# Patient Record
Sex: Female | Born: 1970 | Race: Black or African American | Hispanic: No | Marital: Single | Smoking: Never smoker
Health system: Southern US, Community
[De-identification: ages and names within clinical notes are randomized; demographics above are authoritative.]

## PROBLEM LIST (undated history)

## (undated) DIAGNOSIS — D649 Anemia, unspecified: Secondary | ICD-10-CM

## (undated) DIAGNOSIS — C801 Malignant (primary) neoplasm, unspecified: Secondary | ICD-10-CM

## (undated) DIAGNOSIS — Z5189 Encounter for other specified aftercare: Secondary | ICD-10-CM

## (undated) DIAGNOSIS — D219 Benign neoplasm of connective and other soft tissue, unspecified: Secondary | ICD-10-CM

## (undated) HISTORY — DX: Anemia, unspecified: D64.9

## (undated) HISTORY — DX: Benign neoplasm of connective and other soft tissue, unspecified: D21.9

## (undated) HISTORY — DX: Encounter for other specified aftercare: Z51.89

---

## 2013-03-03 DIAGNOSIS — E0500 Thyrotoxicosis with diffuse goiter without thyrotoxic crisis or storm: Secondary | ICD-10-CM | POA: Insufficient documentation

## 2014-04-08 DIAGNOSIS — D259 Leiomyoma of uterus, unspecified: Secondary | ICD-10-CM | POA: Insufficient documentation

## 2017-08-02 ENCOUNTER — Inpatient Hospital Stay: Payer: BC Managed Care – PPO | Attending: Gynecology | Admitting: Gynecology

## 2017-08-02 ENCOUNTER — Encounter: Payer: Self-pay | Admitting: Gynecology

## 2017-08-02 VITALS — BP 138/82 | HR 130 | Temp 98.2°F | Resp 20 | Ht 64.0 in | Wt 117.9 lb

## 2017-08-02 DIAGNOSIS — R110 Nausea: Secondary | ICD-10-CM | POA: Diagnosis not present

## 2017-08-02 DIAGNOSIS — D500 Iron deficiency anemia secondary to blood loss (chronic): Secondary | ICD-10-CM | POA: Insufficient documentation

## 2017-08-02 DIAGNOSIS — N920 Excessive and frequent menstruation with regular cycle: Secondary | ICD-10-CM | POA: Insufficient documentation

## 2017-08-02 DIAGNOSIS — C55 Malignant neoplasm of uterus, part unspecified: Secondary | ICD-10-CM

## 2017-08-02 DIAGNOSIS — Z79899 Other long term (current) drug therapy: Secondary | ICD-10-CM | POA: Insufficient documentation

## 2017-08-02 DIAGNOSIS — R634 Abnormal weight loss: Secondary | ICD-10-CM | POA: Insufficient documentation

## 2017-08-02 DIAGNOSIS — C530 Malignant neoplasm of endocervix: Secondary | ICD-10-CM | POA: Insufficient documentation

## 2017-08-02 DIAGNOSIS — C787 Secondary malignant neoplasm of liver and intrahepatic bile duct: Secondary | ICD-10-CM | POA: Insufficient documentation

## 2017-08-02 MED ORDER — PROCHLORPERAZINE MALEATE 10 MG PO TABS: 10.0000 mg | ORAL_TABLET | Freq: Four times a day (QID) | ORAL | 0 refills | Status: AC | PRN

## 2017-08-02 NOTE — Progress Notes (Signed)
Consult Note: Gyn-Onc   Stephanie Fuentes 47 y.o. female  No chief complaint on file.   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  On physical exam the patient appears to have very advanced disease.  Plan: In order to get a better understanding of the extent of her disease I recommend the patient have a CT scan of the chest abdomen and pelvis.  The patient seems to be aware that she has advanced disease and is accepting of the fact that her prognosis might be poor.  The patient given a prescription for Compazine due to her nausea.  Her pain seems to be adequately controlled with Tylenol and ibuprofen.   HPI: 47 year old African-American female seen in consultation at the request of Dr. Nyoka Cowden regarding management of a newly diagnosed poorly differentiated carcinoma.  The patient relates that she has had a long-standing history of uterine fibroids and irregular bleeding.  However in recent months she is also developed right lower quadrant pain and heavier bleeding.  Over the last several years she is deferred medical care because of lack of insurance but now she does have insurance.  She saw Dr. Nyoka Cowden on July 22, 2017 where she was found to have pelvic mass and a friable mass of tissue falling off of what we presume is the cervix.  This tissue has been interpreted as a poorly differentiated high-grade malignant neoplasm.  Further workup with an ultrasound shows the uterus measures 17 x 4 x 8 0.0 x 16.4 cm with multiple large uterine fibroids.  The endometrium measured 8 mm although because of anatomic distortion this was poorly visualized.  The ovaries could not be visualized.  There is no free fluid.  Also noted that above the umbilicus over to large soft tissue masses which are beyond the extent of pelvic imaging and appeared to be of hepatic origin the largest measuring 10.4 cm.  Aside from bleeding and pain the patient notes that she is lost 10 pounds over  the last few months.  Patient's had 4 pregnancies.  Pregnancy in 2016 resulted in a intrauterine fetal demise delivered by cesarean section.  Review of Systems:10 point review of systems is negative except as noted in interval history.   Vitals: Blood pressure 138/82, pulse (!) 130, temperature 98.2 F (36.8 C), temperature source Oral, resp. rate 20, height 5\' 4"  (1.626 m), weight 117 lb 14.4 oz (53.5 kg), SpO2 100 %.  Physical Exam: General : The patient is a slender somewhat cachectic African-American female HEENT: normocephalic, extraoccular movements normal; neck is supple without thyromegally  Lynphnodes: Supraclavicular and inguinal nodes not enlarged  Abdomen: Slightly distended with multiple masses.  There seems to be an aggregate of masses arising in the pelvis and extending above the umbilicus.  There is a prominent mass in the right lower quadrant.  In addition on percussion her liver is enlarged especially what I believe is the left lobe. Pelvic:  EGBUS: Normal female  Vagina: Normal, no lesions, there are no lesions visualized although speculum is deviated by a firm pelvic mass and the cervix could not be fully visualized either. Urethra and Bladder: Normal, non-tender  Cervix: On palpation, the cervix seems to be deviated posteriorly. Uterus: Replaced by a large pelvic mass that that in aggregate extends above the umbilicus.  This is only slightly mobile and tender.  Deep in the pelvis the mass compresses the bladder and deviates the cervix posteriorly.   Rectal: normal sphincter tone, mass protrudes posteriorly toward  the rectum.  No blood  Lower extremities: No edema or varicosities. Normal range of motion      Allergies  Allergen Reactions  . Benadryl [Diphenhydramine] Other (See Comments)    Makes her jittery    Past Medical History:  Diagnosis Date  . Anemia   . Fibroids     Past Surgical History:  Procedure Laterality Date  . CESAREAN SECTION  2016    Reason: IUFD, Fibroids    No current outpatient medications on file.   No current facility-administered medications for this visit.     Social History   Socioeconomic History  . Marital status: Single    Spouse name: Not on file  . Number of children: Not on file  . Years of education: Not on file  . Highest education level: Not on file  Social Needs  . Financial resource strain: Not on file  . Food insecurity - worry: Not on file  . Food insecurity - inability: Not on file  . Transportation needs - medical: Not on file  . Transportation needs - non-medical: Not on file  Occupational History  . Not on file  Tobacco Use  . Smoking status: Never Smoker  . Smokeless tobacco: Never Used  Substance and Sexual Activity  . Alcohol use: No    Frequency: Never  . Drug use: Not on file  . Sexual activity: Not on file  Other Topics Concern  . Not on file  Social History Narrative  . Not on file    Family History  Problem Relation Age of Onset  . Kidney cancer Mother   . Diabetes Mother   . Colon cancer Sister       Marti Sleigh, MD 08/02/2017, 1:19 PM

## 2017-08-02 NOTE — Patient Instructions (Signed)
Plan on having a CT scan on Tuesday and meet with Dr. Alycia Rossetti in the office on Wednesday to discuss the results and the plan moving forward.  Please call for any questions or concerns to [phone].

## 2017-08-06 ENCOUNTER — Ambulatory Visit (HOSPITAL_COMMUNITY)
Admission: RE | Admit: 2017-08-06 | Discharge: 2017-08-06 | Disposition: A | Discharge status: Home / Self Care | Payer: BC Managed Care – PPO | Source: Ambulatory Visit | Attending: Gynecologic Oncology | Admitting: Gynecologic Oncology

## 2017-08-06 ENCOUNTER — Encounter (HOSPITAL_COMMUNITY): Payer: Self-pay

## 2017-08-06 DIAGNOSIS — C55 Malignant neoplasm of uterus, part unspecified: Secondary | ICD-10-CM | POA: Insufficient documentation

## 2017-08-06 DIAGNOSIS — D259 Leiomyoma of uterus, unspecified: Secondary | ICD-10-CM | POA: Diagnosis not present

## 2017-08-06 DIAGNOSIS — R160 Hepatomegaly, not elsewhere classified: Secondary | ICD-10-CM | POA: Diagnosis not present

## 2017-08-06 HISTORY — DX: Malignant (primary) neoplasm, unspecified: C80.1

## 2017-08-06 MED ORDER — IOPAMIDOL (ISOVUE-300) INJECTION 61%
INTRAVENOUS | Status: AC
  Filled 2017-08-06: qty 100

## 2017-08-06 MED ORDER — IOPAMIDOL (ISOVUE-300) INJECTION 61%
100.0000 mL | Freq: Once | INTRAVENOUS | Status: AC | PRN
  Administered 2017-08-06: 100 mL via INTRAVENOUS

## 2017-08-06 NOTE — Progress Notes (Signed)
Consult Note: Gyn-Onc   Stephanie Fuentes 47 y.o. female  Chief Complaint  Patient presents with  . 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  I met with the patient, her mother, her son, and her sister today.  We reviewed the CT scan in detail line by line.  I discussed with the patient that I would recommend a percutaneous liver biopsy to confirm pregnancy of metastatic disease and referral to medical oncology for adjuvant therapy.  I would most likely start with paclitaxel and carboplatin for 3 cycles and then repeat imaging depending on how she tolerates it.  I do think that she is at potential risk for tumor lysis syndrome based on the volume of disease.  She had a liver biopsy did not reveal metastatic disease I would most likely still start with a neoadjuvant approach and then consider surgery in interval fashion.  The patient seems amenable to this.  The patient's sister is being treated for stage IV colon cancer.  I asked about genetic testing and she has not completely undergone all genetic testing as her daughter did not want to know the results.  I did advocate that the patient consider this.  The patient's son was with her today.  He was clearly very upset by his mother's diagnosis.  He would like for her to abandon all ideas of chemotherapy and be treated in a holistic approach.  He states this is that his literature would suggest that cancer is related to poor habits and environmental exposures and that chemotherapy will not help and that chemotherapy and radiation are the same thing.  I tried to answer his questions and explained the differences between chemotherapy and radiation.  I explained to the patient and her family that I am not opposed to her considering holistic options but if they let us know what they are considering we can ensure that it does not interact with chemotherapy in a negative way.  The  patient told her son that she would be making her own decisions and he became quite upset.  We will in tandem schedule her for a VIR guided biopsy of the liver as well as an appointment with Dr. Alvy Bimler.  To the best of my ability I tried to answer all of their questions today.  I do believe that the patient's questions were answered.  She did bring FMLA paperwork with her today that we will complete.  Greater than 45 minutes face-to-face time was spent with the patient and her family.  All of the time was spent in discussion and consultation.  HPI: 47 year old seen in consultation at the request of Dr. Nyoka Cowden regarding management of a newly diagnosed poorly differentiated carcinoma.  Patient is a 47 year old gravida 4 para 3 that was seen by Dr. Fermin Schwab last week.  Relates that she has had a long-standing history of uterine fibroids and irregular bleeding, however secondary to lack of insurance she was not able to seek care.Marland Kitchen  However in recent months she is also developed right lower quadrant pain and heavier bleeding.  Over the last several years she is deferred medical care because of lack of insurance but now she does have insurance.  She saw Dr. Nyoka Cowden on July 22, 2017 where she was found to have pelvic mass and a friable mass of tissue falling off of what we presume is the cervix. This tissue has been interpreted as a poorly differentiated high-grade malignant neoplasm.  Further workup with an ultrasound shows the uterus measures 17 x 4 x 8 0.0 x 16.4 cm with multiple large uterine fibroids.  The endometrium measured 8 mm although because of anatomic distortion this was poorly visualized.  The ovaries could not be visualized.  There is no free fluid.  Also noted that above the umbilicus over to large soft tissue masses which are beyond the extent of pelvic imaging and appeared to be of hepatic origin the largest measuring 10.4 cm.  Aside from bleeding and pain the patient notes that she is  lost 10 pounds over the last few months.  Patient's had 4 pregnancies.  Pregnancy in 2016 resulted in a intrauterine fetal demise delivered by cesarean section.  She was seen by Dr. Fermin Schwab on 08/02/17. A CT scan was ordered and she is scheduled today in follow up. CT ABDOMEN AND PELVIS FINDINGS  Hepatobiliary: Multiple large liver masses are seen in both right and left lobes. Largest mass in left hepatic lobe measures 12.2 x 9.6 cm on image 78/2. Largest mass in right lobe measures 9.3 x 7.1 cm on image 60/2. Majority of these larger masses are very similar in appearance, with central areas of necrosis or scarring.  Stomach/Bowel: No evidence of obstruction, inflammatory process, or abnormal fluid collections.  Vascular/Lymphatic: Bilateral external iliac lymphadenopathy is seen, measuring 1.6 cm on the right (image 104/2), and 1.4 cm on the left (image 102/2). Mild lymphadenopathy in the right common iliac chain measures 1.2 cm on image 90/2. No abdominal aortic aneurysm.  Reproductive: Multiple uterine fibroids are seen, several of which show cystic degeneration and peripheral calcification. Largest in the left lower uterine segment measures 9.7 cm. A heterogeneously enhancing mass is seen area of the cervix and upper vagina which measures 6.6 x 5.1 cm on image 111/2. This is highly suspicious for cervical carcinoma.  Ovoid cystic lesions are seen in both adnexal regions, measuring 4.7 cm on left and 5.5 cm on the right. Although higher than simple fluid attenuation, these lesions show no evidence of enhancing septations or soft tissue nodularity.  Musculoskeletal: No suspicious bone lesions identified.  IMPRESSION: 6.6 cm uterine mass in area of cervix and upper vagina, highly suspicious for cervical carcinoma.  Mild bilateral iliac lymphadenopathy, consistent with metastatic disease. No abdominal lymphadenopathy identified.  Multiple large liver masses, with areas of central  necrosis or scarring, highly suspicious for liver metastases. Although unlikely, it is conceivable that some of these lesions could represent other etiology such as focal nodular hyperplasia or adenomas. Consider further evaluation with PET-CT, abdomen MRI without and with contrast, or percutaneous needle biopsy.  Multiple large uterine fibroids, many showing cystic degeneration.  Bilateral cystic adnexal lesions with probably benign features.  No evidence of thoracic metastatic disease.  Allergies  Allergen Reactions  . Benadryl [Diphenhydramine] Other (See Comments)    Makes her jittery    Past Medical History:  Diagnosis Date  . Anemia   . Fibroids   . uterine ca dx'd 07/30/17    Past Surgical History:  Procedure Laterality Date  . CESAREAN SECTION  2016   Reason: IUFD, Fibroids    Current Outpatient Medications  Medication Sig Dispense Refill  . acetaminophen (TYLENOL) 325 MG tablet Take 650 mg by mouth every 6 (six) hours as needed.    . prochlorperazine (COMPAZINE) 10 MG tablet Take 1 tablet (10 mg total) by mouth every 6 (six) hours as needed for nausea or vomiting. 30 tablet 0   No current facility-administered  medications for this visit.     Social History   Socioeconomic History  . Marital status: Single    Spouse name: Not on file  . Number of children: Not on file  . Years of education: Not on file  . Highest education level: Not on file  Social Needs  . Financial resource strain: Not on file  . Food insecurity - worry: Not on file  . Food insecurity - inability: Not on file  . Transportation needs - medical: Not on file  . Transportation needs - non-medical: Not on file  Occupational History  . Not on file  Tobacco Use  . Smoking status: Never Smoker  . Smokeless tobacco: Never Used  Substance and Sexual Activity  . Alcohol use: No    Frequency: Never  . Drug use: No  . Sexual activity: Not on file  Other Topics Concern  . Not on file  Social  History Narrative  . Not on file    Family History  Problem Relation Age of Onset  . Kidney cancer Mother   . Diabetes Mother   . Colon cancer Sister     Vitals: Blood pressure 133/71, pulse (!) 125, temperature 99.4 F (37.4 C), temperature source Oral, resp. rate 20, last menstrual period 07/18/2017, SpO2 100 %.  Physical Exam: General : The patient is a slender somewhat cachectic African-American female       Millette Halberstam A., MD 08/07/2017, 3:34 PM

## 2017-08-07 ENCOUNTER — Inpatient Hospital Stay (HOSPITAL_BASED_OUTPATIENT_CLINIC_OR_DEPARTMENT_OTHER): Payer: BC Managed Care – PPO | Admitting: Gynecologic Oncology

## 2017-08-07 ENCOUNTER — Encounter: Payer: Self-pay | Admitting: Gynecologic Oncology

## 2017-08-07 VITALS — BP 133/71 | HR 125 | Temp 99.4°F | Resp 20

## 2017-08-07 DIAGNOSIS — R634 Abnormal weight loss: Secondary | ICD-10-CM | POA: Diagnosis not present

## 2017-08-07 DIAGNOSIS — R160 Hepatomegaly, not elsewhere classified: Secondary | ICD-10-CM

## 2017-08-07 DIAGNOSIS — N920 Excessive and frequent menstruation with regular cycle: Secondary | ICD-10-CM | POA: Diagnosis not present

## 2017-08-07 DIAGNOSIS — C530 Malignant neoplasm of endocervix: Secondary | ICD-10-CM

## 2017-08-07 DIAGNOSIS — C787 Secondary malignant neoplasm of liver and intrahepatic bile duct: Secondary | ICD-10-CM

## 2017-08-07 DIAGNOSIS — C539 Malignant neoplasm of cervix uteri, unspecified: Secondary | ICD-10-CM | POA: Insufficient documentation

## 2017-08-07 DIAGNOSIS — Z79899 Other long term (current) drug therapy: Secondary | ICD-10-CM | POA: Diagnosis not present

## 2017-08-07 DIAGNOSIS — C541 Malignant neoplasm of endometrium: Secondary | ICD-10-CM

## 2017-08-07 DIAGNOSIS — C55 Malignant neoplasm of uterus, part unspecified: Secondary | ICD-10-CM

## 2017-08-07 DIAGNOSIS — D500 Iron deficiency anemia secondary to blood loss (chronic): Secondary | ICD-10-CM | POA: Diagnosis not present

## 2017-08-07 NOTE — Patient Instructions (Signed)
You will receive a phone call from the hospital with an appointment to proceed with the liver biopsy.  We will also arrange for you to meet with Dr. Heath Lark, Medical Oncologist to discuss chemotherapy.

## 2017-08-09 ENCOUNTER — Other Ambulatory Visit: Payer: Self-pay | Admitting: Hematology and Oncology

## 2017-08-09 ENCOUNTER — Encounter: Payer: Self-pay | Admitting: Hematology and Oncology

## 2017-08-09 ENCOUNTER — Inpatient Hospital Stay: Payer: BC Managed Care – PPO

## 2017-08-09 ENCOUNTER — Other Ambulatory Visit: Payer: Self-pay | Admitting: *Deleted

## 2017-08-09 ENCOUNTER — Inpatient Hospital Stay (HOSPITAL_BASED_OUTPATIENT_CLINIC_OR_DEPARTMENT_OTHER): Payer: BC Managed Care – PPO | Admitting: Hematology and Oncology

## 2017-08-09 VITALS — BP 100/62 | HR 100 | Temp 98.5°F | Resp 18 | Ht 64.0 in | Wt 116.9 lb

## 2017-08-09 DIAGNOSIS — C787 Secondary malignant neoplasm of liver and intrahepatic bile duct: Secondary | ICD-10-CM | POA: Insufficient documentation

## 2017-08-09 DIAGNOSIS — E0500 Thyrotoxicosis with diffuse goiter without thyrotoxic crisis or storm: Secondary | ICD-10-CM

## 2017-08-09 DIAGNOSIS — R634 Abnormal weight loss: Secondary | ICD-10-CM

## 2017-08-09 DIAGNOSIS — C541 Malignant neoplasm of endometrium: Secondary | ICD-10-CM | POA: Diagnosis not present

## 2017-08-09 DIAGNOSIS — D500 Iron deficiency anemia secondary to blood loss (chronic): Secondary | ICD-10-CM

## 2017-08-09 DIAGNOSIS — D649 Anemia, unspecified: Secondary | ICD-10-CM

## 2017-08-09 DIAGNOSIS — N920 Excessive and frequent menstruation with regular cycle: Secondary | ICD-10-CM

## 2017-08-09 DIAGNOSIS — C530 Malignant neoplasm of endocervix: Secondary | ICD-10-CM | POA: Diagnosis not present

## 2017-08-09 LAB — CBC WITH DIFFERENTIAL/PLATELET
BASOS ABS: 0 10*3/uL (ref 0.0–0.1)
Basophils Relative: 0 %
EOS PCT: 0 %
Eosinophils Absolute: 0 10*3/uL (ref 0.0–0.5)
HEMATOCRIT: 22.6 % — AB (ref 34.8–46.6)
Hemoglobin: 6.2 g/dL — CL (ref 11.6–15.9)
LYMPHS PCT: 10 %
Lymphs Abs: 1 10*3/uL (ref 0.9–3.3)
MCH: 18 pg — AB (ref 25.1–34.0)
MCHC: 27.4 g/dL — AB (ref 31.5–36.0)
MCV: 65.7 fL — AB (ref 79.5–101.0)
MONO ABS: 0.8 10*3/uL (ref 0.1–0.9)
MONOS PCT: 8 %
NEUTROS ABS: 8.2 10*3/uL — AB (ref 1.5–6.5)
Neutrophils Relative %: 82 %
Platelets: 383 10*3/uL (ref 145–400)
RBC: 3.44 MIL/uL — ABNORMAL LOW (ref 3.70–5.45)
RDW: 18.9 % — AB (ref 11.2–16.1)
WBC: 10.1 10*3/uL (ref 3.9–10.3)

## 2017-08-09 LAB — IRON AND TIBC
IRON: 12 ug/dL — AB (ref 41–142)
SATURATION RATIOS: 3 % — AB (ref 21–57)
TIBC: 356 ug/dL (ref 236–444)
UIBC: 344 ug/dL

## 2017-08-09 LAB — COMPREHENSIVE METABOLIC PANEL
ALT: 14 U/L (ref 0–55)
ANION GAP: 14 — AB (ref 3–11)
AST: 47 U/L — ABNORMAL HIGH (ref 5–34)
Albumin: 3.5 g/dL (ref 3.5–5.0)
Alkaline Phosphatase: 198 U/L — ABNORMAL HIGH (ref 40–150)
BILIRUBIN TOTAL: 0.7 mg/dL (ref 0.2–1.2)
BUN: 8 mg/dL (ref 7–26)
CHLORIDE: 99 mmol/L (ref 98–109)
CO2: 21 mmol/L — ABNORMAL LOW (ref 22–29)
Calcium: 8.8 mg/dL (ref 8.4–10.4)
Creatinine, Ser: 0.71 mg/dL (ref 0.60–1.10)
Glucose, Bld: 89 mg/dL (ref 70–140)
POTASSIUM: 4.2 mmol/L (ref 3.3–4.7)
Sodium: 134 mmol/L — ABNORMAL LOW (ref 136–145)
TOTAL PROTEIN: 7.3 g/dL (ref 6.4–8.3)

## 2017-08-09 LAB — FERRITIN: Ferritin: 76 ng/mL (ref 9–269)

## 2017-08-09 LAB — TSH: TSH: 0.088 u[IU]/mL — ABNORMAL LOW (ref 0.308–3.960)

## 2017-08-09 LAB — VITAMIN B12: Vitamin B-12: 451 pg/mL (ref 180–914)

## 2017-08-09 LAB — LACTATE DEHYDROGENASE: LDH: 448 U/L — AB (ref 125–245)

## 2017-08-09 LAB — SAMPLE TO BLOOD BANK

## 2017-08-09 LAB — URIC ACID: Uric Acid, Serum: 5.3 mg/dL (ref 2.6–7.4)

## 2017-08-09 LAB — PROTIME-INR
INR: 1.11
PROTHROMBIN TIME: 14.2 s (ref 11.4–15.2)

## 2017-08-09 LAB — PREPARE RBC (CROSSMATCH)

## 2017-08-09 LAB — T4, FREE: Free T4: 0.89 ng/dL (ref 0.61–1.12)

## 2017-08-09 LAB — SEDIMENTATION RATE: SED RATE: 40 mm/h — AB (ref 0–22)

## 2017-08-09 LAB — APTT: APTT: 28 s (ref 24–36)

## 2017-08-09 MED ORDER — ACETAMINOPHEN 325 MG PO TABS
650.0000 mg | ORAL_TABLET | Freq: Once | ORAL | Status: AC
  Administered 2017-08-09: 650 mg via ORAL

## 2017-08-09 MED ORDER — SODIUM CHLORIDE 0.9 % IV SOLN: 250.0000 mL | Freq: Once | INTRAVENOUS | Status: DC

## 2017-08-09 NOTE — Assessment & Plan Note (Addendum)
She has history of Graves' disease treated with radioactive iodine treatment x2 I will order thyroid function test

## 2017-08-09 NOTE — Progress Notes (Signed)
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 female is here because of recent diagnosis of cancer.  Multiple family members are present. She has strong family history of malignancy. The patient had a stillborn in 2016.  Soon after cesarean section, she has significant heavy menstruation to the point she needed blood transfusion.  According to the patient, her hemoglobin was down to 5.  She frequently have periods lasting 7 days, stop 3 days and then with further bleeding.  The patient has been placed on birth control pill but it did not stop her menstruation.  Due to lack of insurance, she was not able to get further evaluation until she started to palpate right upper quadrant mass.  The masses were not painful.  She started to have early satiety with 10 pound weight loss. She was recently found to have severe iron deficiency anemia and was placed on oral iron supplement but it caused severe constipation.  The patient complained of pico She eat a variety of diet. Currently, she is not working.  Her son, Harrell Gave who is 47 year old lives with her.  She has another older son named Legrand Como who is aware of her situation. I have reviewed her records extensively and summarized as follows:   Uterine cancer ([place])   07/22/2017 Initial Diagnosis    The patient was examined in the wellness center.  Upon vaginal exam, friable mass of tissue presumably from the cervix was interpreted as poorly differentiated high-grade malignant neoplasm.  Follow-up workup with ultrasound of the uterus revealed multiple large uterine fibroids.      07/22/2017 Pathology Results    Biopsy confirmed poorly differentiated high-grade malignant neoplasm.      08/06/2017 Imaging    6.6 cm uterine mass  in area of cervix and upper vagina, highly suspicious for cervical carcinoma.  Mild bilateral iliac lymphadenopathy, consistent with metastatic disease. No abdominal lymphadenopathy identified.  Multiple large liver masses, with areas of central necrosis or scarring, highly suspicious for liver metastases. Although unlikely, it is conceivable that some of these lesions could represent other etiology such as focal nodular hyperplasia or adenomas. Consider further evaluation with PET-CT, abdomen MRI without and with contrast, or percutaneous needle biopsy.  Multiple large uterine fibroids, many showing cystic degeneration.  Bilateral cystic adnexal lesions with probably benign features. Differential diagnosis includes ovarian cysts, endometriomas, and hydrosalpinges. These could also be further assessed on PET-CT or MRI.  No evidence of thoracic metastatic disease.       At present time, she denies chest pain, shortness of breath but complaining of profound fatigue.  She  has occasional dizziness  MEDICAL HISTORY:  Past Medical History:  Diagnosis Date  . Anemia   . Blood transfusion without reported diagnosis   . Fibroids   . uterine ca dx'd 07/30/17    SURGICAL HISTORY: Past Surgical History:  Procedure Laterality Date  . CESAREAN SECTION  2016   Reason: IUFD, Fibroids    SOCIAL HISTORY: Social History   Socioeconomic History  . Marital status: Single    Spouse name: Not on file  . Number of children: Not on file  . Years of education: Not on file  . Highest education level: Not on file  Social Needs  . Financial resource strain: Not on file  . Food insecurity -  worry: Not on file  . Food insecurity - inability: Not on file  . Transportation needs - medical: Not on file  . Transportation needs - non-medical: Not on file  Occupational History  . Not on file  Tobacco Use  . Smoking status: Never Smoker  . Smokeless tobacco: Never Used  Substance and Sexual  Activity  . Alcohol use: No    Frequency: Never  . Drug use: No  . Sexual activity: Not on file  Other Topics Concern  . Not on file  Social History Narrative  . Not on file    FAMILY HISTORY: Family History  Problem Relation Age of Onset  . Kidney cancer Mother 73       kidney ca  . Diabetes Mother   . Colon cancer Sister 48       colon ca    ALLERGIES:  is allergic to benadryl [diphenhydramine].  MEDICATIONS:  Current Outpatient Medications  Medication Sig Dispense Refill  . acetaminophen (TYLENOL) 325 MG tablet Take 650 mg by mouth every 6 (six) hours as needed.    . prochlorperazine (COMPAZINE) 10 MG tablet Take 1 tablet (10 mg total) by mouth every 6 (six) hours as needed for nausea or vomiting. 30 tablet 0   No current facility-administered medications for this visit.    Facility-Administered Medications Ordered in Other Visits  Medication Dose Route Frequency Provider Last Rate Last Dose  . 0.9 %  sodium chloride infusion  250 mL Intravenous Once Heath Lark, MD        REVIEW OF SYSTEMS:   Constitutional: Denies fevers, chills or abnormal night sweats Eyes: Denies blurriness of vision, double vision or watery eyes Ears, nose, mouth, throat, and face: Denies mucositis or sore throat Respiratory: Denies cough, dyspnea or wheezes Cardiovascular: Denies palpitation, chest discomfort or lower extremity swelling Gastrointestinal:  Denies nausea, heartburn or change in bowel habits Skin: Denies abnormal skin rashes Lymphatics: Denies new lymphadenopathy or easy bruising Neurological:Denies numbness, tingling or new weaknesses Behavioral/Psych: Mood is stable, no new changes  All other systems were reviewed with the patient and are negative.  PHYSICAL EXAMINATION: ECOG PERFORMANCE STATUS: 2 - Symptomatic, <50% confined to bed  Vitals:   08/09/17 1233  BP: 100/62  Pulse: 100  Resp: 18  Temp: 98.5 F (36.9 C)  SpO2: 100%   Filed Weights   08/09/17 1233   Weight: 116 lb 14.4 oz (53 kg)    GENERAL:alert, no distress and comfortable.  She looks thin and cachectic SKIN: skin color, texture, turgor are normal, no rashes or significant lesions EYES: normal, conjunctiva are pink and non-injected, sclera clear OROPHARYNX:no exudate, no erythema and lips, buccal mucosa, and tongue normal  NECK: supple, thyroid normal size, non-tender, without nodularity LYMPH:  no palpable lymphadenopathy in the cervical, axillary or inguinal LUNGS: clear to auscultation and percussion with normal breathing effort HEART: regular rate & rhythm and no murmurs and no lower extremity edema ABDOMEN:abdomen soft, non-tender and normal bowel sounds.  Palpable right upper quadrant mass Musculoskeletal:no cyanosis of digits and no clubbing  PSYCH: alert & oriented x 3 with fluent speech NEURO: no focal motor/sensory deficits  LABORATORY DATA:  I have reviewed the data as listed Lab Results  Component Value Date   WBC 10.1 08/09/2017   HGB 6.2 (LL) 08/09/2017   HCT 22.6 (L) 08/09/2017   MCV 65.7 (L) 08/09/2017   PLT 383 08/09/2017   Recent Labs    08/09/17 1320  NA 134*  K 4.2  CL 99  CO2 21*  GLUCOSE 89  BUN 8  CREATININE 0.71  CALCIUM 8.8  GFRNONAA >60  GFRAA >60  PROT 7.3  ALBUMIN 3.5  AST 47*  ALT 14  ALKPHOS 198*  BILITOT 0.7    RADIOGRAPHIC STUDIES: I have reviewed imaging study with the patient and family I have personally reviewed the radiological images as listed and agreed with the findings in the report. Ct Chest W Contrast  Result Date: 08/06/2017 CLINICAL DATA:  Newly diagnosed high-grade uterine carcinoma. Staging. EXAM: CT CHEST, ABDOMEN, AND PELVIS WITH CONTRAST TECHNIQUE: Multidetector CT imaging of the chest, abdomen and pelvis was performed following the standard protocol during bolus administration of intravenous contrast. CONTRAST:  171mL ISOVUE-300 IOPAMIDOL (ISOVUE-300) INJECTION 61% COMPARISON:  None. FINDINGS: CT CHEST  FINDINGS Cardiovascular: No acute findings. Mediastinum/Lymph Nodes: No masses or pathologically enlarged lymph nodes identified. Lungs/Pleura: No pulmonary infiltrate or mass identified. No effusion present. Musculoskeletal:  No suspicious bone lesions identified. CT ABDOMEN AND PELVIS FINDINGS Hepatobiliary: Multiple large liver masses are seen in both right and left lobes. Largest mass in left hepatic lobe measures 12.2 x 9.6 cm on image 78/2. Largest mass in right lobe measures 9.3 x 7.1 cm on image 60/2. Majority of these larger masses are very similar in appearance, with central areas of necrosis or scarring. Gallbladder is unremarkable. No evidence of biliary ductal dilatation. Pancreas:  No mass or inflammatory changes. Spleen:  Within normal limits in size and appearance. Adrenals/Urinary tract:  No masses or hydronephrosis. Stomach/Bowel: No evidence of obstruction, inflammatory process, or abnormal fluid collections. Vascular/Lymphatic: Bilateral external iliac lymphadenopathy is seen, measuring 1.6 cm on the right (image 104/2), and 1.4 cm on the left (image 102/2). Mild lymphadenopathy in the right common iliac chain measures 1.2 cm on image 90/2. No abdominal aortic aneurysm. Reproductive: Multiple uterine fibroids are seen, several of which show cystic degeneration and peripheral calcification. Largest in the left lower uterine segment measures 9.7 cm. A heterogeneously enhancing mass is seen area of the cervix and upper vagina which measures 6.6 x 5.1 cm on image 111/2. This is highly suspicious for cervical carcinoma. Ovoid cystic lesions are seen in both adnexal regions, measuring 4.7 cm on left and 5.5 cm on the right. Although higher than simple fluid attenuation, these lesions show no evidence of enhancing septations or soft tissue nodularity. Other:  None. Musculoskeletal:  No suspicious bone lesions identified. IMPRESSION: 6.6 cm uterine mass in area of cervix and upper vagina, highly  suspicious for cervical carcinoma. Mild bilateral iliac lymphadenopathy, consistent with metastatic disease. No abdominal lymphadenopathy identified. Multiple large liver masses, with areas of central necrosis or scarring, highly suspicious for liver metastases. Although unlikely, it is conceivable that some of these lesions could represent other etiology such as focal nodular hyperplasia or adenomas. Consider further evaluation with PET-CT, abdomen MRI without and with contrast, or percutaneous needle biopsy. Multiple large uterine fibroids, many showing cystic degeneration. Bilateral cystic adnexal lesions with probably benign features. Differential diagnosis includes ovarian cysts, endometriomas, and hydrosalpinges. These could also be further assessed on PET-CT or MRI. No evidence of thoracic metastatic disease. Electronically Signed   By: Earle Gell M.D.   On: 08/06/2017 10:30   Ct Abdomen Pelvis W Contrast  Result Date: 08/06/2017 CLINICAL DATA:  Newly diagnosed high-grade uterine carcinoma. Staging. EXAM: CT CHEST, ABDOMEN, AND PELVIS WITH CONTRAST TECHNIQUE: Multidetector CT imaging of the chest, abdomen and pelvis was performed following the standard protocol during bolus administration of intravenous  contrast. CONTRAST:  148mL ISOVUE-300 IOPAMIDOL (ISOVUE-300) INJECTION 61% COMPARISON:  None. FINDINGS: CT CHEST FINDINGS Cardiovascular: No acute findings. Mediastinum/Lymph Nodes: No masses or pathologically enlarged lymph nodes identified. Lungs/Pleura: No pulmonary infiltrate or mass identified. No effusion present. Musculoskeletal:  No suspicious bone lesions identified. CT ABDOMEN AND PELVIS FINDINGS Hepatobiliary: Multiple large liver masses are seen in both right and left lobes. Largest mass in left hepatic lobe measures 12.2 x 9.6 cm on image 78/2. Largest mass in right lobe measures 9.3 x 7.1 cm on image 60/2. Majority of these larger masses are very similar in appearance, with central areas of  necrosis or scarring. Gallbladder is unremarkable. No evidence of biliary ductal dilatation. Pancreas:  No mass or inflammatory changes. Spleen:  Within normal limits in size and appearance. Adrenals/Urinary tract:  No masses or hydronephrosis. Stomach/Bowel: No evidence of obstruction, inflammatory process, or abnormal fluid collections. Vascular/Lymphatic: Bilateral external iliac lymphadenopathy is seen, measuring 1.6 cm on the right (image 104/2), and 1.4 cm on the left (image 102/2). Mild lymphadenopathy in the right common iliac chain measures 1.2 cm on image 90/2. No abdominal aortic aneurysm. Reproductive: Multiple uterine fibroids are seen, several of which show cystic degeneration and peripheral calcification. Largest in the left lower uterine segment measures 9.7 cm. A heterogeneously enhancing mass is seen area of the cervix and upper vagina which measures 6.6 x 5.1 cm on image 111/2. This is highly suspicious for cervical carcinoma. Ovoid cystic lesions are seen in both adnexal regions, measuring 4.7 cm on left and 5.5 cm on the right. Although higher than simple fluid attenuation, these lesions show no evidence of enhancing septations or soft tissue nodularity. Other:  None. Musculoskeletal:  No suspicious bone lesions identified. IMPRESSION: 6.6 cm uterine mass in area of cervix and upper vagina, highly suspicious for cervical carcinoma. Mild bilateral iliac lymphadenopathy, consistent with metastatic disease. No abdominal lymphadenopathy identified. Multiple large liver masses, with areas of central necrosis or scarring, highly suspicious for liver metastases. Although unlikely, it is conceivable that some of these lesions could represent other etiology such as focal nodular hyperplasia or adenomas. Consider further evaluation with PET-CT, abdomen MRI without and with contrast, or percutaneous needle biopsy. Multiple large uterine fibroids, many showing cystic degeneration. Bilateral cystic adnexal  lesions with probably benign features. Differential diagnosis includes ovarian cysts, endometriomas, and hydrosalpinges. These could also be further assessed on PET-CT or MRI. No evidence of thoracic metastatic disease. Electronically Signed   By: Earle Gell M.D.   On: 08/06/2017 10:30    ASSESSMENT & PLAN:  .Uterine cancer ([place]) The most likely cause of her cancer is due to undiagnosed uterine cancer She has strong family history of colon cancer in the family, raising the possibility of familial inheritable disorder such as Lynch syndrome The abnormality seen in the liver is highly suspicious for metastatic disease to the liver Her agree with the assessment by GYN oncologist to perform liver biopsy If confirmed metastatic endometrial cancer, we would add additional molecular testing and referral to see genetics I have also spoken with interventional radiologist for port placement She will be started with chemotherapy within 2 weeks after test results are available My plan would be to give her 3 cycles of carboplatin and Taxol before repeat imaging study I will get her case presented at the next GYN oncology tumor board In the meantime, I recommend supportive care  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[facility]) She is not symptomatic and denies pain I will order liver function study  for further follow-up Recommend liver biopsy to confirm metastatic disease and to add additional molecular study  Iron deficiency anemia due to chronic blood loss She has severe iron deficiency anemia and symptomatic She could not tolerate oral iron supplement due to severe constipation.  She is symptomatic from anemia. We discussed some of the risks, benefits, and alternatives of blood transfusions. The patient is symptomatic from anemia and the hemoglobin level is critically low.  Some of the side-effects to be expected including risks of transfusion reactions, chills, infection, syndrome of volume overload and risk of  hospitalization from various reasons and the patient is willing to proceed and went ahead to sign consent today. I will proceed with 1  unit of blood transfusion I plan to order intravenous iron infusion when I see her back   Graves disease She has history of Graves' disease treated with radioactive iodine treatment x2 I will order thyroid function test  Weight loss She has profound weight loss I encouraged her to increase oral intake as tolerated  Orders Placed This Encounter  Procedures  . IR FLUORO GUIDE PORT INSERTION RIGHT    Standing Status:   Future    Standing Expiration Date:   10/08/2018    Order Specific Question:   Reason for Exam (SYMPTOM  OR DIAGNOSIS REQUIRED)    Answer:   need port for chemo    Order Specific Question:   Preferred Imaging Location?    Answer:   [facility]  . CBC with Differential/Platelet    Standing Status:   Future    Number of Occurrences:   1    Standing Expiration Date:   09/13/2018  . Comprehensive metabolic panel    Standing Status:   Future    Number of Occurrences:   1    Standing Expiration Date:   09/13/2018  . Sedimentation rate    Standing Status:   Future    Number of Occurrences:   1    Standing Expiration Date:   09/13/2018  . Vitamin B12    Standing Status:   Future    Number of Occurrences:   1    Standing Expiration Date:   09/13/2018  . Ferritin    Standing Status:   Future    Number of Occurrences:   1    Standing Expiration Date:   09/13/2018  . Iron and TIBC    Standing Status:   Future    Number of Occurrences:   1    Standing Expiration Date:   09/13/2018  . Lactate dehydrogenase    Standing Status:   Future    Number of Occurrences:   1    Standing Expiration Date:   09/13/2018  . Uric acid    Standing Status:   Future    Number of Occurrences:   1    Standing Expiration Date:   09/13/2018  . Hepatitis B core antibody, IgM    Standing Status:   Future    Number of Occurrences:   1    Standing  Expiration Date:   09/13/2018  . Hepatitis B surface antibody    Standing Status:   Future    Number of Occurrences:   1    Standing Expiration Date:   09/13/2018  . Hepatitis B surface antigen    Standing Status:   Future    Number of Occurrences:   1    Standing Expiration Date:   09/13/2018  . Hepatitis C antibody (  reflex if positive)    Standing Status:   Future    Number of Occurrences:   1    Standing Expiration Date:   09/13/2018  . TSH    Standing Status:   Future    Number of Occurrences:   1    Standing Expiration Date:   09/13/2018  . T4, free    Standing Status:   Future    Number of Occurrences:   1    Standing Expiration Date:   09/13/2018  . CA 125    Standing Status:   Future    Number of Occurrences:   1    Standing Expiration Date:   09/13/2018  . APTT    Standing Status:   Future    Number of Occurrences:   1    Standing Expiration Date:   09/13/2018  . Protime-INR    Standing Status:   Future    Number of Occurrences:   1    Standing Expiration Date:   09/13/2018  . Sample to Blood Bank    Standing Status:   Standing    Number of Occurrences:   33    Standing Expiration Date:   09/13/2018   All questions were answered. The patient knows to call the clinic with any problems, questions or concerns. I spent 60 minutes counseling the patient face to face. The total time spent in the appointment was 90 minutes and more than 50% was on counseling.     Heath Lark, MD 08/09/2017 7:46 PM

## 2017-08-09 NOTE — Progress Notes (Signed)
START ON PATHWAY REGIMEN - Uterine  Other Clinical Trial: carboplatin taxol  Patient Characteristics: High Grade Undifferentiated/Leiomyosarcoma, Medically Inoperable, First Line, Stage III, IV, Symptomatic AJCC N Category: N1 Patient Status: Medically Inoperable AJCC M Category: M1 AJCC 8 Stage Grouping: IVB AJCC T Category: T4 Line of therapy: First Line Would you be surprised if this patient died  in the next year<= I would be surprised if this patient died in the next year Asymptomatic or Symptomatic<= Symptomatic Intent of Therapy: Curative Intent, Not Discussed with Patient

## 2017-08-09 NOTE — Assessment & Plan Note (Addendum)
The most likely cause of her cancer is due to undiagnosed uterine cancer She has strong family history of colon cancer in the family, raising the possibility of familial inheritable disorder such as Lynch syndrome The abnormality seen in the liver is highly suspicious for metastatic disease to the liver Her agree with the assessment by GYN oncologist to perform liver biopsy If confirmed metastatic endometrial cancer, we would add additional molecular testing and referral to see genetics I have also spoken with interventional radiologist for port placement She will be started with chemotherapy within 2 weeks after test results are available My plan would be to give her 3 cycles of carboplatin and Taxol before repeat imaging study I will get her case presented at the next GYN oncology tumor board In the meantime, I recommend supportive care

## 2017-08-09 NOTE — Progress Notes (Signed)
DISCONTINUE ON PATHWAY REGIMEN - Uterine  Other Clinical Trial: carboplatin taxol  REASON: Other Reason PRIOR TREATMENT: Other Trial - carboplatin taxol TREATMENT RESPONSE: Unable to Evaluate  START OFF PATHWAY REGIMEN - Uterine   [id number]:Carboplatin + Paclitaxel (5/175) q21 Days:   A cycle is every 21 days:     Paclitaxel      Carboplatin   **Always confirm dose/schedule in your pharmacy ordering system**    Patient Characteristics: High Grade Undifferentiated/Leiomyosarcoma, Medically Inoperable, First Line, Stage III, IV, Symptomatic AJCC N Category: N1 Patient Status: Medically Inoperable AJCC M Category: M1 AJCC 8 Stage Grouping: IVB AJCC T Category: T4 Line of therapy: First Line Would you be surprised if this patient died  in the next year<= I would be surprised if this patient died in the next year Asymptomatic or Symptomatic<= Symptomatic Intent of Therapy: Non-Curative / Palliative Intent, Discussed with Patient

## 2017-08-09 NOTE — Assessment & Plan Note (Signed)
She has severe iron deficiency anemia and symptomatic She could not tolerate oral iron supplement due to severe constipation.  She is symptomatic from anemia. We discussed some of the risks, benefits, and alternatives of blood transfusions. The patient is symptomatic from anemia and the hemoglobin level is critically low.  Some of the side-effects to be expected including risks of transfusion reactions, chills, infection, syndrome of volume overload and risk of hospitalization from various reasons and the patient is willing to proceed and went ahead to sign consent today. I will proceed with 1  unit of blood transfusion I plan to order intravenous iron infusion when I see her back

## 2017-08-09 NOTE — Assessment & Plan Note (Signed)
She is not symptomatic and denies pain I will order liver function study for further follow-up Recommend liver biopsy to confirm metastatic disease and to add additional molecular study

## 2017-08-09 NOTE — Assessment & Plan Note (Signed)
She has profound weight loss I encouraged her to increase oral intake as tolerated

## 2017-08-09 NOTE — Patient Instructions (Signed)

## 2017-08-10 LAB — CA 125: CANCER ANTIGEN (CA) 125: 79.2 U/mL — AB (ref 0.0–38.1)

## 2017-08-10 LAB — HEPATITIS B CORE ANTIBODY, IGM: Hep B C IgM: NEGATIVE

## 2017-08-10 LAB — TYPE AND SCREEN
ABO/RH(D): O POS
Antibody Screen: NEGATIVE
Unit division: 0

## 2017-08-10 LAB — BPAM RBC
BLOOD PRODUCT EXPIRATION DATE: 201902162359
ISSUE DATE / TIME: 201901181621
UNIT TYPE AND RH: 5100

## 2017-08-10 LAB — HEPATITIS B SURFACE ANTIBODY,QUALITATIVE: Hep B S Ab: REACTIVE

## 2017-08-10 LAB — ABO/RH: ABO/RH(D): O POS

## 2017-08-10 LAB — HCV COMMENT:

## 2017-08-10 LAB — HEPATITIS C ANTIBODY (REFLEX): HCV Ab: <0.1 s/co ratio (ref 0.0–0.9)

## 2017-08-10 LAB — HEPATITIS B SURFACE ANTIGEN: Hepatitis B Surface Ag: NEGATIVE

## 2017-08-13 ENCOUNTER — Telehealth: Payer: Self-pay | Admitting: Hematology and Oncology

## 2017-08-13 ENCOUNTER — Other Ambulatory Visit: Payer: Self-pay | Admitting: Radiology

## 2017-08-13 NOTE — Telephone Encounter (Signed)
Left message for patient regarding upcoming January appointment updates.

## 2017-08-14 ENCOUNTER — Encounter (HOSPITAL_COMMUNITY): Payer: Self-pay

## 2017-08-14 ENCOUNTER — Encounter: Payer: Self-pay | Admitting: *Deleted

## 2017-08-14 ENCOUNTER — Ambulatory Visit (HOSPITAL_COMMUNITY)
Admission: RE | Admit: 2017-08-14 | Discharge: 2017-08-14 | Disposition: A | Discharge status: Home / Self Care | Payer: BC Managed Care – PPO | Source: Ambulatory Visit | Attending: Gynecologic Oncology | Admitting: Gynecologic Oncology

## 2017-08-14 DIAGNOSIS — Z9889 Other specified postprocedural states: Secondary | ICD-10-CM | POA: Insufficient documentation

## 2017-08-14 DIAGNOSIS — Z888 Allergy status to other drugs, medicaments and biological substances status: Secondary | ICD-10-CM | POA: Insufficient documentation

## 2017-08-14 DIAGNOSIS — Z8051 Family history of malignant neoplasm of kidney: Secondary | ICD-10-CM | POA: Diagnosis not present

## 2017-08-14 DIAGNOSIS — C55 Malignant neoplasm of uterus, part unspecified: Secondary | ICD-10-CM | POA: Diagnosis not present

## 2017-08-14 DIAGNOSIS — C787 Secondary malignant neoplasm of liver and intrahepatic bile duct: Secondary | ICD-10-CM | POA: Diagnosis not present

## 2017-08-14 DIAGNOSIS — Z800 Family history of malignant neoplasm of digestive organs: Secondary | ICD-10-CM | POA: Insufficient documentation

## 2017-08-14 DIAGNOSIS — R160 Hepatomegaly, not elsewhere classified: Secondary | ICD-10-CM | POA: Diagnosis present

## 2017-08-14 DIAGNOSIS — Z833 Family history of diabetes mellitus: Secondary | ICD-10-CM | POA: Diagnosis not present

## 2017-08-14 DIAGNOSIS — Z8542 Personal history of malignant neoplasm of other parts of uterus: Secondary | ICD-10-CM | POA: Insufficient documentation

## 2017-08-14 LAB — COMPREHENSIVE METABOLIC PANEL
ALBUMIN: 3.2 g/dL — AB (ref 3.5–5.0)
ALT: 19 U/L (ref 14–54)
AST: 70 U/L — AB (ref 15–41)
Alkaline Phosphatase: 179 U/L — ABNORMAL HIGH (ref 38–126)
Anion gap: 12 (ref 5–15)
BUN: 8 mg/dL (ref 6–20)
CHLORIDE: 99 mmol/L — AB (ref 101–111)
CO2: 22 mmol/L (ref 22–32)
Calcium: 8.4 mg/dL — ABNORMAL LOW (ref 8.9–10.3)
Creatinine, Ser: 0.54 mg/dL (ref 0.44–1.00)
GFR calc Af Amer: >60 mL/min (ref >60)
GFR calc non Af Amer: >60 mL/min (ref >60)
GLUCOSE: 85 mg/dL (ref 65–99)
POTASSIUM: 4.1 mmol/L (ref 3.5–5.1)
Sodium: 133 mmol/L — ABNORMAL LOW (ref 135–145)
Total Bilirubin: 0.8 mg/dL (ref 0.3–1.2)
Total Protein: 6.8 g/dL (ref 6.5–8.1)

## 2017-08-14 LAB — CBC WITH DIFFERENTIAL/PLATELET
BASOS ABS: 0 10*3/uL (ref 0.0–0.1)
Basophils Relative: 0 %
Eosinophils Absolute: 0 10*3/uL (ref 0.0–0.7)
Eosinophils Relative: 0 %
HCT: 24.9 % — ABNORMAL LOW (ref 36.0–46.0)
Hemoglobin: 7.3 g/dL — ABNORMAL LOW (ref 12.0–15.0)
LYMPHS ABS: 0.8 10*3/uL (ref 0.7–4.0)
Lymphocytes Relative: 9 %
MCH: 20.4 pg — ABNORMAL LOW (ref 26.0–34.0)
MCHC: 29.3 g/dL — ABNORMAL LOW (ref 30.0–36.0)
MCV: 69.6 fL — ABNORMAL LOW (ref 78.0–100.0)
MONO ABS: 0.9 10*3/uL (ref 0.1–1.0)
MONOS PCT: 10 %
NEUTROS PCT: 81 %
Neutro Abs: 7.2 10*3/uL (ref 1.7–7.7)
PLATELETS: 299 10*3/uL (ref 150–400)
RBC: 3.58 MIL/uL — AB (ref 3.87–5.11)
RDW: 22.8 % — AB (ref 11.5–15.5)
WBC: 8.9 10*3/uL (ref 4.0–10.5)

## 2017-08-14 MED ORDER — LIDOCAINE HCL (PF) 1 % IJ SOLN
INTRAMUSCULAR | Status: AC | PRN
  Administered 2017-08-14: 20 mL

## 2017-08-14 MED ORDER — GELATIN ABSORBABLE 12-7 MM EX MISC
CUTANEOUS | Status: AC
  Filled 2017-08-14: qty 1

## 2017-08-14 MED ORDER — MIDAZOLAM HCL 2 MG/2ML IJ SOLN
INTRAMUSCULAR | Status: AC
  Filled 2017-08-14: qty 2

## 2017-08-14 MED ORDER — SODIUM CHLORIDE 0.9 % IV SOLN
INTRAVENOUS | Status: DC
  Administered 2017-08-14: 11:00:00 via INTRAVENOUS

## 2017-08-14 MED ORDER — LIDOCAINE HCL 1 % IJ SOLN
INTRAMUSCULAR | Status: AC
  Filled 2017-08-14: qty 10

## 2017-08-14 MED ORDER — MIDAZOLAM HCL 2 MG/2ML IJ SOLN
INTRAMUSCULAR | Status: AC | PRN
  Administered 2017-08-14 (×2): 1 mg via INTRAVENOUS

## 2017-08-14 MED ORDER — OXYCODONE HCL 5 MG PO TABS
5.0000 mg | ORAL_TABLET | ORAL | Status: DC | PRN
  Filled 2017-08-14: qty 1

## 2017-08-14 MED ORDER — FENTANYL CITRATE (PF) 100 MCG/2ML IJ SOLN
INTRAMUSCULAR | Status: AC
  Filled 2017-08-14: qty 2

## 2017-08-14 MED ORDER — FENTANYL CITRATE (PF) 100 MCG/2ML IJ SOLN
INTRAMUSCULAR | Status: AC | PRN
  Administered 2017-08-14 (×2): 50 ug via INTRAVENOUS

## 2017-08-14 NOTE — Procedures (Signed)
US guided core biopsies of left hepatic lesion, 5 cores obtained.  Gelfoam placed in biopsy tract.  Minimal blood loss and no immediate complication.

## 2017-08-14 NOTE — Discharge Instructions (Signed)
You may take off dressing and bathe in 24 hours.   Moderate Conscious Sedation, Adult, Care After These instructions provide you with information about caring for yourself after your procedure. Your health care provider may also give you more specific instructions. Your treatment has been planned according to current medical practices, but problems sometimes occur. Call your health care provider if you have any problems or questions after your procedure. What can I expect after the procedure? After your procedure, it is common:  To feel sleepy for several hours.  To feel clumsy and have poor balance for several hours.  To have poor judgment for several hours.  To vomit if you eat too soon.  Follow these instructions at home: For at least 24 hours after the procedure:   Do not: ? Participate in activities where you could fall or become injured. ? Drive. ? Use heavy machinery. ? Drink alcohol. ? Take sleeping pills or medicines that cause drowsiness. ? Make important decisions or sign legal documents. ? Take care of children on your own.  Rest. Eating and drinking  Follow the diet recommended by your health care provider.  If you vomit: ? Drink water, juice, or soup when you can drink without vomiting. ? Make sure you have little or no nausea before eating solid foods. General instructions  Have a responsible adult stay with you until you are awake and alert.  Take over-the-counter and prescription medicines only as told by your health care provider.  If you smoke, do not smoke without supervision.  Keep all follow-up visits as told by your health care provider. This is important. Contact a health care provider if:  You keep feeling nauseous or you keep vomiting.  You feel light-headed.  You develop a rash.  You have a fever. Get help right away if:  You have trouble breathing. This information is not intended to replace advice given to you by your health care  provider. Make sure you discuss any questions you have with your health care provider. Document Released: 04/29/2013 Document Revised: 12/12/2015 Document Reviewed: 10/29/2015 Elsevier Interactive Patient Education  2018 [employer].    Liver Biopsy, Care After These instructions give you information on caring for yourself after your procedure. Your doctor may also give you more specific instructions. Call your doctor if you have any problems or questions after your procedure. Follow these instructions at home:  Rest at home for 1-2 days or as told by your doctor.  Have someone stay with you for at least 24 hours.  Do not do these things in the first 24 hours: ? Drive. ? Use machinery. ? Take care of other people. ? Sign legal documents. ? Take a bath or shower.  There are many different ways to close and cover a cut (incision). For example, a cut can be closed with stitches, skin glue, or adhesive strips. Follow your doctor's instructions on: ? Taking care of your cut. ? Changing and removing your bandage (dressing). ? Removing whatever was used to close your cut.  Do not drink alcohol in the first week.  Do not lift more than 5 pounds or play contact sports for the first 2 weeks.  Take medicines only as told by your doctor. For 1 week, do not take medicine that has aspirin in it or medicines like ibuprofen.  Get your test results. Contact a doctor if:  A cut bleeds and leaves more than just a small spot of blood.  A cut is red,  puffs up (swells), or hurts more than before.  Fluid or something else comes from a cut.  A cut smells bad.  You have a fever or chills. Get help right away if:  You have swelling, bloating, or pain in your belly (abdomen).  You get dizzy or faint.  You have a rash.  You feel sick to your stomach (nauseous) or throw up (vomit).  You have trouble breathing, feel short of breath, or feel faint.  Your chest hurts.  You have problems  talking or seeing.  You have trouble balancing or moving your arms or legs. This information is not intended to replace advice given to you by your health care provider. Make sure you discuss any questions you have with your health care provider. Document Released: 04/17/2008 Document Revised: 12/15/2015 Document Reviewed: 09/04/2013 Elsevier Interactive Patient Education  [employer].

## 2017-08-14 NOTE — Consult Note (Signed)
Chief Complaint: Patient was seen in consultation today for image guided liver lesion biopsy  Referring Physician(s): Cross,Melissa D/Gehrig,P  Supervising Physician: Markus Daft  Patient Status: [facility] - Out-pt  History of Present Illness: Stephanie Fuentes is a 47 y.o. female with history of recently diagnosed high-grade uterine cancer and latest imaging revealing mild bilateral iliac lymphadenopathy, multiple large liver lesions, multiple large uterine fibroids, and bilateral cystic adnexal lesions.  She presents today for image guided liver lesion biopsy for further evaluation.  Past Medical History:  Diagnosis Date  . Anemia   . Blood transfusion without reported diagnosis   . Fibroids   . uterine ca dx'd 07/30/17    Past Surgical History:  Procedure Laterality Date  . CESAREAN SECTION  2016   Reason: IUFD, Fibroids    Allergies: Benadryl [diphenhydramine]  Medications: Prior to Admission medications   Medication Sig Start Date End Date Taking? Authorizing Provider  acetaminophen (TYLENOL) 325 MG tablet Take 650 mg by mouth every 6 (six) hours as needed.   Yes [provider]  prochlorperazine (COMPAZINE) 10 MG tablet Take 1 tablet (10 mg total) by mouth every 6 (six) hours as needed for nausea or vomiting. 08/02/17  Yes Cross, Carollee Massed, NP     Family History  Problem Relation Age of Onset  . Kidney cancer Mother 22       kidney ca  . Diabetes Mother   . Colon cancer Sister 46       colon ca    Social History   Socioeconomic History  . Marital status: Single    Spouse name: None  . Number of children: None  . Years of education: None  . Highest education level: None  Social Needs  . Financial resource strain: None  . Food insecurity - worry: None  . Food insecurity - inability: None  . Transportation needs - medical: None  . Transportation needs - non-medical: None  Occupational History  . None  Tobacco Use  . Smoking status: Never Smoker  .  Smokeless tobacco: Never Used  Substance and Sexual Activity  . Alcohol use: No    Frequency: Never  . Drug use: No  . Sexual activity: None  Other Topics Concern  . None  Social History Narrative  . None      Review of Systems currently denies fever, headache, chest pain, dyspnea, cough, back pain, vomiting.  She does have abdominal bloating, intermittent nausea, weight loss and vaginal bleeding  Vital Signs: BP 100/64 (BP Location: Right Arm)   Pulse 97   Temp 98 F (36.7 C) (Oral)   Resp 16   LMP 07/18/2017 (Approximate)   SpO2 100%   Physical Exam awake, alert.  Chest with distant but clear breath sounds bilaterally.  Heart with regular rate and rhythm.  Abdomen with midline tenderness and a palpable mass,?hepatic origin, positive bowel sounds; no significant lower extremity edema  Imaging: Ct Chest W Contrast  Result Date: 08/06/2017 CLINICAL DATA:  Newly diagnosed high-grade uterine carcinoma. Staging. EXAM: CT CHEST, ABDOMEN, AND PELVIS WITH CONTRAST TECHNIQUE: Multidetector CT imaging of the chest, abdomen and pelvis was performed following the standard protocol during bolus administration of intravenous contrast. CONTRAST:  113mL ISOVUE-300 IOPAMIDOL (ISOVUE-300) INJECTION 61% COMPARISON:  None. FINDINGS: CT CHEST FINDINGS Cardiovascular: No acute findings. Mediastinum/Lymph Nodes: No masses or pathologically enlarged lymph nodes identified. Lungs/Pleura: No pulmonary infiltrate or mass identified. No effusion present. Musculoskeletal:  No suspicious bone lesions identified. CT ABDOMEN AND PELVIS FINDINGS Hepatobiliary:  Multiple large liver masses are seen in both right and left lobes. Largest mass in left hepatic lobe measures 12.2 x 9.6 cm on image 78/2. Largest mass in right lobe measures 9.3 x 7.1 cm on image 60/2. Majority of these larger masses are very similar in appearance, with central areas of necrosis or scarring. Gallbladder is unremarkable. No evidence of biliary  ductal dilatation. Pancreas:  No mass or inflammatory changes. Spleen:  Within normal limits in size and appearance. Adrenals/Urinary tract:  No masses or hydronephrosis. Stomach/Bowel: No evidence of obstruction, inflammatory process, or abnormal fluid collections. Vascular/Lymphatic: Bilateral external iliac lymphadenopathy is seen, measuring 1.6 cm on the right (image 104/2), and 1.4 cm on the left (image 102/2). Mild lymphadenopathy in the right common iliac chain measures 1.2 cm on image 90/2. No abdominal aortic aneurysm. Reproductive: Multiple uterine fibroids are seen, several of which show cystic degeneration and peripheral calcification. Largest in the left lower uterine segment measures 9.7 cm. A heterogeneously enhancing mass is seen area of the cervix and upper vagina which measures 6.6 x 5.1 cm on image 111/2. This is highly suspicious for cervical carcinoma. Ovoid cystic lesions are seen in both adnexal regions, measuring 4.7 cm on left and 5.5 cm on the right. Although higher than simple fluid attenuation, these lesions show no evidence of enhancing septations or soft tissue nodularity. Other:  None. Musculoskeletal:  No suspicious bone lesions identified. IMPRESSION: 6.6 cm uterine mass in area of cervix and upper vagina, highly suspicious for cervical carcinoma. Mild bilateral iliac lymphadenopathy, consistent with metastatic disease. No abdominal lymphadenopathy identified. Multiple large liver masses, with areas of central necrosis or scarring, highly suspicious for liver metastases. Although unlikely, it is conceivable that some of these lesions could represent other etiology such as focal nodular hyperplasia or adenomas. Consider further evaluation with PET-CT, abdomen MRI without and with contrast, or percutaneous needle biopsy. Multiple large uterine fibroids, many showing cystic degeneration. Bilateral cystic adnexal lesions with probably benign features. Differential diagnosis includes  ovarian cysts, endometriomas, and hydrosalpinges. These could also be further assessed on PET-CT or MRI. No evidence of thoracic metastatic disease. Electronically Signed   By: Earle Gell M.D.   On: 08/06/2017 10:30   Ct Abdomen Pelvis W Contrast  Result Date: 08/06/2017 CLINICAL DATA:  Newly diagnosed high-grade uterine carcinoma. Staging. EXAM: CT CHEST, ABDOMEN, AND PELVIS WITH CONTRAST TECHNIQUE: Multidetector CT imaging of the chest, abdomen and pelvis was performed following the standard protocol during bolus administration of intravenous contrast. CONTRAST:  142mL ISOVUE-300 IOPAMIDOL (ISOVUE-300) INJECTION 61% COMPARISON:  None. FINDINGS: CT CHEST FINDINGS Cardiovascular: No acute findings. Mediastinum/Lymph Nodes: No masses or pathologically enlarged lymph nodes identified. Lungs/Pleura: No pulmonary infiltrate or mass identified. No effusion present. Musculoskeletal:  No suspicious bone lesions identified. CT ABDOMEN AND PELVIS FINDINGS Hepatobiliary: Multiple large liver masses are seen in both right and left lobes. Largest mass in left hepatic lobe measures 12.2 x 9.6 cm on image 78/2. Largest mass in right lobe measures 9.3 x 7.1 cm on image 60/2. Majority of these larger masses are very similar in appearance, with central areas of necrosis or scarring. Gallbladder is unremarkable. No evidence of biliary ductal dilatation. Pancreas:  No mass or inflammatory changes. Spleen:  Within normal limits in size and appearance. Adrenals/Urinary tract:  No masses or hydronephrosis. Stomach/Bowel: No evidence of obstruction, inflammatory process, or abnormal fluid collections. Vascular/Lymphatic: Bilateral external iliac lymphadenopathy is seen, measuring 1.6 cm on the right (image 104/2), and 1.4 cm on the  left (image 102/2). Mild lymphadenopathy in the right common iliac chain measures 1.2 cm on image 90/2. No abdominal aortic aneurysm. Reproductive: Multiple uterine fibroids are seen, several of which show  cystic degeneration and peripheral calcification. Largest in the left lower uterine segment measures 9.7 cm. A heterogeneously enhancing mass is seen area of the cervix and upper vagina which measures 6.6 x 5.1 cm on image 111/2. This is highly suspicious for cervical carcinoma. Ovoid cystic lesions are seen in both adnexal regions, measuring 4.7 cm on left and 5.5 cm on the right. Although higher than simple fluid attenuation, these lesions show no evidence of enhancing septations or soft tissue nodularity. Other:  None. Musculoskeletal:  No suspicious bone lesions identified. IMPRESSION: 6.6 cm uterine mass in area of cervix and upper vagina, highly suspicious for cervical carcinoma. Mild bilateral iliac lymphadenopathy, consistent with metastatic disease. No abdominal lymphadenopathy identified. Multiple large liver masses, with areas of central necrosis or scarring, highly suspicious for liver metastases. Although unlikely, it is conceivable that some of these lesions could represent other etiology such as focal nodular hyperplasia or adenomas. Consider further evaluation with PET-CT, abdomen MRI without and with contrast, or percutaneous needle biopsy. Multiple large uterine fibroids, many showing cystic degeneration. Bilateral cystic adnexal lesions with probably benign features. Differential diagnosis includes ovarian cysts, endometriomas, and hydrosalpinges. These could also be further assessed on PET-CT or MRI. No evidence of thoracic metastatic disease. Electronically Signed   By: Earle Gell M.D.   On: 08/06/2017 10:30    Labs:  CBC: Recent Labs    08/09/17 1320  WBC 10.1  HGB 6.2*  HCT 22.6*  PLT 383    COAGS: Recent Labs    08/09/17 1320  INR 1.11  APTT 28    BMP: Recent Labs    08/09/17 1320  NA 134*  K 4.2  CL 99  CO2 21*  GLUCOSE 89  BUN 8  CALCIUM 8.8  CREATININE 0.71  GFRNONAA >60  GFRAA >60    LIVER FUNCTION TESTS: Recent Labs    08/09/17 1320  BILITOT  0.7  AST 47*  ALT 14  ALKPHOS 198*  PROT 7.3  ALBUMIN 3.5    TUMOR MARKERS: No results for input(s): AFPTM, CEA, CA199, CHROMGRNA in the last 8760 hours.  Assessment and Plan: 47 y.o. female with history of recently diagnosed high-grade uterine cancer and latest imaging revealing mild bilateral iliac lymphadenopathy, multiple large liver lesions, multiple large uterine fibroids, and bilateral cystic adnexal lesions.  She presents today for image guided liver lesion biopsy for further evaluation.Risks and benefits discussed with the patient including, but not limited to bleeding, infection, damage to adjacent structures or low yield requiring additional tests.All of the patient's questions were answered, patient is agreeable to proceed.Consent signed and in chart.Labs pend.      Thank you for this interesting consult.  I greatly enjoyed meeting Danniell Fuentes and look forward to participating in their care.  A copy of this report was sent to the requesting provider on this date.  Electronically Signed: D. Rowe Robert, PA-C 08/14/2017, 11:42 AM   I spent a total of  25 minutes   in face to face in clinical consultation, greater than 50% of which was counseling/coordinating care for image guided liver lesion biopsy

## 2017-08-16 ENCOUNTER — Other Ambulatory Visit: Payer: Self-pay | Admitting: *Deleted

## 2017-08-16 DIAGNOSIS — D500 Iron deficiency anemia secondary to blood loss (chronic): Secondary | ICD-10-CM

## 2017-08-16 DIAGNOSIS — E0500 Thyrotoxicosis with diffuse goiter without thyrotoxic crisis or storm: Secondary | ICD-10-CM

## 2017-08-16 DIAGNOSIS — C787 Secondary malignant neoplasm of liver and intrahepatic bile duct: Secondary | ICD-10-CM

## 2017-08-16 DIAGNOSIS — C548 Malignant neoplasm of overlapping sites of corpus uteri: Secondary | ICD-10-CM

## 2017-08-19 ENCOUNTER — Inpatient Hospital Stay: Payer: BC Managed Care – PPO

## 2017-08-19 ENCOUNTER — Other Ambulatory Visit: Payer: Self-pay | Admitting: Hematology and Oncology

## 2017-08-19 ENCOUNTER — Inpatient Hospital Stay (HOSPITAL_BASED_OUTPATIENT_CLINIC_OR_DEPARTMENT_OTHER): Payer: BC Managed Care – PPO | Admitting: Hematology and Oncology

## 2017-08-19 ENCOUNTER — Ambulatory Visit: Payer: BC Managed Care – PPO | Admitting: Hematology and Oncology

## 2017-08-19 ENCOUNTER — Encounter: Payer: Self-pay | Admitting: Gynecologic Oncology

## 2017-08-19 ENCOUNTER — Other Ambulatory Visit: Payer: BC Managed Care – PPO

## 2017-08-19 ENCOUNTER — Encounter: Payer: Self-pay | Admitting: Hematology and Oncology

## 2017-08-19 ENCOUNTER — Telehealth: Payer: Self-pay | Admitting: Hematology and Oncology

## 2017-08-19 ENCOUNTER — Other Ambulatory Visit: Payer: Self-pay | Admitting: Radiology

## 2017-08-19 VITALS — BP 119/64 | HR 110 | Temp 98.0°F | Resp 18 | Ht 64.0 in | Wt 119.5 lb

## 2017-08-19 DIAGNOSIS — R634 Abnormal weight loss: Secondary | ICD-10-CM

## 2017-08-19 DIAGNOSIS — E0500 Thyrotoxicosis with diffuse goiter without thyrotoxic crisis or storm: Secondary | ICD-10-CM

## 2017-08-19 DIAGNOSIS — C530 Malignant neoplasm of endocervix: Secondary | ICD-10-CM

## 2017-08-19 DIAGNOSIS — N920 Excessive and frequent menstruation with regular cycle: Secondary | ICD-10-CM | POA: Diagnosis not present

## 2017-08-19 DIAGNOSIS — D500 Iron deficiency anemia secondary to blood loss (chronic): Secondary | ICD-10-CM

## 2017-08-19 DIAGNOSIS — Z79899 Other long term (current) drug therapy: Secondary | ICD-10-CM | POA: Diagnosis not present

## 2017-08-19 DIAGNOSIS — C541 Malignant neoplasm of endometrium: Secondary | ICD-10-CM

## 2017-08-19 DIAGNOSIS — C787 Secondary malignant neoplasm of liver and intrahepatic bile duct: Secondary | ICD-10-CM

## 2017-08-19 DIAGNOSIS — Z7189 Other specified counseling: Secondary | ICD-10-CM

## 2017-08-19 DIAGNOSIS — C548 Malignant neoplasm of overlapping sites of corpus uteri: Secondary | ICD-10-CM

## 2017-08-19 LAB — CBC WITH DIFFERENTIAL (CANCER CENTER ONLY)
BASOS PCT: 1 %
Basophils Absolute: 0.1 10*3/uL (ref 0.0–0.1)
EOS ABS: 0 10*3/uL (ref 0.0–0.5)
EOS PCT: 0 %
HCT: 22.4 % — ABNORMAL LOW (ref 34.8–46.6)
Hemoglobin: 6.8 g/dL — CL (ref 11.6–15.9)
LYMPHS ABS: 0.7 10*3/uL — AB (ref 0.9–3.3)
Lymphocytes Relative: 7 %
MCH: 20.5 pg — AB (ref 25.1–34.0)
MCHC: 30.3 g/dL — ABNORMAL LOW (ref 31.5–36.0)
MCV: 67.6 fL — ABNORMAL LOW (ref 79.5–101.0)
MONOS PCT: 9 %
Monocytes Absolute: 0.9 10*3/uL (ref 0.1–0.9)
Neutro Abs: 7.8 10*3/uL — ABNORMAL HIGH (ref 1.5–6.5)
Neutrophils Relative %: 83 %
PLATELETS: 285 10*3/uL (ref 145–400)
RBC: 3.31 MIL/uL — ABNORMAL LOW (ref 3.70–5.45)
RDW: 24.7 % — ABNORMAL HIGH (ref 11.2–16.1)
WBC: 9.4 10*3/uL (ref 3.9–10.3)

## 2017-08-19 LAB — CMP (CANCER CENTER ONLY)
ALBUMIN: 3.1 g/dL — AB (ref 3.5–5.0)
ALT: 15 U/L (ref 0–55)
AST: 60 U/L — ABNORMAL HIGH (ref 5–34)
Alkaline Phosphatase: 222 U/L — ABNORMAL HIGH (ref 40–150)
Anion gap: 12 — ABNORMAL HIGH (ref 3–11)
BUN: 5 mg/dL — ABNORMAL LOW (ref 7–26)
CHLORIDE: 98 mmol/L (ref 98–109)
CO2: 23 mmol/L (ref 22–29)
Calcium: 8.7 mg/dL (ref 8.4–10.4)
Creatinine: 0.6 mg/dL (ref 0.60–1.10)
GFR, Estimated: >60 mL/min (ref >60)
Glucose, Bld: 102 mg/dL (ref 70–140)
POTASSIUM: 3.2 mmol/L — AB (ref 3.3–4.7)
SODIUM: 133 mmol/L — AB (ref 136–145)
Total Bilirubin: 0.6 mg/dL (ref 0.2–1.2)
Total Protein: 6.6 g/dL (ref 6.4–8.3)

## 2017-08-19 LAB — URIC ACID: Uric Acid, Serum: 3.9 mg/dL (ref 2.6–7.4)

## 2017-08-19 LAB — SAMPLE TO BLOOD BANK

## 2017-08-19 LAB — LACTATE DEHYDROGENASE: LDH: 578 U/L — AB (ref 125–245)

## 2017-08-19 LAB — PREPARE RBC (CROSSMATCH)

## 2017-08-19 MED ORDER — ONDANSETRON HCL 8 MG PO TABS: 8.0000 mg | ORAL_TABLET | Freq: Two times a day (BID) | ORAL | 1 refills | Status: DC | PRN

## 2017-08-19 MED ORDER — DIPHENHYDRAMINE HCL 25 MG PO CAPS: 25.0000 mg | ORAL_CAPSULE | Freq: Once | ORAL | Status: DC

## 2017-08-19 MED ORDER — HEPARIN SOD (PORK) LOCK FLUSH 100 UNIT/ML IV SOLN
250.0000 [IU] | INTRAVENOUS | Status: DC | PRN
  Filled 2017-08-19: qty 5

## 2017-08-19 MED ORDER — LIDOCAINE-PRILOCAINE 2.5-2.5 % EX CREA: TOPICAL_CREAM | CUTANEOUS | 3 refills | Status: DC

## 2017-08-19 MED ORDER — PROCHLORPERAZINE MALEATE 10 MG PO TABS: 10.0000 mg | ORAL_TABLET | Freq: Four times a day (QID) | ORAL | 1 refills | Status: DC | PRN

## 2017-08-19 MED ORDER — SODIUM CHLORIDE 0.9 % IV SOLN: 250.0000 mL | Freq: Once | INTRAVENOUS | Status: DC

## 2017-08-19 MED ORDER — ACETAMINOPHEN 325 MG PO TABS
650.0000 mg | ORAL_TABLET | Freq: Once | ORAL | Status: AC
  Administered 2017-08-19: 650 mg via ORAL

## 2017-08-19 NOTE — Telephone Encounter (Signed)
Gave patient avs and calendar with appts per 1/28 los.  °

## 2017-08-19 NOTE — Progress Notes (Signed)
[facility] OFF[facility] PROGRESS NOTE  Patient Care Team: Avel Sensor, MD as PCP - General (Obstetrics and Gynecology)  SUMMARY OF ONCOLOGIC HISTORY:   Cervical cancer [facility])   07/22/2017 Initial Diagnosis    The patient was examined in the wellness center.  Upon vaginal exam, friable mass of tissue presumably from the cervix was interpreted as poorly differentiated high-grade malignant neoplasm.  Follow-up workup with ultrasound of the uterus revealed multiple large uterine fibroids.      07/22/2017 Pathology Results    Biopsy confirmed poorly differentiated high-grade malignant neoplasm.      08/06/2017 Imaging    6.6 cm uterine mass in area of cervix and upper vagina, highly suspicious for cervical carcinoma.  Mild bilateral iliac lymphadenopathy, consistent with metastatic disease. No abdominal lymphadenopathy identified.  Multiple large liver masses, with areas of central necrosis or scarring, highly suspicious for liver metastases. Although unlikely, it is conceivable that some of these lesions could represent other etiology such as focal nodular hyperplasia or adenomas. Consider further evaluation with PET-CT, abdomen MRI without and with contrast, or percutaneous needle biopsy.  Multiple large uterine fibroids, many showing cystic degeneration.  Bilateral cystic adnexal lesions with probably benign features. Differential diagnosis includes ovarian cysts, endometriomas, and hydrosalpinges. These could also be further assessed on PET-CT or MRI.  No evidence of thoracic metastatic disease.       08/09/2017 Tumor Marker    Patient's tumor was tested for the following markers: CA-125 Results of the tumor marker test revealed 79.2      08/14/2017 Pathology Results    Liver, needle/core biopsy, left hepatic lobe - METASTATIC POORLY DIFFERENTIATED CARCINOMA, SEE COMMENT. Microscopic Comment The tumor is poorly differentiated with abundant necrosis.  Pancytokeratin, PAX-8, cytokeratin 7, and p16 are positive. There is weak non-specific staining with TTF-1, synaptophysin (very focal), p63 (very focal). Chromogranin, CD56, S100, desmin, SMA, ER, CD10, cytokeratin 10, cytokeratin 5/6, and cytokeratin 20 are negative. Thus, the immunoprofile is suggest of a gynecologic primary. Dr. Lyndon Code has reviewed the case.        INTERVAL HISTORY: Please see below for problem oriented charting. She returns for chemotherapy consent She received further blood transfusion due to persistent anemia and she denies pain Appetite is stable No dizziness or fainting episodes She has ongoing vaginal bleeding  REVIEW OF SYSTEMS:   Constitutional: Denies fevers, chills or abnormal weight loss Eyes: Denies blurriness of vision Ears, nose, mouth, throat, and face: Denies mucositis or sore throat Respiratory: Denies cough, dyspnea or wheezes Cardiovascular: Denies palpitation, chest discomfort or lower extremity swelling Gastrointestinal:  Denies nausea, heartburn or change in bowel habits Skin: Denies abnormal skin rashes Lymphatics: Denies new lymphadenopathy or easy bruising Neurological:Denies numbness, tingling or new weaknesses Behavioral/Psych: Mood is stable, no new changes  All other systems were reviewed with the patient and are negative.  I have reviewed the past medical history, past surgical history, social history and family history with the patient and they are unchanged from previous note.  ALLERGIES:  is allergic to benadryl [diphenhydramine].  MEDICATIONS:  Current Outpatient Medications  Medication Sig Dispense Refill  . acetaminophen (TYLENOL) 325 MG tablet Take 650 mg by mouth every 6 (six) hours as needed.    . lidocaine-prilocaine (EMLA) cream Apply to affected area once 30 g 3  . ondansetron (ZOFRAN) 8 MG tablet Take 1 tablet (8 mg total) by mouth 2 (two) times daily as needed. Start on the third day after chemotherapy. 30 tablet 1  .  prochlorperazine (COMPAZINE) 10 MG tablet Take 1 tablet (10 mg total) by mouth every 6 (six) hours as needed for nausea or vomiting. 30 tablet 0  . prochlorperazine (COMPAZINE) 10 MG tablet Take 1 tablet (10 mg total) by mouth every 6 (six) hours as needed (Nausea or vomiting). 30 tablet 1   No current facility-administered medications for this visit.     PHYSICAL EXAMINATION: ECOG PERFORMANCE STATUS: 1 - Symptomatic but completely ambulatory  Vitals:   08/19/17 1212  BP: 119/64  Pulse: (!) 110  Resp: 18  Temp: 98 F (36.7 C)  SpO2: 100%   Filed Weights   08/19/17 1212  Weight: 119 lb 8 oz (54.2 kg)    GENERAL:alert, no distress and comfortable NEURO: alert & oriented x 3 with fluent speech, no focal motor/sensory deficits  LABORATORY DATA:  I have reviewed the data as listed    Component Value Date/Time   NA 133 (L) 08/19/2017 0728   K 3.2 (L) 08/19/2017 0728   CL 98 08/19/2017 0728   CO2 23 08/19/2017 0728   GLUCOSE 102 08/19/2017 0728   BUN 5 (L) 08/19/2017 0728   CREATININE 0.54 08/14/2017 1116   CALCIUM 8.7 08/19/2017 0728   PROT 6.6 08/19/2017 0728   ALBUMIN 3.1 (L) 08/19/2017 0728   AST 60 (H) 08/19/2017 0728   ALT 15 08/19/2017 0728   ALKPHOS 222 (H) 08/19/2017 0728   BILITOT 0.6 08/19/2017 0728   GFRNONAA >60 08/19/2017 0728   GFRAA >60 08/19/2017 0728    No results found for: SPEP, UPEP  Lab Results  Component Value Date   WBC 9.4 08/19/2017   NEUTROABS 7.8 (H) 08/19/2017   HGB 7.3 (L) 08/14/2017   HCT 22.4 (L) 08/19/2017   MCV 67.6 (L) 08/19/2017   PLT 285 08/19/2017      Chemistry      Component Value Date/Time   NA 133 (L) 08/19/2017 0728   K 3.2 (L) 08/19/2017 0728   CL 98 08/19/2017 0728   CO2 23 08/19/2017 0728   BUN 5 (L) 08/19/2017 0728   CREATININE 0.54 08/14/2017 1116      Component Value Date/Time   CALCIUM 8.7 08/19/2017 0728   ALKPHOS 222 (H) 08/19/2017 0728   AST 60 (H) 08/19/2017 0728   ALT 15 08/19/2017 0728    BILITOT 0.6 08/19/2017 0728       RADIOGRAPHIC STUDIES: Case is reviewed at the tumor board today I have personally reviewed the radiological images as listed and agreed with the findings in the report. Ct Chest W Contrast  Result Date: 08/06/2017 CLINICAL DATA:  Newly diagnosed high-grade uterine carcinoma. Staging. EXAM: CT CHEST, ABDOMEN, AND PELVIS WITH CONTRAST TECHNIQUE: Multidetector CT imaging of the chest, abdomen and pelvis was performed following the standard protocol during bolus administration of intravenous contrast. CONTRAST:  114mL ISOVUE-300 IOPAMIDOL (ISOVUE-300) INJECTION 61% COMPARISON:  None. FINDINGS: CT CHEST FINDINGS Cardiovascular: No acute findings. Mediastinum/Lymph Nodes: No masses or pathologically enlarged lymph nodes identified. Lungs/Pleura: No pulmonary infiltrate or mass identified. No effusion present. Musculoskeletal:  No suspicious bone lesions identified. CT ABDOMEN AND PELVIS FINDINGS Hepatobiliary: Multiple large liver masses are seen in both right and left lobes. Largest mass in left hepatic lobe measures 12.2 x 9.6 cm on image 78/2. Largest mass in right lobe measures 9.3 x 7.1 cm on image 60/2. Majority of these larger masses are very similar in appearance, with central areas of necrosis or scarring. Gallbladder is unremarkable. No evidence of biliary ductal dilatation. Pancreas:  No mass or inflammatory changes. Spleen:  Within normal limits in size and appearance. Adrenals/Urinary tract:  No masses or hydronephrosis. Stomach/Bowel: No evidence of obstruction, inflammatory process, or abnormal fluid collections. Vascular/Lymphatic: Bilateral external iliac lymphadenopathy is seen, measuring 1.6 cm on the right (image 104/2), and 1.4 cm on the left (image 102/2). Mild lymphadenopathy in the right common iliac chain measures 1.2 cm on image 90/2. No abdominal aortic aneurysm. Reproductive: Multiple uterine fibroids are seen, several of which show cystic degeneration  and peripheral calcification. Largest in the left lower uterine segment measures 9.7 cm. A heterogeneously enhancing mass is seen area of the cervix and upper vagina which measures 6.6 x 5.1 cm on image 111/2. This is highly suspicious for cervical carcinoma. Ovoid cystic lesions are seen in both adnexal regions, measuring 4.7 cm on left and 5.5 cm on the right. Although higher than simple fluid attenuation, these lesions show no evidence of enhancing septations or soft tissue nodularity. Other:  None. Musculoskeletal:  No suspicious bone lesions identified. IMPRESSION: 6.6 cm uterine mass in area of cervix and upper vagina, highly suspicious for cervical carcinoma. Mild bilateral iliac lymphadenopathy, consistent with metastatic disease. No abdominal lymphadenopathy identified. Multiple large liver masses, with areas of central necrosis or scarring, highly suspicious for liver metastases. Although unlikely, it is conceivable that some of these lesions could represent other etiology such as focal nodular hyperplasia or adenomas. Consider further evaluation with PET-CT, abdomen MRI without and with contrast, or percutaneous needle biopsy. Multiple large uterine fibroids, many showing cystic degeneration. Bilateral cystic adnexal lesions with probably benign features. Differential diagnosis includes ovarian cysts, endometriomas, and hydrosalpinges. These could also be further assessed on PET-CT or MRI. No evidence of thoracic metastatic disease. Electronically Signed   By: Earle Gell M.D.   On: 08/06/2017 10:30   Ct Abdomen Pelvis W Contrast  Result Date: 08/06/2017 CLINICAL DATA:  Newly diagnosed high-grade uterine carcinoma. Staging. EXAM: CT CHEST, ABDOMEN, AND PELVIS WITH CONTRAST TECHNIQUE: Multidetector CT imaging of the chest, abdomen and pelvis was performed following the standard protocol during bolus administration of intravenous contrast. CONTRAST:  156mL ISOVUE-300 IOPAMIDOL (ISOVUE-300) INJECTION 61%  COMPARISON:  None. FINDINGS: CT CHEST FINDINGS Cardiovascular: No acute findings. Mediastinum/Lymph Nodes: No masses or pathologically enlarged lymph nodes identified. Lungs/Pleura: No pulmonary infiltrate or mass identified. No effusion present. Musculoskeletal:  No suspicious bone lesions identified. CT ABDOMEN AND PELVIS FINDINGS Hepatobiliary: Multiple large liver masses are seen in both right and left lobes. Largest mass in left hepatic lobe measures 12.2 x 9.6 cm on image 78/2. Largest mass in right lobe measures 9.3 x 7.1 cm on image 60/2. Majority of these larger masses are very similar in appearance, with central areas of necrosis or scarring. Gallbladder is unremarkable. No evidence of biliary ductal dilatation. Pancreas:  No mass or inflammatory changes. Spleen:  Within normal limits in size and appearance. Adrenals/Urinary tract:  No masses or hydronephrosis. Stomach/Bowel: No evidence of obstruction, inflammatory process, or abnormal fluid collections. Vascular/Lymphatic: Bilateral external iliac lymphadenopathy is seen, measuring 1.6 cm on the right (image 104/2), and 1.4 cm on the left (image 102/2). Mild lymphadenopathy in the right common iliac chain measures 1.2 cm on image 90/2. No abdominal aortic aneurysm. Reproductive: Multiple uterine fibroids are seen, several of which show cystic degeneration and peripheral calcification. Largest in the left lower uterine segment measures 9.7 cm. A heterogeneously enhancing mass is seen area of the cervix and upper vagina which measures 6.6 x 5.1 cm on image  111/2. This is highly suspicious for cervical carcinoma. Ovoid cystic lesions are seen in both adnexal regions, measuring 4.7 cm on left and 5.5 cm on the right. Although higher than simple fluid attenuation, these lesions show no evidence of enhancing septations or soft tissue nodularity. Other:  None. Musculoskeletal:  No suspicious bone lesions identified. IMPRESSION: 6.6 cm uterine mass in area of  cervix and upper vagina, highly suspicious for cervical carcinoma. Mild bilateral iliac lymphadenopathy, consistent with metastatic disease. No abdominal lymphadenopathy identified. Multiple large liver masses, with areas of central necrosis or scarring, highly suspicious for liver metastases. Although unlikely, it is conceivable that some of these lesions could represent other etiology such as focal nodular hyperplasia or adenomas. Consider further evaluation with PET-CT, abdomen MRI without and with contrast, or percutaneous needle biopsy. Multiple large uterine fibroids, many showing cystic degeneration. Bilateral cystic adnexal lesions with probably benign features. Differential diagnosis includes ovarian cysts, endometriomas, and hydrosalpinges. These could also be further assessed on PET-CT or MRI. No evidence of thoracic metastatic disease. Electronically Signed   By: Earle Gell M.D.   On: 08/06/2017 10:30   US Biopsy (liver)  Result Date: 08/14/2017 INDICATION: 47 year old with uterine cancer and large liver masses. EXAM: ULTRASOUND-GUIDED LIVER LESION BIOPSY MEDICATIONS: None. ANESTHESIA/SEDATION: Moderate (conscious) sedation was employed during this procedure. A total of Versed 2.0 mg and Fentanyl 100 mcg was administered intravenously. Moderate Sedation Time: 20 minutes. The patient's level of consciousness and vital signs were monitored continuously by radiology nursing throughout the procedure under my direct supervision. FLUOROSCOPY TIME:  None COMPLICATIONS: None immediate. PROCEDURE: Informed written consent was obtained from the patient after a thorough discussion of the procedural risks, benefits and alternatives. All questions were addressed. A timeout was performed prior to the initiation of the procedure. Liver was evaluated with ultrasound. Two large lesions were identified in left hepatic lobe. The more cephalad lesion was targeted for biopsy. Left side of the abdomen was prepped with  chlorhexidine and a sterile field was created. Skin and soft tissues were anesthetized with 1% lidocaine. 78 gauge coaxial needle was directed into the lesion with ultrasound guidance while traversing normal liver tissue. A total of 5 core biopsies were obtained with an 18 gauge device. Specimens placed in formalin. Gel-Foam slurry was injected through the 17 gauge needle as it was removed. Bandage placed over the puncture site. FINDINGS: Numerous large hepatic lesions. Core biopsies obtained from the more cephalad left hepatic lesion. No evidence for bleeding or hematoma formation. IMPRESSION: Ultrasound-guided core biopsies of a left hepatic lesion. Electronically Signed   By: Markus Daft M.D.   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 She understood this is a stage IV disease and it is not curable She is scheduled for port placement Unfortunately, due to the long duration of treatment, she will not start chemotherapy this week She will continue to come back this Friday for chemo education class and blood transfusion I will start her chemo next week I will bring her back the following week for supportive care I will not give her Avastin for cycle 1 due to ongoing bleeding We can consolidate her future treatment with radiation therapy if needed  The treatment recommendation is based on NCCN guidelines She understood that the  intent of treatment is strictly palliative in nature  Improved Survival with Bevacizumab in Advanced Cervical Cancer Krishnansu S. Jenna Luo, M.D., Hyacinth Meeker, Ph.D., Sheppard Coil, M.D., Chanda Busing. Reed Breech, M.D., Glenice Laine, M.S., Vivianne Spence. Rickard Patience, M.D., Vilinda Blanks. Carmin Muskrat, M.D., Nelida Gores, M.D., Costella Hatcher, M.D., Cleone Slim. Toney Reil, M.D.,  Alberteen Sam. Legrand Como, M.D., and Theodoro Clock, M.D  Alison Stalling J Med [phone]. DOI: [id number]  Background Vascular endothelial growth factor (VEGF) promotes angiogenesis, a mediator of disease progression in cervical cancer. Bevacizumab, a humanized anti-VEGF monoclonal antibody, has single-agent activity in previously treated, recurrent disease. Most patients in whom recurrent cervical cancer develops have previously received cisplatin with radiation therapy, which reduces the effectiveness of cisplatin at the time of recurrence. We evaluated the effectiveness of bevacizumab and nonplatinum combination chemotherapy in patients with recurrent, persistent, or metastatic cervical cancer. Methods Using a 2-by-2 factorial design, we randomly assigned 452 patients to chemotherapy with or without bevacizumab at a dose of 15 mg per kilogram of body weight. Chemotherapy consisted of cisplatin at a dose of 50 mg per square meter of body-surface area, plus paclitaxel at a dose of 135 or 175 mg per square meter or topotecan at a dose of 0.75 mg per square meter on days 1 to 3, plus paclitaxel at a dose of 175 mg per square meter on day 1. Cycles were repeated every 21 days until disease progression, the development of unacceptable toxic effects, or a complete response was documented. The primary end point was overall survival; a reduction of 30% in the hazard ratio for death was considered clinically important. Results Groups were well balanced with respect to age, histologic findings, performance status, previous use or nonuse of a radiosensitizing platinum agent, and disease status. Topotecan?paclitaxel was not superior to cisplatin?paclitaxel (hazard ratio for death, 1.20). With the data for the two chemotherapy regimens combined, the addition of bevacizumab to chemotherapy was associated with increased overall survival (17.0 months vs. 13.3 months; hazard ratio for death, 0.71; 98%  confidence interval, 0.54 to 0.95; P = 0.004 in a one-sided test) and higher response rates (48% vs. 36%, P = 0.008). Bevacizumab, as compared with chemotherapy alone, was associated with an increased incidence of hypertension of grade 2 or higher (25% vs. 2%), thromboembolic events of grade 3 or higher (8% vs. 1%), and gastrointestinal fistulas of grade 3 or higher (3% vs. 0%). Conclusions The addition of bevacizumab to combination chemotherapy in patients with recurrent, persistent, or metastatic cervical cancer was associated with an improvement of 3.7 months in median overall survival. (Funded by the [employer]; [employer].gov number, [id number].)  We discussed some of the risks, benefits, side-effects of cisplatin, Taxol & Avastin. Treatment is intravenous, every 3 weeks x 6 cycles  Some of the short term side-effects included, though not limited to, including weight loss, life threatening infections, bleeding, blood clots, risk of allergic reactions, need for transfusions of blood products, nausea, vomiting, change in bowel habits, loss of hair, admission to hospital for various reasons, and risks of death.   Long term side-effects are also discussed including risks of infertility, permanent damage to nerve function, hearing loss, chronic fatigue, kidney damage with possibility needing hemodialysis, and rare secondary malignancy including bone marrow disorders.  The patient is aware that the response rates discussed earlier is not guaranteed.  After a long discussion, patient made an informed decision to proceed with the prescribed plan of care.   Patient education material was dispensed.  Iron deficiency anemia due to chronic blood loss She has severe bleeding She will received 1 unit of blood transfusion today She would receive further blood transfusion and of the week Once her blood count is stable, we will give her intravenous iron infusion We  discussed some of the risks, benefits, and alternatives of blood transfusions. The patient is symptomatic from anemia and the hemoglobin level is critically low.  Some of the side-effects to be expected including risks of transfusion reactions, chills, infection, syndrome of volume overload and risk of hospitalization from various reasons and the patient is willing to proceed and went ahead to sign consent today.   Goals of care, counseling/discussion She understood goals of care is strictly palliative in nature.   Orders Placed This Encounter  Procedures  . CBC with Differential (Cancer Center Only)    Standing Status:   Standing    Number of Occurrences:   20    Standing Expiration Date:   08/19/2018  . CMP ([place] only)    Standing Status:   Standing    Number of Occurrences:   20    Standing Expiration Date:   08/19/2018   All questions were answered. The patient knows to call the clinic with any problems, questions or concerns. No barriers to learning was detected. I spent 40 minutes counseling the patient face to face. The total time spent in the appointment was 55 minutes and more than 50% was on counseling and review of test results     Heath Lark, MD 08/19/2017 5:31 PM

## 2017-08-19 NOTE — Assessment & Plan Note (Signed)
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 She understood this is a stage IV disease and it is not curable She is scheduled for port placement Unfortunately, due to the long duration of treatment, she will not start chemotherapy this week She will continue to come back this Friday for chemo education class and blood transfusion I will start her chemo next week I will bring her back the following week for supportive care I will not give her Avastin for cycle 1 due to ongoing bleeding We can consolidate her future treatment with radiation therapy if needed  The treatment recommendation is based on NCCN guidelines She understood that the intent of treatment is strictly palliative in nature  Improved Survival with Bevacizumab in Advanced Cervical Cancer Krishnansu S. Jenna Luo, M.D., Hyacinth Meeker, Ph.D., Sheppard Coil, M.D., Chanda Busing. Reed Breech, M.D., Glenice Laine, M.S., Vivianne Spence. Rickard Patience, M.D., Vilinda Blanks. Carmin Muskrat, M.D., Nelida Gores, M.D., Costella Hatcher, M.D., Cleone Slim. Toney Reil, M.D., Alberteen Sam. Legrand Como, M.D., and Theodoro Clock, M.D  Alison Stalling J Med [phone]. DOI: [id number]  Background Vascular endothelial growth factor (VEGF) promotes angiogenesis, a mediator of disease progression in cervical cancer. Bevacizumab, a humanized anti-VEGF monoclonal antibody, has single-agent activity in previously treated, recurrent disease. Most patients in whom recurrent cervical cancer develops have previously received cisplatin with radiation therapy, which reduces the effectiveness of cisplatin at the time of recurrence. We evaluated the effectiveness of bevacizumab and nonplatinum combination chemotherapy in patients with recurrent, persistent, or metastatic cervical cancer. Methods Using a 2-by-2 factorial design, we randomly  assigned 452 patients to chemotherapy with or without bevacizumab at a dose of 15 mg per kilogram of body weight. Chemotherapy consisted of cisplatin at a dose of 50 mg per square meter of body-surface area, plus paclitaxel at a dose of 135 or 175 mg per square meter or topotecan at a dose of 0.75 mg per square meter on days 1 to 3, plus paclitaxel at a dose of 175 mg per square meter on day 1. Cycles were repeated every 21 days until disease progression, the development of unacceptable toxic effects, or a complete response was documented. The primary end point was overall survival; a reduction of 30% in the hazard ratio for death was considered clinically important. Results Groups were well balanced with respect to age, histologic findings, performance status, previous use or nonuse of a radiosensitizing platinum agent, and disease status. Topotecan?paclitaxel was not superior to cisplatin?paclitaxel (hazard ratio for death, 1.20). With the data for the two chemotherapy regimens combined, the addition of bevacizumab to chemotherapy was associated with increased overall survival (17.0 months vs. 13.3 months; hazard ratio for death, 0.71; 98% confidence interval, 0.54 to 0.95; P = 0.004 in a one-sided test) and higher response rates (48% vs. 36%, P = 0.008). Bevacizumab, as compared with chemotherapy alone, was associated with an increased incidence of hypertension of grade 2 or higher (25% vs. 2%), thromboembolic events of grade 3 or higher (8% vs. 1%), and gastrointestinal fistulas of grade 3 or higher (3% vs. 0%). Conclusions The addition of bevacizumab to combination chemotherapy in patients with recurrent, persistent, or metastatic cervical cancer was associated with an improvement of 3.7 months in median overall survival. (Funded by the [employer]; [place].gov number, WLN98921194.)  We discussed some of the risks, benefits, side-effects of  cisplatin, Taxol &  Avastin. Treatment is intravenous, every 3 weeks x 6 cycles  Some of the short term side-effects included, though not limited to, including weight loss, life threatening infections, bleeding, blood clots, risk of allergic reactions, need for transfusions of blood products, nausea, vomiting, change in bowel habits, loss of hair, admission to hospital for various reasons, and risks of death.   Long term side-effects are also discussed including risks of infertility, permanent damage to nerve function, hearing loss, chronic fatigue, kidney damage with possibility needing hemodialysis, and rare secondary malignancy including bone marrow disorders.  The patient is aware that the response rates discussed earlier is not guaranteed.  After a long discussion, patient made an informed decision to proceed with the prescribed plan of care.   Patient education material was dispensed.

## 2017-08-19 NOTE — Progress Notes (Signed)
Gynecologic Oncology Multi-Disciplinary Disposition Conference Note  Date of the Conference: August 19, 2017  Patient Name: Stephanie Fuentes  Referring Provider: Dr. Nyoka Cowden Primary GYN Oncologist: Dr. Fermin Schwab  Stage/Disposition:  Poorly differentiated carcinoma of GYN origin.  Disposition is to chemotherapy with cisplatin, taxol, and bevacizumab (beginning with second cycle).   This Multidisciplinary conference took place involving physicians from [place], [place], Radiation Oncology, Pathology, Radiology along with the Gynecologic Oncology Nurse Practitioner and RN.  Comprehensive assessment of the patient's malignancy, staging, need for surgery, chemotherapy, radiation therapy, and need for further testing were reviewed. Supportive measures, both inpatient and following discharge were also discussed. The recommended plan of care is documented. Greater than 35 minutes were spent correlating and coordinating this patient's care.

## 2017-08-19 NOTE — Progress Notes (Signed)
ALERT: A disease instance has been permanently removed from this patient's pathway record and replaced with a new disease instance. Information on the new disease instance will be transmitted in a separate message.  Disease Being Removed: Uterine  Reason for Removal: Previous diagnosis has morphed into a different diagnosis

## 2017-08-19 NOTE — Assessment & Plan Note (Signed)
She has severe bleeding She will received 1 unit of blood transfusion today She would receive further blood transfusion and of the week Once her blood count is stable, we will give her intravenous iron infusion We discussed some of the risks, benefits, and alternatives of blood transfusions. The patient is symptomatic from anemia and the hemoglobin level is critically low.  Some of the side-effects to be expected including risks of transfusion reactions, chills, infection, syndrome of volume overload and risk of hospitalization from various reasons and the patient is willing to proceed and went ahead to sign consent today.

## 2017-08-19 NOTE — Assessment & Plan Note (Signed)
She understood goals of care is strictly palliative in nature.

## 2017-08-19 NOTE — Progress Notes (Signed)
START OFF PATHWAY REGIMEN Stephanie Fuentes Dx]   [id number]:Bevacizumab 15 mg/kg + Cisplatin 50 mg/m2 + Paclitaxel 175 mg/m2 q21 Days:   A cycle is every 21 days:     Paclitaxel      Cisplatin      Bevacizumab   **Always confirm dose/schedule in your pharmacy ordering system**    Patient Characteristics: Intent of Therapy: Non-Curative / Palliative Intent, Discussed with Patient

## 2017-08-19 NOTE — Patient Instructions (Addendum)
Blood Transfusion, Care After This sheet gives you information about how to care for yourself after your procedure. Your doctor may also give you more specific instructions. If you have problems or questions, contact your doctor. Follow these instructions at home:  Take over-the-counter and prescription medicines only as told by your doctor.  Go back to your normal activities as told by your doctor.  Follow instructions from your doctor about how to take care of the area where an IV tube was put into your vein (insertion site). Make sure you: ? Wash your hands with soap and water before you change your bandage (dressing). If there is no soap and water, use hand sanitizer. ? Change your bandage as told by your doctor.  Check your IV insertion site every day for signs of infection. Check for: ? More redness, swelling, or pain. ? More fluid or blood. ? Warmth. ? Pus or a bad smell. Contact a doctor if:  You have more redness, swelling, or pain around the IV insertion site..  You have more fluid or blood coming from the IV insertion site.  Your IV insertion site feels warm to the touch.  You have pus or a bad smell coming from the IV insertion site.  Your pee (urine) turns pink, red, or brown.  You feel weak after doing your normal activities. Get help right away if:  You have signs of a serious allergic or body defense (immune) system reaction, including: ? Itchiness. ? Hives. ? Trouble breathing. ? Anxiety. ? Pain in your chest or lower back. ? Fever, flushing, and chills. ? Fast pulse. ? Rash. ? Watery poop (diarrhea). ? Throwing up (vomiting). ? Dark pee. ? Serious headache. ? Dizziness. ? Stiff neck. ? Yellow color in your face or the white parts of your eyes (jaundice). Summary  After a blood transfusion, return to your normal activities as told by your doctor.  Every day, check for signs of infection where the IV tube was put into your vein.  Some signs of  infection are warm skin, more redness and pain, more fluid or blood, and pus or a bad smell where the needle went in.  Contact your doctor if you feel weak or have any unusual symptoms. This information is not intended to replace advice given to you by your health care provider. Make sure you discuss any questions you have with your health care provider. Document Released: 07/30/2014 Document Revised: 03/02/2016 Document Reviewed: 03/02/2016 Elsevier Interactive Patient Education  2017 Elsevier Inc.   Implanted Port Home Guide An implanted port is a type of central line that is placed under the skin. Central lines are used to provide IV access when treatment or nutrition needs to be given through a person's veins. Implanted ports are used for long-term IV access. An implanted port may be placed because:  You need IV medicine that would be irritating to the small veins in your hands or arms.  You need long-term IV medicines, such as antibiotics.  You need IV nutrition for a long period.  You need frequent blood draws for lab tests.  You need dialysis.  Implanted ports are usually placed in the chest area, but they can also be placed in the upper arm, the abdomen, or the leg. An implanted port has two main parts:  Reservoir. The reservoir is round and will appear as a small, raised area under your skin. The reservoir is the part where a needle is inserted to give medicines or draw   blood.  Catheter. The catheter is a thin, flexible tube that extends from the reservoir. The catheter is placed into a large vein. Medicine that is inserted into the reservoir goes into the catheter and then into the vein.  How will I care for my incision site? Do not get the incision site wet. Bathe or shower as directed by your health care provider. How is my port accessed? Special steps must be taken to access the port:  Before the port is accessed, a numbing cream can be placed on the skin. This helps  numb the skin over the port site.  Your health care provider uses a sterile technique to access the port. ? Your health care provider must put on a mask and sterile gloves. ? The skin over your port is cleaned carefully with an antiseptic and allowed to dry. ? The port is gently pinched between sterile gloves, and a needle is inserted into the port.  Only "non-coring" port needles should be used to access the port. Once the port is accessed, a blood return should be checked. This helps ensure that the port is in the vein and is not clogged.  If your port needs to remain accessed for a constant infusion, a clear (transparent) bandage will be placed over the needle site. The bandage and needle will need to be changed every week, or as directed by your health care provider.  Keep the bandage covering the needle clean and dry. Do not get it wet. Follow your health care provider's instructions on how to take a shower or bath while the port is accessed.  If your port does not need to stay accessed, no bandage is needed over the port.  What is flushing? Flushing helps keep the port from getting clogged. Follow your health care provider's instructions on how and when to flush the port. Ports are usually flushed with saline solution or a medicine called heparin. The need for flushing will depend on how the port is used.  If the port is used for intermittent medicines or blood draws, the port will need to be flushed: ? After medicines have been given. ? After blood has been drawn. ? As part of routine maintenance.  If a constant infusion is running, the port may not need to be flushed.  How long will my port stay implanted? The port can stay in for as long as your health care provider thinks it is needed. When it is time for the port to come out, surgery will be done to remove it. The procedure is similar to the one performed when the port was put in. When should I seek immediate medical care? When  you have an implanted port, you should seek immediate medical care if:  You notice a bad smell coming from the incision site.  You have swelling, redness, or drainage at the incision site.  You have more swelling or pain at the port site or the surrounding area.  You have a fever that is not controlled with medicine.  This information is not intended to replace advice given to you by your health care provider. Make sure you discuss any questions you have with your health care provider. Document Released: 07/09/2005 Document Revised: 12/15/2015 Document Reviewed: 03/16/2013 Elsevier Interactive Patient Education  2017 Elsevier Inc.  

## 2017-08-20 ENCOUNTER — Other Ambulatory Visit: Payer: Self-pay | Admitting: General Surgery

## 2017-08-20 ENCOUNTER — Telehealth: Payer: Self-pay | Admitting: Hematology and Oncology

## 2017-08-20 LAB — TYPE AND SCREEN
ABO/RH(D): O POS
ANTIBODY SCREEN: NEGATIVE
Unit division: 0

## 2017-08-20 LAB — BPAM RBC
BLOOD PRODUCT EXPIRATION DATE: 201902262359
ISSUE DATE / TIME: 201901280908
Unit Type and Rh: 5100

## 2017-08-20 NOTE — Telephone Encounter (Signed)
08/20/17 Called @ 9:53 am and left vm with patient confirming FMLA paperwork ready for pick-up.  Patient aware is at front desk reception area.

## 2017-08-21 ENCOUNTER — Other Ambulatory Visit: Payer: Self-pay | Admitting: Hematology and Oncology

## 2017-08-21 ENCOUNTER — Encounter (HOSPITAL_COMMUNITY): Payer: Self-pay | Admitting: *Deleted

## 2017-08-21 ENCOUNTER — Ambulatory Visit (HOSPITAL_COMMUNITY)
Admission: RE | Admit: 2017-08-21 | Discharge: 2017-08-21 | Disposition: A | Discharge status: Home / Self Care | Payer: BC Managed Care – PPO | Source: Ambulatory Visit | Attending: Hematology and Oncology | Admitting: Hematology and Oncology

## 2017-08-21 DIAGNOSIS — C787 Secondary malignant neoplasm of liver and intrahepatic bile duct: Secondary | ICD-10-CM

## 2017-08-21 DIAGNOSIS — C541 Malignant neoplasm of endometrium: Secondary | ICD-10-CM | POA: Insufficient documentation

## 2017-08-21 DIAGNOSIS — Z800 Family history of malignant neoplasm of digestive organs: Secondary | ICD-10-CM | POA: Diagnosis not present

## 2017-08-21 DIAGNOSIS — Z8051 Family history of malignant neoplasm of kidney: Secondary | ICD-10-CM | POA: Diagnosis not present

## 2017-08-21 HISTORY — PX: IR US GUIDE VASC ACCESS RIGHT: IMG2390

## 2017-08-21 HISTORY — PX: IR FLUORO GUIDE PORT INSERTION RIGHT: IMG5741

## 2017-08-21 LAB — CBC WITH DIFFERENTIAL/PLATELET
BASOS ABS: 0 10*3/uL (ref 0.0–0.1)
Basophils Relative: 0 %
EOS ABS: 0 10*3/uL (ref 0.0–0.7)
Eosinophils Relative: 0 %
HCT: 27.8 % — ABNORMAL LOW (ref 36.0–46.0)
HEMOGLOBIN: 8.1 g/dL — AB (ref 12.0–15.0)
LYMPHS ABS: 0.7 10*3/uL (ref 0.7–4.0)
Lymphocytes Relative: 6 %
MCH: 21.2 pg — AB (ref 26.0–34.0)
MCHC: 29.1 g/dL — AB (ref 30.0–36.0)
MCV: 72.8 fL — ABNORMAL LOW (ref 78.0–100.0)
Monocytes Absolute: 1 10*3/uL (ref 0.1–1.0)
Monocytes Relative: 9 %
NEUTROS ABS: 9.4 10*3/uL — AB (ref 1.7–7.7)
Neutrophils Relative %: 85 %
Platelets: 267 10*3/uL (ref 150–400)
RBC: 3.82 MIL/uL — ABNORMAL LOW (ref 3.87–5.11)
RDW: 24.6 % — ABNORMAL HIGH (ref 11.5–15.5)
WBC: 11.1 10*3/uL — ABNORMAL HIGH (ref 4.0–10.5)

## 2017-08-21 LAB — BASIC METABOLIC PANEL
Anion gap: 12 (ref 5–15)
BUN: 8 mg/dL (ref 6–20)
CALCIUM: 8.5 mg/dL — AB (ref 8.9–10.3)
CO2: 24 mmol/L (ref 22–32)
CREATININE: 0.48 mg/dL (ref 0.44–1.00)
Chloride: 99 mmol/L — ABNORMAL LOW (ref 101–111)
GFR calc Af Amer: >60 mL/min (ref >60)
GLUCOSE: 88 mg/dL (ref 65–99)
Potassium: 4 mmol/L (ref 3.5–5.1)
Sodium: 135 mmol/L (ref 135–145)

## 2017-08-21 MED ORDER — CEFAZOLIN SODIUM-DEXTROSE 2-4 GM/100ML-% IV SOLN
2.0000 g | INTRAVENOUS | Status: AC
  Administered 2017-08-21: 2 g via INTRAVENOUS

## 2017-08-21 MED ORDER — LIDOCAINE HCL 1 % IJ SOLN
INTRAMUSCULAR | Status: AC
  Filled 2017-08-21: qty 20

## 2017-08-21 MED ORDER — LIDOCAINE-EPINEPHRINE (PF) 1 %-1:200000 IJ SOLN
INTRAMUSCULAR | Status: AC | PRN
  Administered 2017-08-21: 20 mL

## 2017-08-21 MED ORDER — HEPARIN SOD (PORK) LOCK FLUSH 100 UNIT/ML IV SOLN
INTRAVENOUS | Status: AC | PRN
  Administered 2017-08-21: 500 [IU] via INTRAVENOUS

## 2017-08-21 MED ORDER — MIDAZOLAM HCL 2 MG/2ML IJ SOLN
INTRAMUSCULAR | Status: AC
  Filled 2017-08-21: qty 2

## 2017-08-21 MED ORDER — FENTANYL CITRATE (PF) 100 MCG/2ML IJ SOLN
INTRAMUSCULAR | Status: AC
  Filled 2017-08-21: qty 2

## 2017-08-21 MED ORDER — SODIUM CHLORIDE 0.9 % IV SOLN
INTRAVENOUS | Status: DC
  Administered 2017-08-21: 10:00:00 via INTRAVENOUS

## 2017-08-21 MED ORDER — FENTANYL CITRATE (PF) 100 MCG/2ML IJ SOLN
INTRAMUSCULAR | Status: AC | PRN
  Administered 2017-08-21 (×2): 50 ug via INTRAVENOUS

## 2017-08-21 MED ORDER — LIDOCAINE HCL 1 % IJ SOLN
INTRAMUSCULAR | Status: AC | PRN
  Administered 2017-08-21: 10 mL

## 2017-08-21 MED ORDER — CEFAZOLIN SODIUM-DEXTROSE 2-4 GM/100ML-% IV SOLN
INTRAVENOUS | Status: AC
  Administered 2017-08-21: 2 g via INTRAVENOUS
  Filled 2017-08-21: qty 100

## 2017-08-21 MED ORDER — HEPARIN SOD (PORK) LOCK FLUSH 100 UNIT/ML IV SOLN
INTRAVENOUS | Status: AC
  Filled 2017-08-21: qty 5

## 2017-08-21 MED ORDER — LIDOCAINE-EPINEPHRINE (PF) 2 %-1:200000 IJ SOLN
INTRAMUSCULAR | Status: AC
  Filled 2017-08-21: qty 20

## 2017-08-21 MED ORDER — MIDAZOLAM HCL 2 MG/2ML IJ SOLN
INTRAMUSCULAR | Status: AC | PRN
  Administered 2017-08-21 (×2): 1 mg via INTRAVENOUS

## 2017-08-21 NOTE — Procedures (Signed)
Gyn carcinoma  S/p RT IJ POWER PORT  TIP SVCRA NO COMP STABLE EBL 0 READY FOR USE

## 2017-08-21 NOTE — Discharge Instructions (Signed)
Moderate Conscious Sedation, Adult, Care After °These instructions provide you with information about caring for yourself after your procedure. Your health care provider may also give you more specific instructions. Your treatment has been planned according to current medical practices, but problems sometimes occur. Call your health care provider if you have any problems or questions after your procedure. °What can I expect after the procedure? °After your procedure, it is common: °· To feel sleepy for several hours. °· To feel clumsy and have poor balance for several hours. °· To have poor judgment for several hours. °· To vomit if you eat too soon. ° °Follow these instructions at home: °For at least 24 hours after the procedure: ° °· Do not: °? Participate in activities where you could fall or become injured. °? Drive. °? Use heavy machinery. °? Drink alcohol. °? Take sleeping pills or medicines that cause drowsiness. °? Make important decisions or sign legal documents. °? Take care of children on your own. °· Rest. °Eating and drinking °· Follow the diet recommended by your health care provider. °· If you vomit: °? Drink water, juice, or soup when you can drink without vomiting. °? Make sure you have little or no nausea before eating solid foods. °General instructions °· Have a responsible adult stay with you until you are awake and alert. °· Take over-the-counter and prescription medicines only as told by your health care provider. °· If you smoke, do not smoke without supervision. °· Keep all follow-up visits as told by your health care provider. This is important. °Contact a health care provider if: °· You keep feeling nauseous or you keep vomiting. °· You feel light-headed. °· You develop a rash. °· You have a fever. °Get help right away if: °· You have trouble breathing. °This information is not intended to replace advice given to you by your health care provider. Make sure you discuss any questions you have  with your health care provider. °Document Released: 04/29/2013 Document Revised: 12/12/2015 Document Reviewed: 10/29/2015 °Elsevier Interactive Patient Education © 2018 Elsevier Inc. ° ° °Implanted Port Insertion, Care After °This sheet gives you information about how to care for yourself after your procedure. Your health care provider may also give you more specific instructions. If you have problems or questions, contact your health care provider. °What can I expect after the procedure? °After your procedure, it is common to have: °· Discomfort at the port insertion site. °· Bruising on the skin over the port. This should improve over 3-4 days. ° °Follow these instructions at home: °Port care °· After your port is placed, you will get a manufacturer's information card. The card has information about your port. Keep this card with you at all times. °· Take care of the port as told by your health care provider. Ask your health care provider if you or a family member can get training for taking care of the port at home. A home health care nurse may also take care of the port. °· Make sure to remember what type of port you have. °Incision care °· Follow instructions from your health care provider about how to take care of your port insertion site. Make sure you: °? Wash your hands with soap and water before you change your bandage (dressing). If soap and water are not available, use hand sanitizer. °? Change your dressing as told by your health care provider.  You may remove your dressing tomorrow. °? Leave skin glue in place. These skin closures   may need to stay in place for 2 weeks or longer. If adhesive strip edges start to loosen and curl up, you may trim the loose edges. Do not remove adhesive strips completely unless your health care provider tells you to do that.  DO NOT use EMLA cream for 2 weeks after port placement as this cream will remove surgical glue on your incision. °· Check your port insertion site  every day for signs of infection. Check for: °? More redness, swelling, or pain. °? More fluid or blood. °? Warmth. °? Pus or a bad smell. °General instructions °· Do not take baths, swim, or use a hot tub until your health care provider approves.  You may shower tomorrow. °· Do not lift anything that is heavier than 10 lb (4.5 kg) for a week, or as told by your health care provider. °· Ask your health care provider when it is okay to: °? Return to work or school. °? Resume usual physical activities or sports. °· Do not drive for 24 hours if you were given a medicine to help you relax (sedative). °· Take over-the-counter and prescription medicines only as told by your health care provider. °· Wear a medical alert bracelet in case of an emergency. This will tell any health care providers that you have a port. °· Keep all follow-up visits as told by your health care provider. This is important. °Contact a health care provider if: °· You have a fever or chills. °· You have more redness, swelling, or pain around your port insertion site. °· You have more fluid or blood coming from your port insertion site. °· Your port insertion site feels warm to the touch. °· You have pus or a bad smell coming from the port insertion site. °Get help right away if: °· You have chest pain or shortness of breath. °· You have bleeding from your port that you cannot control. °Summary °· Take care of the port as told by your health care provider. °· Change your dressing as told by your health care provider. °· Keep all follow-up visits as told by your health care provider. °This information is not intended to replace advice given to you by your health care provider. Make sure you discuss any questions you have with your health care provider. °Document Released: 04/29/2013 Document Revised: 05/30/2016 Document Reviewed: 05/30/2016 °Elsevier Interactive Patient Education © 2017 [employer] Inc. ° °

## 2017-08-21 NOTE — H&P (Signed)
Chief Complaint: Patient was seen in consultation today for port placement at the request of [place]  Referring Physician(s): Heath Lark  Supervising Physician: Daryll Brod  Patient Status: [facility] - Out-pt  History of Present Illness: Stephanie Fuentes is a 47 y.o. 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 She is referred for port placement. PMHx, meds, labs, allergies reviewed. Has been NPO this am. Feels well, no fevers, chills.  Past Medical History:  Diagnosis Date  . Anemia   . Blood transfusion without reported diagnosis   . Fibroids   . uterine ca dx'd 07/30/17    Past Surgical History:  Procedure Laterality Date  . CESAREAN SECTION  2016   Reason: IUFD, Fibroids    Allergies: Benadryl [diphenhydramine]  Medications: Prior to Admission medications   Medication Sig Start Date End Date Taking? Authorizing Provider  acetaminophen (TYLENOL) 325 MG tablet Take 650 mg by mouth every 6 (six) hours as needed.   Yes [provider]  prochlorperazine (COMPAZINE) 10 MG tablet Take 1 tablet (10 mg total) by mouth every 6 (six) hours as needed for nausea or vomiting. 08/02/17  Yes Cross, Melissa D, NP  lidocaine-prilocaine (EMLA) cream Apply to affected area once 08/19/17   Heath Lark, MD  ondansetron (ZOFRAN) 8 MG tablet Take 1 tablet (8 mg total) by mouth 2 (two) times daily as needed. Start on the third day after chemotherapy. 08/19/17   Heath Lark, MD     Family History  Problem Relation Age of Onset  . Kidney cancer Mother 35       kidney ca  . Diabetes Mother   . Colon cancer Sister 65       colon ca    Social History   Socioeconomic History  . Marital status: Single    Spouse name: None  . Number of children: None  . Years of education: None  . Highest education level: None  Social Needs  . Financial resource strain: None  . Food insecurity - worry: None  . Food insecurity - inability: None  . Transportation needs - medical:  None  . Transportation needs - non-medical: None  Occupational History  . None  Tobacco Use  . Smoking status: Never Smoker  . Smokeless tobacco: Never Used  Substance and Sexual Activity  . Alcohol use: No    Frequency: Never  . Drug use: No  . Sexual activity: None  Other Topics Concern  . None  Social History Narrative  . None     Review of Systems: A 12 point ROS discussed and pertinent positives are indicated in the HPI above.  All other systems are negative.  Review of Systems  Vital Signs: BP 107/66 (BP Location: Right Arm)   Pulse (!) 105   Temp 98.7 F (37.1 C) (Oral)   Resp 16   SpO2 100%   Physical Exam  Constitutional: She is oriented to person, place, and time. She appears well-developed. No distress.  HENT:  Head: Normocephalic.  Mouth/Throat: Oropharynx is clear and moist.  Neck: Normal range of motion. No tracheal deviation present.  Cardiovascular: Normal rate, regular rhythm and normal heart sounds.  Pulmonary/Chest: Effort normal and breath sounds normal. No respiratory distress.  Abdominal: Soft. She exhibits no distension.  Neurological: She is alert and oriented to person, place, and time.  Psychiatric: She has a normal mood and affect. Judgment normal.     Imaging: Ct Chest W Contrast  Result Date: 08/06/2017 CLINICAL DATA:  Newly diagnosed high-grade  uterine carcinoma. Staging. EXAM: CT CHEST, ABDOMEN, AND PELVIS WITH CONTRAST TECHNIQUE: Multidetector CT imaging of the chest, abdomen and pelvis was performed following the standard protocol during bolus administration of intravenous contrast. CONTRAST:  171mL ISOVUE-300 IOPAMIDOL (ISOVUE-300) INJECTION 61% COMPARISON:  None. FINDINGS: CT CHEST FINDINGS Cardiovascular: No acute findings. Mediastinum/Lymph Nodes: No masses or pathologically enlarged lymph nodes identified. Lungs/Pleura: No pulmonary infiltrate or mass identified. No effusion present. Musculoskeletal:  No suspicious bone lesions  identified. CT ABDOMEN AND PELVIS FINDINGS Hepatobiliary: Multiple large liver masses are seen in both right and left lobes. Largest mass in left hepatic lobe measures 12.2 x 9.6 cm on image 78/2. Largest mass in right lobe measures 9.3 x 7.1 cm on image 60/2. Majority of these larger masses are very similar in appearance, with central areas of necrosis or scarring. Gallbladder is unremarkable. No evidence of biliary ductal dilatation. Pancreas:  No mass or inflammatory changes. Spleen:  Within normal limits in size and appearance. Adrenals/Urinary tract:  No masses or hydronephrosis. Stomach/Bowel: No evidence of obstruction, inflammatory process, or abnormal fluid collections. Vascular/Lymphatic: Bilateral external iliac lymphadenopathy is seen, measuring 1.6 cm on the right (image 104/2), and 1.4 cm on the left (image 102/2). Mild lymphadenopathy in the right common iliac chain measures 1.2 cm on image 90/2. No abdominal aortic aneurysm. Reproductive: Multiple uterine fibroids are seen, several of which show cystic degeneration and peripheral calcification. Largest in the left lower uterine segment measures 9.7 cm. A heterogeneously enhancing mass is seen area of the cervix and upper vagina which measures 6.6 x 5.1 cm on image 111/2. This is highly suspicious for cervical carcinoma. Ovoid cystic lesions are seen in both adnexal regions, measuring 4.7 cm on left and 5.5 cm on the right. Although higher than simple fluid attenuation, these lesions show no evidence of enhancing septations or soft tissue nodularity. Other:  None. Musculoskeletal:  No suspicious bone lesions identified. IMPRESSION: 6.6 cm uterine mass in area of cervix and upper vagina, highly suspicious for cervical carcinoma. Mild bilateral iliac lymphadenopathy, consistent with metastatic disease. No abdominal lymphadenopathy identified. Multiple large liver masses, with areas of central necrosis or scarring, highly suspicious for liver metastases.  Although unlikely, it is conceivable that some of these lesions could represent other etiology such as focal nodular hyperplasia or adenomas. Consider further evaluation with PET-CT, abdomen MRI without and with contrast, or percutaneous needle biopsy. Multiple large uterine fibroids, many showing cystic degeneration. Bilateral cystic adnexal lesions with probably benign features. Differential diagnosis includes ovarian cysts, endometriomas, and hydrosalpinges. These could also be further assessed on PET-CT or MRI. No evidence of thoracic metastatic disease. Electronically Signed   By: Earle Gell M.D.   On: 08/06/2017 10:30   Ct Abdomen Pelvis W Contrast  Result Date: 08/06/2017 CLINICAL DATA:  Newly diagnosed high-grade uterine carcinoma. Staging. EXAM: CT CHEST, ABDOMEN, AND PELVIS WITH CONTRAST TECHNIQUE: Multidetector CT imaging of the chest, abdomen and pelvis was performed following the standard protocol during bolus administration of intravenous contrast. CONTRAST:  138mL ISOVUE-300 IOPAMIDOL (ISOVUE-300) INJECTION 61% COMPARISON:  None. FINDINGS: CT CHEST FINDINGS Cardiovascular: No acute findings. Mediastinum/Lymph Nodes: No masses or pathologically enlarged lymph nodes identified. Lungs/Pleura: No pulmonary infiltrate or mass identified. No effusion present. Musculoskeletal:  No suspicious bone lesions identified. CT ABDOMEN AND PELVIS FINDINGS Hepatobiliary: Multiple large liver masses are seen in both right and left lobes. Largest mass in left hepatic lobe measures 12.2 x 9.6 cm on image 78/2. Largest mass in right lobe measures 9.3 x  7.1 cm on image 60/2. Majority of these larger masses are very similar in appearance, with central areas of necrosis or scarring. Gallbladder is unremarkable. No evidence of biliary ductal dilatation. Pancreas:  No mass or inflammatory changes. Spleen:  Within normal limits in size and appearance. Adrenals/Urinary tract:  No masses or hydronephrosis. Stomach/Bowel: No  evidence of obstruction, inflammatory process, or abnormal fluid collections. Vascular/Lymphatic: Bilateral external iliac lymphadenopathy is seen, measuring 1.6 cm on the right (image 104/2), and 1.4 cm on the left (image 102/2). Mild lymphadenopathy in the right common iliac chain measures 1.2 cm on image 90/2. No abdominal aortic aneurysm. Reproductive: Multiple uterine fibroids are seen, several of which show cystic degeneration and peripheral calcification. Largest in the left lower uterine segment measures 9.7 cm. A heterogeneously enhancing mass is seen area of the cervix and upper vagina which measures 6.6 x 5.1 cm on image 111/2. This is highly suspicious for cervical carcinoma. Ovoid cystic lesions are seen in both adnexal regions, measuring 4.7 cm on left and 5.5 cm on the right. Although higher than simple fluid attenuation, these lesions show no evidence of enhancing septations or soft tissue nodularity. Other:  None. Musculoskeletal:  No suspicious bone lesions identified. IMPRESSION: 6.6 cm uterine mass in area of cervix and upper vagina, highly suspicious for cervical carcinoma. Mild bilateral iliac lymphadenopathy, consistent with metastatic disease. No abdominal lymphadenopathy identified. Multiple large liver masses, with areas of central necrosis or scarring, highly suspicious for liver metastases. Although unlikely, it is conceivable that some of these lesions could represent other etiology such as focal nodular hyperplasia or adenomas. Consider further evaluation with PET-CT, abdomen MRI without and with contrast, or percutaneous needle biopsy. Multiple large uterine fibroids, many showing cystic degeneration. Bilateral cystic adnexal lesions with probably benign features. Differential diagnosis includes ovarian cysts, endometriomas, and hydrosalpinges. These could also be further assessed on PET-CT or MRI. No evidence of thoracic metastatic disease. Electronically Signed   By: Earle Gell M.D.    On: 08/06/2017 10:30   US Biopsy (liver)  Result Date: 08/14/2017 INDICATION: 47 year old with uterine cancer and large liver masses. EXAM: ULTRASOUND-GUIDED LIVER LESION BIOPSY MEDICATIONS: None. ANESTHESIA/SEDATION: Moderate (conscious) sedation was employed during this procedure. A total of Versed 2.0 mg and Fentanyl 100 mcg was administered intravenously. Moderate Sedation Time: 20 minutes. The patient's level of consciousness and vital signs were monitored continuously by radiology nursing throughout the procedure under my direct supervision. FLUOROSCOPY TIME:  None COMPLICATIONS: None immediate. PROCEDURE: Informed written consent was obtained from the patient after a thorough discussion of the procedural risks, benefits and alternatives. All questions were addressed. A timeout was performed prior to the initiation of the procedure. Liver was evaluated with ultrasound. Two large lesions were identified in left hepatic lobe. The more cephalad lesion was targeted for biopsy. Left side of the abdomen was prepped with chlorhexidine and a sterile field was created. Skin and soft tissues were anesthetized with 1% lidocaine. 28 gauge coaxial needle was directed into the lesion with ultrasound guidance while traversing normal liver tissue. A total of 5 core biopsies were obtained with an 18 gauge device. Specimens placed in formalin. Gel-Foam slurry was injected through the 17 gauge needle as it was removed. Bandage placed over the puncture site. FINDINGS: Numerous large hepatic lesions. Core biopsies obtained from the more cephalad left hepatic lesion. No evidence for bleeding or hematoma formation. IMPRESSION: Ultrasound-guided core biopsies of a left hepatic lesion. Electronically Signed   By: Scherrie Gerlach.D.  On: 08/14/2017 13:57    Labs:  CBC: Recent Labs    08/09/17 1320 08/14/17 1116 08/19/17 0728  WBC 10.1 8.9 9.4  HGB 6.2* 7.3*  --   HCT 22.6* 24.9* 22.4*  PLT 383 299 285     COAGS: Recent Labs    08/09/17 1320  INR 1.11  APTT 28    BMP: Recent Labs    08/09/17 1320 08/14/17 1116 08/19/17 0728  NA 134* 133* 133*  K 4.2 4.1 3.2*  CL 99 99* 98  CO2 21* 22 23  GLUCOSE 89 85 102  BUN 8 8 5*  CALCIUM 8.8 8.4* 8.7  CREATININE 0.71 0.54  --   GFRNONAA >60 >60 >60  GFRAA >60 >60 >60    LIVER FUNCTION TESTS: Recent Labs    08/09/17 1320 08/14/17 1116 08/19/17 0728  BILITOT 0.7 0.8 0.6  AST 47* 70* 60*  ALT 14 19 15   ALKPHOS 198* 179* 222*  PROT 7.3 6.8 6.6  ALBUMIN 3.5 3.2* 3.1*    TUMOR MARKERS: No results for input(s): AFPTM, CEA, CA199, CHROMGRNA in the last 8760 hours.  Assessment and Plan: GYN carcinoma For port placement Labs pending Risks and benefits discussed with the patient including, but not limited to bleeding, infection, pneumothorax, or fibrin sheath development and need for additional procedures. All of the patient's questions were answered, patient is agreeable to proceed. Consent signed and in chart.    Thank you for this interesting consult.  I greatly enjoyed meeting Stephanie Fuentes and look forward to participating in their care.  A copy of this report was sent to the requesting provider on this date.  Electronically Signed: Ascencion Dike, PA-C 08/21/2017, 10:09 AM   I spent a total of 20 Minutes  in face to face in clinical consultation, greater than 50% of which was counseling/coordinating care for port

## 2017-08-23 ENCOUNTER — Inpatient Hospital Stay: Payer: BC Managed Care – PPO

## 2017-08-23 ENCOUNTER — Ambulatory Visit: Payer: BC Managed Care – PPO

## 2017-08-23 ENCOUNTER — Ambulatory Visit (HOSPITAL_COMMUNITY)
Admission: RE | Admit: 2017-08-23 | Discharge: 2017-08-23 | Disposition: A | Discharge status: Home / Self Care | Payer: BC Managed Care – PPO | Source: Ambulatory Visit | Attending: Hematology and Oncology | Admitting: Hematology and Oncology

## 2017-08-23 ENCOUNTER — Inpatient Hospital Stay: Payer: BC Managed Care – PPO | Attending: Hematology and Oncology

## 2017-08-23 ENCOUNTER — Inpatient Hospital Stay (HOSPITAL_BASED_OUTPATIENT_CLINIC_OR_DEPARTMENT_OTHER): Payer: BC Managed Care – PPO | Admitting: Hematology and Oncology

## 2017-08-23 ENCOUNTER — Encounter (HOSPITAL_COMMUNITY): Payer: Self-pay

## 2017-08-23 ENCOUNTER — Other Ambulatory Visit: Payer: Self-pay | Admitting: Hematology and Oncology

## 2017-08-23 DIAGNOSIS — D500 Iron deficiency anemia secondary to blood loss (chronic): Secondary | ICD-10-CM

## 2017-08-23 DIAGNOSIS — C530 Malignant neoplasm of endocervix: Secondary | ICD-10-CM | POA: Insufficient documentation

## 2017-08-23 DIAGNOSIS — C541 Malignant neoplasm of endometrium: Secondary | ICD-10-CM

## 2017-08-23 DIAGNOSIS — R197 Diarrhea, unspecified: Secondary | ICD-10-CM | POA: Diagnosis not present

## 2017-08-23 DIAGNOSIS — C787 Secondary malignant neoplasm of liver and intrahepatic bile duct: Secondary | ICD-10-CM

## 2017-08-23 DIAGNOSIS — Z7189 Other specified counseling: Secondary | ICD-10-CM | POA: Diagnosis not present

## 2017-08-23 DIAGNOSIS — E440 Moderate protein-calorie malnutrition: Secondary | ICD-10-CM | POA: Diagnosis not present

## 2017-08-23 DIAGNOSIS — Z79899 Other long term (current) drug therapy: Secondary | ICD-10-CM | POA: Diagnosis not present

## 2017-08-23 DIAGNOSIS — R634 Abnormal weight loss: Secondary | ICD-10-CM | POA: Insufficient documentation

## 2017-08-23 LAB — CMP (CANCER CENTER ONLY)
ALBUMIN: 2.8 g/dL — AB (ref 3.5–5.0)
ALT: 13 U/L (ref 0–55)
AST: 53 U/L — AB (ref 5–34)
Alkaline Phosphatase: 235 U/L — ABNORMAL HIGH (ref 40–150)
Anion gap: 14 — ABNORMAL HIGH (ref 3–11)
BUN: 6 mg/dL — AB (ref 7–26)
CHLORIDE: 101 mmol/L (ref 98–109)
CO2: 23 mmol/L (ref 22–29)
Calcium: 8.3 mg/dL — ABNORMAL LOW (ref 8.4–10.4)
Creatinine: 0.58 mg/dL — ABNORMAL LOW (ref 0.60–1.10)
GFR, Est AFR Am: >60 mL/min (ref >60)
Glucose, Bld: 101 mg/dL (ref 70–140)
POTASSIUM: 3.4 mmol/L — AB (ref 3.5–5.1)
SODIUM: 138 mmol/L (ref 136–145)
Total Bilirubin: 0.7 mg/dL (ref 0.2–1.2)
Total Protein: 6.2 g/dL — ABNORMAL LOW (ref 6.4–8.3)

## 2017-08-23 LAB — CBC WITH DIFFERENTIAL (CANCER CENTER ONLY)
Basophils Absolute: 0 10*3/uL (ref 0.0–0.1)
Basophils Relative: 0 %
EOS ABS: 0 10*3/uL (ref 0.0–0.5)
EOS PCT: 0 %
HCT: 25.1 % — ABNORMAL LOW (ref 34.8–46.6)
Hemoglobin: 7.6 g/dL — ABNORMAL LOW (ref 11.6–15.9)
LYMPHS PCT: 6 %
Lymphs Abs: 0.4 10*3/uL — ABNORMAL LOW (ref 0.9–3.3)
MCH: 21.7 pg — AB (ref 25.1–34.0)
MCHC: 30.2 g/dL — AB (ref 31.5–36.0)
MCV: 71.8 fL — ABNORMAL LOW (ref 79.5–101.0)
MONO ABS: 0.7 10*3/uL (ref 0.1–0.9)
MONOS PCT: 10 %
Neutro Abs: 6 10*3/uL (ref 1.5–6.5)
Neutrophils Relative %: 84 %
Platelet Count: 277 10*3/uL (ref 145–400)
RBC: 3.5 MIL/uL — ABNORMAL LOW (ref 3.70–5.45)
RDW: 27.5 % — AB (ref 11.2–14.5)
WBC: 7.2 10*3/uL (ref 3.9–10.3)

## 2017-08-23 LAB — SAMPLE TO BLOOD BANK

## 2017-08-23 LAB — PREPARE RBC (CROSSMATCH)

## 2017-08-23 MED ORDER — SODIUM CHLORIDE 0.9% FLUSH: 10.0000 mL | INTRAVENOUS | Status: DC | PRN

## 2017-08-23 MED ORDER — HEPARIN SOD (PORK) LOCK FLUSH 100 UNIT/ML IV SOLN: 500.0000 [IU] | Freq: Every day | INTRAVENOUS | Status: DC | PRN

## 2017-08-23 MED ORDER — SODIUM CHLORIDE 0.9 % IV SOLN
250.0000 mL | Freq: Once | INTRAVENOUS | Status: AC
  Administered 2017-08-23: 250 mL via INTRAVENOUS

## 2017-08-23 MED ORDER — ACETAMINOPHEN 325 MG PO TABS
650.0000 mg | ORAL_TABLET | Freq: Once | ORAL | Status: AC
  Administered 2017-08-23: 650 mg via ORAL
  Filled 2017-08-23: qty 2

## 2017-08-23 MED ORDER — SODIUM CHLORIDE 0.9% FLUSH: 3.0000 mL | INTRAVENOUS | Status: DC | PRN

## 2017-08-23 MED ORDER — HEPARIN SOD (PORK) LOCK FLUSH 100 UNIT/ML IV SOLN: 250.0000 [IU] | INTRAVENOUS | Status: DC | PRN

## 2017-08-23 MED ORDER — HEPARIN SOD (PORK) LOCK FLUSH 100 UNIT/ML IV SOLN
500.0000 [IU] | Freq: Once | INTRAVENOUS | Status: AC | PRN
  Administered 2017-08-23: 500 [IU]
  Filled 2017-08-23: qty 5

## 2017-08-23 MED ORDER — SODIUM CHLORIDE 0.9% FLUSH
10.0000 mL | INTRAVENOUS | Status: DC | PRN
  Administered 2017-08-23: 10 mL
  Filled 2017-08-23: qty 10

## 2017-08-23 NOTE — Progress Notes (Unsigned)
Attended chemo class  Please see Education tab for details

## 2017-08-23 NOTE — Progress Notes (Signed)
Pt arrived to receive 2 units PRBCs per order; units infused with no complications noted  Ordering Provider: Natale Lay.  Associated Diagnosis: Symptomatic anemia  Pt alert, oriented, and ambulatory upon discharge; accompanied by family

## 2017-08-23 NOTE — Discharge Instructions (Signed)

## 2017-08-24 LAB — TYPE AND SCREEN
ABO/RH(D): O POS
Antibody Screen: NEGATIVE
UNIT DIVISION: 0
Unit division: 0

## 2017-08-24 LAB — BPAM RBC
BLOOD PRODUCT EXPIRATION DATE: 201902282359
Blood Product Expiration Date: 201903012359
ISSUE DATE / TIME: 201902011115
ISSUE DATE / TIME: 201902011115
UNIT TYPE AND RH: 5100
UNIT TYPE AND RH: 5100

## 2017-08-26 ENCOUNTER — Telehealth: Payer: Self-pay

## 2017-08-26 ENCOUNTER — Other Ambulatory Visit: Payer: Self-pay

## 2017-08-26 MED ORDER — MIRTAZAPINE 30 MG PO TABS: 30.0000 mg | ORAL_TABLET | Freq: Every day | ORAL | 0 refills | Status: DC

## 2017-08-26 NOTE — Telephone Encounter (Signed)
1) I do not recommend iron supplement noe 2) Please call in Remeron 15 mg qhs PO x 30 days for sleep and appetite

## 2017-08-26 NOTE — Telephone Encounter (Signed)
Called and left message to call her back..  Called back. She is anxious and having a lot of anxiety. Unable to sleep, only slept for a few hours over the weekend. She said she has taken klonopin in the past. She thinks maybe she should to to ER if she does not get any sleep soon.  Does Dr. Alvy Bimler have a suggestion on a iron supplement she could take.

## 2017-08-26 NOTE — Telephone Encounter (Signed)
Called with below message. Verbalized understanding. Rx to pharmacy

## 2017-08-30 ENCOUNTER — Inpatient Hospital Stay: Payer: BC Managed Care – PPO

## 2017-08-30 ENCOUNTER — Other Ambulatory Visit: Payer: Self-pay | Admitting: Hematology and Oncology

## 2017-08-30 VITALS — BP 116/69 | HR 93 | Temp 98.9°F | Resp 18

## 2017-08-30 DIAGNOSIS — D500 Iron deficiency anemia secondary to blood loss (chronic): Secondary | ICD-10-CM

## 2017-08-30 DIAGNOSIS — C787 Secondary malignant neoplasm of liver and intrahepatic bile duct: Secondary | ICD-10-CM

## 2017-08-30 DIAGNOSIS — C530 Malignant neoplasm of endocervix: Secondary | ICD-10-CM | POA: Diagnosis not present

## 2017-08-30 DIAGNOSIS — Z7189 Other specified counseling: Secondary | ICD-10-CM

## 2017-08-30 MED ORDER — PALONOSETRON HCL INJECTION 0.25 MG/5ML
0.2500 mg | Freq: Once | INTRAVENOUS | Status: AC
  Administered 2017-08-30: 0.25 mg via INTRAVENOUS

## 2017-08-30 MED ORDER — PACLITAXEL CHEMO INJECTION 300 MG/50ML
135.0000 mg/m2 | Freq: Once | INTRAVENOUS | Status: AC
  Administered 2017-08-30: 210 mg via INTRAVENOUS
  Filled 2017-08-30: qty 35

## 2017-08-30 MED ORDER — HEPARIN SOD (PORK) LOCK FLUSH 100 UNIT/ML IV SOLN
500.0000 [IU] | Freq: Once | INTRAVENOUS | Status: AC | PRN
  Administered 2017-08-30: 500 [IU]
  Filled 2017-08-30: qty 5

## 2017-08-30 MED ORDER — ACETAMINOPHEN 325 MG PO TABS
650.0000 mg | ORAL_TABLET | Freq: Once | ORAL | Status: AC
  Administered 2017-08-30: 650 mg via ORAL

## 2017-08-30 MED ORDER — FOSAPREPITANT DIMEGLUMINE INJECTION 150 MG
Freq: Once | INTRAVENOUS | Status: AC
  Administered 2017-08-30: 13:00:00 via INTRAVENOUS
  Filled 2017-08-30: qty 5

## 2017-08-30 MED ORDER — SODIUM CHLORIDE 0.9% FLUSH
10.0000 mL | INTRAVENOUS | Status: DC | PRN
  Administered 2017-08-30: 10 mL
  Filled 2017-08-30: qty 10

## 2017-08-30 MED ORDER — SODIUM CHLORIDE 0.9 % IV SOLN
135.0000 mg/m2 | Freq: Once | INTRAVENOUS | Status: DC
  Filled 2017-08-30: qty 35

## 2017-08-30 MED ORDER — ACETAMINOPHEN 325 MG PO TABS
ORAL_TABLET | ORAL | Status: AC
  Filled 2017-08-30: qty 2

## 2017-08-30 MED ORDER — FAMOTIDINE IN NACL 20-0.9 MG/50ML-% IV SOLN
20.0000 mg | Freq: Once | INTRAVENOUS | Status: AC
  Administered 2017-08-30: 20 mg via INTRAVENOUS

## 2017-08-30 MED ORDER — DIPHENHYDRAMINE HCL 50 MG/ML IJ SOLN: 50.0000 mg | Freq: Once | INTRAMUSCULAR | Status: DC

## 2017-08-30 MED ORDER — FAMOTIDINE IN NACL 20-0.9 MG/50ML-% IV SOLN
INTRAVENOUS | Status: AC
  Filled 2017-08-30: qty 50

## 2017-08-30 MED ORDER — SODIUM CHLORIDE 0.9 % IV SOLN
Freq: Once | INTRAVENOUS | Status: AC
  Administered 2017-08-30: 09:00:00 via INTRAVENOUS

## 2017-08-30 MED ORDER — SODIUM CHLORIDE 0.9 % IV SOLN
50.0000 mg/m2 | Freq: Once | INTRAVENOUS | Status: DC
  Filled 2017-08-30: qty 78

## 2017-08-30 MED ORDER — PALONOSETRON HCL INJECTION 0.25 MG/5ML
INTRAVENOUS | Status: AC
  Filled 2017-08-30: qty 5

## 2017-08-30 MED ORDER — POTASSIUM CHLORIDE 2 MEQ/ML IV SOLN
Freq: Once | INTRAVENOUS | Status: AC
  Administered 2017-08-30: 10:00:00 via INTRAVENOUS
  Filled 2017-08-30: qty 10

## 2017-08-30 NOTE — Patient Instructions (Addendum)
[facility] Discharge Instructions for Patients Receiving Chemotherapy  Today you received the following chemotherapy agents Taxol and cisplatin  To help prevent nausea and vomiting after your treatment, we encourage you to take your nausea medication as directed   If you develop nausea and vomiting that is not controlled by your nausea medication, call the clinic.   BELOW ARE SYMPTOMS THAT SHOULD BE REPORTED IMMEDIATELY:  *FEVER GREATER THAN 100.5 F  *CHILLS WITH OR WITHOUT FEVER  NAUSEA AND VOMITING THAT IS NOT CONTROLLED WITH YOUR NAUSEA MEDICATION  *UNUSUAL SHORTNESS OF BREATH  *UNUSUAL BRUISING OR BLEEDING  TENDERNESS IN MOUTH AND THROAT WITH OR WITHOUT PRESENCE OF ULCERS  *URINARY PROBLEMS  *BOWEL PROBLEMS  UNUSUAL RASH Items with * indicate a potential emergency and should be followed up as soon as possible.  Feel free to call the clinic should you have any questions or concerns. The clinic phone number is [phone].  Please show the [facility] at check-in to the Emergency Department and triage nurse.    Paclitaxel injection What is this medicine? PACLITAXEL (PAK li TAX el) is a chemotherapy drug. It targets fast dividing cells, like cancer cells, and causes these cells to die. This medicine is used to treat ovarian cancer, breast cancer, and other cancers. This medicine may be used for other purposes; ask your health care provider or pharmacist if you have questions. COMMON BRAND NAME(S): Onxol, Taxol What should I tell my health care provider before I take this medicine? They need to know if you have any of these conditions: -blood disorders -irregular heartbeat -infection (especially a virus infection such as chickenpox, cold sores, or herpes) -liver disease -previous or ongoing radiation therapy -an unusual or allergic reaction to paclitaxel, alcohol, polyoxyethylated castor oil, other chemotherapy agents, other medicines, foods,  dyes, or preservatives -pregnant or trying to get pregnant -breast-feeding How should I use this medicine? This drug is given as an infusion into a vein. It is administered in a hospital or clinic by a specially trained health care professional. Talk to your pediatrician regarding the use of this medicine in children. Special care may be needed. Overdosage: If you think you have taken too much of this medicine contact a poison control center or emergency room at once. NOTE: This medicine is only for you. Do not share this medicine with others. What if I miss a dose? It is important not to miss your dose. Call your doctor or health care professional if you are unable to keep an appointment. What may interact with this medicine? Do not take this medicine with any of the following medications: -disulfiram -metronidazole This medicine may also interact with the following medications: -cyclosporine -diazepam -ketoconazole -medicines to increase blood counts like filgrastim, pegfilgrastim, sargramostim -other chemotherapy drugs like cisplatin, doxorubicin, epirubicin, etoposide, teniposide, vincristine -quinidine -testosterone -vaccines -verapamil Talk to your doctor or health care professional before taking any of these medicines: -acetaminophen -aspirin -ibuprofen -ketoprofen -naproxen This list may not describe all possible interactions. Give your health care provider a list of all the medicines, herbs, non-prescription drugs, or dietary supplements you use. Also tell them if you smoke, drink alcohol, or use illegal drugs. Some items may interact with your medicine. What should I watch for while using this medicine? Your condition will be monitored carefully while you are receiving this medicine. You will need important blood work done while you are taking this medicine. This medicine can cause serious allergic reactions. To reduce your risk you  will need to take other medicine(s)  before treatment with this medicine. If you experience allergic reactions like skin rash, itching or hives, swelling of the face, lips, or tongue, tell your doctor or health care professional right away. In some cases, you may be given additional medicines to help with side effects. Follow all directions for their use. This drug may make you feel generally unwell. This is not uncommon, as chemotherapy can affect healthy cells as well as cancer cells. Report any side effects. Continue your course of treatment even though you feel ill unless your doctor tells you to stop. Call your doctor or health care professional for advice if you get a fever, chills or sore throat, or other symptoms of a cold or flu. Do not treat yourself. This drug decreases your body's ability to fight infections. Try to avoid being around people who are sick. This medicine may increase your risk to bruise or bleed. Call your doctor or health care professional if you notice any unusual bleeding. Be careful brushing and flossing your teeth or using a toothpick because you may get an infection or bleed more easily. If you have any dental work done, tell your dentist you are receiving this medicine. Avoid taking products that contain aspirin, acetaminophen, ibuprofen, naproxen, or ketoprofen unless instructed by your doctor. These medicines may hide a fever. Do not become pregnant while taking this medicine. Women should inform their doctor if they wish to become pregnant or think they might be pregnant. There is a potential for serious side effects to an unborn child. Talk to your health care professional or pharmacist for more information. Do not breast-feed an infant while taking this medicine. Men are advised not to father a child while receiving this medicine. This product may contain alcohol. Ask your pharmacist or healthcare provider if this medicine contains alcohol. Be sure to tell all healthcare providers you are taking this  medicine. Certain medicines, like metronidazole and disulfiram, can cause an unpleasant reaction when taken with alcohol. The reaction includes flushing, headache, nausea, vomiting, sweating, and increased thirst. The reaction can last from 30 minutes to several hours. What side effects may I notice from receiving this medicine? Side effects that you should report to your doctor or health care professional as soon as possible: -allergic reactions like skin rash, itching or hives, swelling of the face, lips, or tongue -low blood counts - This drug may decrease the number of white blood cells, red blood cells and platelets. You may be at increased risk for infections and bleeding. -signs of infection - fever or chills, cough, sore throat, pain or difficulty passing urine -signs of decreased platelets or bleeding - bruising, pinpoint red spots on the skin, black, tarry stools, nosebleeds -signs of decreased red blood cells - unusually weak or tired, fainting spells, lightheadedness -breathing problems -chest pain -high or low blood pressure -mouth sores -nausea and vomiting -pain, swelling, redness or irritation at the injection site -pain, tingling, numbness in the hands or feet -slow or irregular heartbeat -swelling of the ankle, feet, hands Side effects that usually do not require medical attention (report to your doctor or health care professional if they continue or are bothersome): -bone pain -complete hair loss including hair on your head, underarms, pubic hair, eyebrows, and eyelashes -changes in the color of fingernails -diarrhea -loosening of the fingernails -loss of appetite -muscle or joint pain -red flush to skin -sweating This list may not describe all possible side effects. Call your doctor for   medical advice about side effects. You may report side effects to FDA at 1-800-FDA-1088. Where should I keep my medicine? This drug is given in a hospital or clinic and will not be  stored at home. NOTE: This sheet is a summary. It may not cover all possible information. If you have questions about this medicine, talk to your doctor, pharmacist, or health care provider.  2018 [employer]vier/Gold Standard (2015-05-10 19:58:00)

## 2017-08-30 NOTE — Progress Notes (Signed)
Pt had total  urine output of 164ml, but pt is claiming she peed about 157ml before we started the hydration, Dr. Alvy Bimler is aware and okay to proceed with the treatment.  Okay to treat with labs from 08/23/17 and with HR at 120's per Dr. Alvy Bimler.

## 2017-09-02 ENCOUNTER — Other Ambulatory Visit: Payer: Self-pay | Admitting: Hematology and Oncology

## 2017-09-02 ENCOUNTER — Inpatient Hospital Stay: Payer: BC Managed Care – PPO

## 2017-09-02 VITALS — BP 110/60 | HR 100 | Temp 99.2°F | Resp 18

## 2017-09-02 DIAGNOSIS — C530 Malignant neoplasm of endocervix: Secondary | ICD-10-CM

## 2017-09-02 DIAGNOSIS — C787 Secondary malignant neoplasm of liver and intrahepatic bile duct: Secondary | ICD-10-CM

## 2017-09-02 DIAGNOSIS — Z7189 Other specified counseling: Secondary | ICD-10-CM

## 2017-09-02 DIAGNOSIS — D500 Iron deficiency anemia secondary to blood loss (chronic): Secondary | ICD-10-CM

## 2017-09-02 DIAGNOSIS — C541 Malignant neoplasm of endometrium: Secondary | ICD-10-CM

## 2017-09-02 LAB — CBC WITH DIFFERENTIAL (CANCER CENTER ONLY)
BASOS ABS: 0 10*3/uL (ref 0.0–0.1)
BASOS PCT: 0 %
EOS ABS: 0 10*3/uL (ref 0.0–0.5)
EOS PCT: 0 %
HCT: 30.6 % — ABNORMAL LOW (ref 34.8–46.6)
Hemoglobin: 9.5 g/dL — ABNORMAL LOW (ref 11.6–15.9)
Lymphocytes Relative: 4 %
Lymphs Abs: 0.6 10*3/uL — ABNORMAL LOW (ref 0.9–3.3)
MCH: 23.2 pg — ABNORMAL LOW (ref 25.1–34.0)
MCHC: 31 g/dL — ABNORMAL LOW (ref 31.5–36.0)
MCV: 74.6 fL — ABNORMAL LOW (ref 79.5–101.0)
Monocytes Absolute: 0.1 10*3/uL (ref 0.1–0.9)
Monocytes Relative: 0 %
Neutro Abs: 12.6 10*3/uL — ABNORMAL HIGH (ref 1.5–6.5)
Neutrophils Relative %: 96 %
PLATELETS: 201 10*3/uL (ref 145–400)
RBC: 4.1 MIL/uL (ref 3.70–5.45)
RDW: 24.9 % — AB (ref 11.2–14.5)
WBC: 13.3 10*3/uL — AB (ref 3.9–10.3)
nRBC: 0 /100 WBC

## 2017-09-02 LAB — CMP (CANCER CENTER ONLY)
ALK PHOS: 291 U/L — AB (ref 40–150)
ALT: 18 U/L (ref 0–55)
AST: 89 U/L — AB (ref 5–34)
Albumin: 2.7 g/dL — ABNORMAL LOW (ref 3.5–5.0)
Anion gap: 10 (ref 3–11)
BILIRUBIN TOTAL: 0.8 mg/dL (ref 0.2–1.2)
BUN: 13 mg/dL (ref 7–26)
CALCIUM: 8 mg/dL — AB (ref 8.4–10.4)
CO2: 24 mmol/L (ref 22–29)
Chloride: 99 mmol/L (ref 98–109)
Creatinine: 0.55 mg/dL — ABNORMAL LOW (ref 0.60–1.10)
GFR, Est AFR Am: >60 mL/min (ref >60)
Glucose, Bld: 92 mg/dL (ref 70–140)
Potassium: 4.3 mmol/L (ref 3.5–5.1)
Sodium: 133 mmol/L — ABNORMAL LOW (ref 136–145)
TOTAL PROTEIN: 6 g/dL — AB (ref 6.4–8.3)

## 2017-09-02 LAB — SAMPLE TO BLOOD BANK

## 2017-09-02 MED ORDER — HEPARIN SOD (PORK) LOCK FLUSH 100 UNIT/ML IV SOLN
500.0000 [IU] | Freq: Once | INTRAVENOUS | Status: AC | PRN
  Administered 2017-09-02: 500 [IU]
  Filled 2017-09-02: qty 5

## 2017-09-02 MED ORDER — PALONOSETRON HCL INJECTION 0.25 MG/5ML
0.2500 mg | Freq: Once | INTRAVENOUS | Status: AC
  Administered 2017-09-02: 0.25 mg via INTRAVENOUS

## 2017-09-02 MED ORDER — POTASSIUM CHLORIDE 2 MEQ/ML IV SOLN
Freq: Once | INTRAVENOUS | Status: AC
  Administered 2017-09-02: 11:00:00 via INTRAVENOUS
  Filled 2017-09-02: qty 10

## 2017-09-02 MED ORDER — FOSAPREPITANT DIMEGLUMINE INJECTION 150 MG
Freq: Once | INTRAVENOUS | Status: AC
  Administered 2017-09-02: 13:00:00 via INTRAVENOUS
  Filled 2017-09-02: qty 5

## 2017-09-02 MED ORDER — SODIUM CHLORIDE 0.9 % IV SOLN
50.0000 mg/m2 | Freq: Once | INTRAVENOUS | Status: AC
  Administered 2017-09-02: 78 mg via INTRAVENOUS
  Filled 2017-09-02: qty 78

## 2017-09-02 MED ORDER — SODIUM CHLORIDE 0.9 % IV SOLN
Freq: Once | INTRAVENOUS | Status: AC
Start: 2017-09-02 — End: 2017-09-02
  Administered 2017-09-02: 09:00:00 via INTRAVENOUS

## 2017-09-02 MED ORDER — PALONOSETRON HCL INJECTION 0.25 MG/5ML
INTRAVENOUS | Status: AC
  Filled 2017-09-02: qty 5

## 2017-09-02 MED ORDER — SODIUM CHLORIDE 0.9% FLUSH
10.0000 mL | INTRAVENOUS | Status: DC | PRN
  Filled 2017-09-02: qty 10

## 2017-09-02 MED ORDER — SODIUM CHLORIDE 0.9% FLUSH
10.0000 mL | INTRAVENOUS | Status: DC | PRN
  Administered 2017-09-02 (×2): 10 mL
  Filled 2017-09-02: qty 10

## 2017-09-02 NOTE — Patient Instructions (Signed)
[facility] Discharge Instructions for Patients Receiving Chemotherapy  Today you received the following chemotherapy agents cisplatin.  To help prevent nausea and vomiting after your treatment, we encourage you to take your nausea medication as directed.   If you develop nausea and vomiting that is not controlled by your nausea medication, call the clinic.   BELOW ARE SYMPTOMS THAT SHOULD BE REPORTED IMMEDIATELY:  *FEVER GREATER THAN 100.5 F  *CHILLS WITH OR WITHOUT FEVER  NAUSEA AND VOMITING THAT IS NOT CONTROLLED WITH YOUR NAUSEA MEDICATION  *UNUSUAL SHORTNESS OF BREATH  *UNUSUAL BRUISING OR BLEEDING  TENDERNESS IN MOUTH AND THROAT WITH OR WITHOUT PRESENCE OF ULCERS  *URINARY PROBLEMS  *BOWEL PROBLEMS  UNUSUAL RASH Items with * indicate a potential emergency and should be followed up as soon as possible.  Feel free to call the clinic should you have any questions or concerns. The clinic phone number is [phone].  Please show the CHEMO ALERT CARD at check-in to the Emergency Department and triage nurse.  Cisplatin injection What is this medicine? CISPLATIN (SIS pla tin) is a chemotherapy drug. It targets fast dividing cells, like cancer cells, and causes these cells to die. This medicine is used to treat many types of cancer like bladder, ovarian, and testicular cancers. This medicine may be used for other purposes; ask your health care provider or pharmacist if you have questions. COMMON BRAND NAME(S): Platinol, Platinol -AQ What should I tell my health care provider before I take this medicine? They need to know if you have any of these conditions: -blood disorders -hearing problems -kidney disease -recent or ongoing radiation therapy -an unusual or allergic reaction to cisplatin, carboplatin, other chemotherapy, other medicines, foods, dyes, or preservatives -pregnant or trying to get pregnant -breast-feeding How should I use this  medicine? This drug is given as an infusion into a vein. It is administered in a hospital or clinic by a specially trained health care professional. Talk to your pediatrician regarding the use of this medicine in children. Special care may be needed. Overdosage: If you think you have taken too much of this medicine contact a poison control center or emergency room at once. NOTE: This medicine is only for you. Do not share this medicine with others. What if I miss a dose? It is important not to miss a dose. Call your doctor or health care professional if you are unable to keep an appointment. What may interact with this medicine? -dofetilide -foscarnet -medicines for seizures -medicines to increase blood counts like filgrastim, pegfilgrastim, sargramostim -probenecid -pyridoxine used with altretamine -rituximab -some antibiotics like amikacin, gentamicin, neomycin, polymyxin B, streptomycin, tobramycin -sulfinpyrazone -vaccines -zalcitabine Talk to your doctor or health care professional before taking any of these medicines: -acetaminophen -aspirin -ibuprofen -ketoprofen -naproxen This list may not describe all possible interactions. Give your health care provider a list of all the medicines, herbs, non-prescription drugs, or dietary supplements you use. Also tell them if you smoke, drink alcohol, or use illegal drugs. Some items may interact with your medicine. What should I watch for while using this medicine? Your condition will be monitored carefully while you are receiving this medicine. You will need important blood work done while you are taking this medicine. This drug may make you feel generally unwell. This is not uncommon, as chemotherapy can affect healthy cells as well as cancer cells. Report any side effects. Continue your course of treatment even though you feel ill unless your doctor tells you to stop.   In some cases, you may be given additional medicines to help with side  effects. Follow all directions for their use. Call your doctor or health care professional for advice if you get a fever, chills or sore throat, or other symptoms of a cold or flu. Do not treat yourself. This drug decreases your body's ability to fight infections. Try to avoid being around people who are sick. This medicine may increase your risk to bruise or bleed. Call your doctor or health care professional if you notice any unusual bleeding. Be careful brushing and flossing your teeth or using a toothpick because you may get an infection or bleed more easily. If you have any dental work done, tell your dentist you are receiving this medicine. Avoid taking products that contain aspirin, acetaminophen, ibuprofen, naproxen, or ketoprofen unless instructed by your doctor. These medicines may hide a fever. Do not become pregnant while taking this medicine. Women should inform their doctor if they wish to become pregnant or think they might be pregnant. There is a potential for serious side effects to an unborn child. Talk to your health care professional or pharmacist for more information. Do not breast-feed an infant while taking this medicine. Drink fluids as directed while you are taking this medicine. This will help protect your kidneys. Call your doctor or health care professional if you get diarrhea. Do not treat yourself. What side effects may I notice from receiving this medicine? Side effects that you should report to your doctor or health care professional as soon as possible: -allergic reactions like skin rash, itching or hives, swelling of the face, lips, or tongue -signs of infection - fever or chills, cough, sore throat, pain or difficulty passing urine -signs of decreased platelets or bleeding - bruising, pinpoint red spots on the skin, black, tarry stools, nosebleeds -signs of decreased red blood cells - unusually weak or tired, fainting spells, lightheadedness -breathing  problems -changes in hearing -gout pain -low blood counts - This drug may decrease the number of white blood cells, red blood cells and platelets. You may be at increased risk for infections and bleeding. -nausea and vomiting -pain, swelling, redness or irritation at the injection site -pain, tingling, numbness in the hands or feet -problems with balance, movement -trouble passing urine or change in the amount of urine Side effects that usually do not require medical attention (report to your doctor or health care professional if they continue or are bothersome): -changes in vision -loss of appetite -metallic taste in the mouth or changes in taste This list may not describe all possible side effects. Call your doctor for medical advice about side effects. You may report side effects to FDA at 1-800-FDA-1088. Where should I keep my medicine? This drug is given in a hospital or clinic and will not be stored at home. NOTE: This sheet is a summary. It may not cover all possible information. If you have questions about this medicine, talk to your doctor, pharmacist, or health care provider.  2018 [employer] Standard (2007-10-14 14:40:54)   

## 2017-09-02 NOTE — Progress Notes (Signed)
Pt reports increased swelling in right ankle and foot, on assessment swelling is grade 2 pitting. Dr Alvy Bimler RN Tammi made aware.   Per Dr Alvy Bimler ok to tx with today's labs AST 89

## 2017-09-02 NOTE — Progress Notes (Signed)
OK to treat with AST 89 per Dr Alvy Bimler

## 2017-09-02 NOTE — Progress Notes (Signed)
No new orders in relation to pitting edema. Dr. Alvy Bimler aware.

## 2017-09-04 ENCOUNTER — Telehealth: Payer: Self-pay | Admitting: *Deleted

## 2017-09-04 NOTE — Telephone Encounter (Signed)
Called patient for chemo follow up. States she had diarrhea and took imodium. Is eating and drinking OK. No more diarrhea. Will see Dr Alvy Bimler on Friday as scheduled

## 2017-09-04 NOTE — Telephone Encounter (Signed)
-----   Message from Thomasene Lot, RN sent at 09/02/2017  5:37 PM EST ----- Regarding: Dr Alvy Bimler 1st tx f/u call 1st tx f/u call

## 2017-09-06 ENCOUNTER — Inpatient Hospital Stay (HOSPITAL_BASED_OUTPATIENT_CLINIC_OR_DEPARTMENT_OTHER): Payer: BC Managed Care – PPO | Admitting: Hematology and Oncology

## 2017-09-06 ENCOUNTER — Telehealth: Payer: Self-pay | Admitting: Hematology and Oncology

## 2017-09-06 ENCOUNTER — Inpatient Hospital Stay: Payer: BC Managed Care – PPO

## 2017-09-06 ENCOUNTER — Encounter: Payer: Self-pay | Admitting: Hematology and Oncology

## 2017-09-06 ENCOUNTER — Other Ambulatory Visit: Payer: Self-pay | Admitting: Hematology and Oncology

## 2017-09-06 VITALS — BP 112/68 | HR 93 | Temp 97.9°F | Resp 18 | Ht 64.0 in | Wt 112.3 lb

## 2017-09-06 VITALS — BP 102/64 | HR 95 | Temp 97.8°F | Resp 18

## 2017-09-06 DIAGNOSIS — C787 Secondary malignant neoplasm of liver and intrahepatic bile duct: Secondary | ICD-10-CM

## 2017-09-06 DIAGNOSIS — C530 Malignant neoplasm of endocervix: Secondary | ICD-10-CM

## 2017-09-06 DIAGNOSIS — E440 Moderate protein-calorie malnutrition: Secondary | ICD-10-CM

## 2017-09-06 DIAGNOSIS — R634 Abnormal weight loss: Secondary | ICD-10-CM | POA: Diagnosis not present

## 2017-09-06 DIAGNOSIS — C541 Malignant neoplasm of endometrium: Secondary | ICD-10-CM

## 2017-09-06 DIAGNOSIS — D500 Iron deficiency anemia secondary to blood loss (chronic): Secondary | ICD-10-CM

## 2017-09-06 DIAGNOSIS — R197 Diarrhea, unspecified: Secondary | ICD-10-CM | POA: Diagnosis not present

## 2017-09-06 DIAGNOSIS — Z95828 Presence of other vascular implants and grafts: Secondary | ICD-10-CM

## 2017-09-06 DIAGNOSIS — Z79899 Other long term (current) drug therapy: Secondary | ICD-10-CM | POA: Diagnosis not present

## 2017-09-06 DIAGNOSIS — Z7189 Other specified counseling: Secondary | ICD-10-CM

## 2017-09-06 LAB — CMP (CANCER CENTER ONLY)
ALK PHOS: 232 U/L — AB (ref 40–150)
ALT: 15 U/L (ref 0–55)
ANION GAP: 7 (ref 3–11)
AST: 53 U/L — ABNORMAL HIGH (ref 5–34)
Albumin: 2.7 g/dL — ABNORMAL LOW (ref 3.5–5.0)
BUN: 10 mg/dL (ref 7–26)
CALCIUM: 8.2 mg/dL — AB (ref 8.4–10.4)
CHLORIDE: 101 mmol/L (ref 98–109)
CO2: 25 mmol/L (ref 22–29)
Creatinine: 0.59 mg/dL — ABNORMAL LOW (ref 0.60–1.10)
Glucose, Bld: 97 mg/dL (ref 70–140)
Potassium: 4.1 mmol/L (ref 3.5–5.1)
Sodium: 133 mmol/L — ABNORMAL LOW (ref 136–145)
Total Bilirubin: 0.4 mg/dL (ref 0.2–1.2)
Total Protein: 6.4 g/dL (ref 6.4–8.3)

## 2017-09-06 LAB — CBC WITH DIFFERENTIAL (CANCER CENTER ONLY)
Basophils Absolute: 0 10*3/uL (ref 0.0–0.1)
Basophils Relative: 1 %
Eosinophils Absolute: 0 10*3/uL (ref 0.0–0.5)
Eosinophils Relative: 0 %
HEMATOCRIT: 30.6 % — AB (ref 34.8–46.6)
HEMOGLOBIN: 9.7 g/dL — AB (ref 11.6–15.9)
Lymphocytes Relative: 22 %
Lymphs Abs: 0.9 10*3/uL (ref 0.9–3.3)
MCH: 23.4 pg — ABNORMAL LOW (ref 25.1–34.0)
MCHC: 31.7 g/dL (ref 31.5–36.0)
MCV: 73.8 fL — ABNORMAL LOW (ref 79.5–101.0)
MONOS PCT: 5 %
Monocytes Absolute: 0.2 10*3/uL (ref 0.1–0.9)
NEUTROS ABS: 3.1 10*3/uL (ref 1.5–6.5)
NEUTROS PCT: 72 %
Platelet Count: 344 10*3/uL (ref 145–400)
RBC: 4.15 MIL/uL (ref 3.70–5.45)
RDW: 27 % — ABNORMAL HIGH (ref 11.2–14.5)
WBC: 4.3 10*3/uL (ref 3.9–10.3)

## 2017-09-06 LAB — SAMPLE TO BLOOD BANK

## 2017-09-06 LAB — UA PROTEIN, DIPSTICK - CHCC: PROTEIN: 30 mg/dL — AB

## 2017-09-06 MED ORDER — SODIUM CHLORIDE 0.9% FLUSH
10.0000 mL | Freq: Once | INTRAVENOUS | Status: AC
  Administered 2017-09-06: 10 mL via INTRAVENOUS
  Filled 2017-09-06: qty 10

## 2017-09-06 MED ORDER — SODIUM CHLORIDE 0.9% FLUSH
10.0000 mL | INTRAVENOUS | Status: DC | PRN
  Filled 2017-09-06: qty 10

## 2017-09-06 MED ORDER — HEPARIN SOD (PORK) LOCK FLUSH 100 UNIT/ML IV SOLN
500.0000 [IU] | Freq: Once | INTRAVENOUS | Status: AC | PRN
  Administered 2017-09-06: 500 [IU]
  Filled 2017-09-06: qty 5

## 2017-09-06 MED ORDER — HEPARIN SOD (PORK) LOCK FLUSH 100 UNIT/ML IV SOLN
500.0000 [IU] | Freq: Once | INTRAVENOUS | Status: DC
  Filled 2017-09-06: qty 5

## 2017-09-06 MED ORDER — ALTEPLASE 2 MG IJ SOLR
2.0000 mg | Freq: Once | INTRAMUSCULAR | Status: DC | PRN
  Filled 2017-09-06: qty 2

## 2017-09-06 MED ORDER — SODIUM CHLORIDE 0.9 % IV SOLN
Freq: Once | INTRAVENOUS | Status: AC
  Administered 2017-09-06: 12:00:00 via INTRAVENOUS

## 2017-09-06 MED ORDER — SODIUM CHLORIDE 0.9% FLUSH
3.0000 mL | Freq: Once | INTRAVENOUS | Status: DC | PRN
  Filled 2017-09-06: qty 10

## 2017-09-06 MED ORDER — SODIUM CHLORIDE 0.9 % IV SOLN
510.0000 mg | Freq: Once | INTRAVENOUS | Status: AC
  Administered 2017-09-06: 510 mg via INTRAVENOUS
  Filled 2017-09-06: qty 17

## 2017-09-06 NOTE — Assessment & Plan Note (Signed)
Cause is unknown but could be due to recent constipation.  Observe only and she can use Imodium as needed.  I encourage hydration therapy.

## 2017-09-06 NOTE — Assessment & Plan Note (Signed)
Liver enzymes are mildly abnormal but she is not symptomatic.  Observe

## 2017-09-06 NOTE — Assessment & Plan Note (Signed)
The most likely cause of her anemia is due to chronic blood loss/malabsorption syndrome. We discussed some of the risks, benefits, and alternatives of intravenous iron infusions. The patient is symptomatic from anemia and the iron level is critically low. She tolerated oral iron supplement poorly and desires to achieved higher levels of iron faster for adequate hematopoesis. Some of the side-effects to be expected including risks of infusion reactions, phlebitis, headaches, nausea and fatigue.  The patient is willing to proceed. Patient education material was dispensed.  Goal is to keep ferritin level greater than 50 and resolution of anemia She does not need blood transfusion today

## 2017-09-06 NOTE — Assessment & Plan Note (Signed)
So far, she tolerated treatment well except for diarrhea Her blood counts are holding steady She noticed less bleeding We will continue aggressive supportive care I will see her back prior to cycle 2 of treatment

## 2017-09-06 NOTE — Patient Instructions (Signed)

## 2017-09-06 NOTE — Progress Notes (Signed)
[place] OFFICE PROGRESS NOTE  Patient Care Team: Avel Sensor, MD as PCP - General (Obstetrics and Gynecology)  ASSESSMENT & PLAN:  Cervical cancer [facility]) So far, she tolerated treatment well except for diarrhea Her blood counts are holding steady She noticed less bleeding We will continue aggressive supportive care I will see her back prior to cycle 2 of treatment  Iron deficiency anemia due to chronic blood loss The most likely cause of her anemia is due to chronic blood loss/malabsorption syndrome. We discussed some of the risks, benefits, and alternatives of intravenous iron infusions. The patient is symptomatic from anemia and the iron level is critically low. She tolerated oral iron supplement poorly and desires to achieved higher levels of iron faster for adequate hematopoesis. Some of the side-effects to be expected including risks of infusion reactions, phlebitis, headaches, nausea and fatigue.  The patient is willing to proceed. Patient education material was dispensed.  Goal is to keep ferritin level greater than 50 and resolution of anemia She does not need blood transfusion today   Protein-calorie malnutrition, moderate (HCC) She has severe weight loss She is eating well with good appetite I recommend frequent small meals I will consult dietitian to follow when she comes back for treatment  Diarrhea Cause is unknown but could be due to recent constipation.  Observe only and she can use Imodium as needed.  I encourage hydration therapy.  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[facility]) Liver enzymes are mildly abnormal but she is not symptomatic.  Observe   Orders Placed This Encounter  Procedures  . Magnesium    Standing Status:   Standing    Number of Occurrences:   9    Standing Expiration Date:   09/06/2018  . Ambulatory referral to Nutrition and Diabetic E    Referral Priority:   Routine    Referral Type:   Consultation    Referral Reason:   Specialty  Services Required    Number of Visits Requested:   1    INTERVAL HISTORY: Please see below for problem oriented charting. She returns with her sister for further follow-up She tolerated recent treatment well except for diarrhea She noted less bleeding She denies pain No recent nausea or vomiting.  Denies mucositis.  No recent fevers.  SUMMARY OF ONCOLOGIC HISTORY:   Cervical cancer ([place])   07/22/2017 Initial Diagnosis    The patient was examined in the wellness center.  Upon vaginal exam, friable mass of tissue presumably from the cervix was interpreted as poorly differentiated high-grade malignant neoplasm.  Follow-up workup with ultrasound of the uterus revealed multiple large uterine fibroids.      07/22/2017 Pathology Results    Biopsy confirmed poorly differentiated high-grade malignant neoplasm.      08/06/2017 Imaging    6.6 cm uterine mass in area of cervix and upper vagina, highly suspicious for cervical carcinoma.  Mild bilateral iliac lymphadenopathy, consistent with metastatic disease. No abdominal lymphadenopathy identified.  Multiple large liver masses, with areas of central necrosis or scarring, highly suspicious for liver metastases. Although unlikely, it is conceivable that some of these lesions could represent other etiology such as focal nodular hyperplasia or adenomas. Consider further evaluation with PET-CT, abdomen MRI without and with contrast, or percutaneous needle biopsy.  Multiple large uterine fibroids, many showing cystic degeneration.  Bilateral cystic adnexal lesions with probably benign features. Differential diagnosis includes ovarian cysts, endometriomas, and hydrosalpinges. These could also be further assessed on PET-CT or MRI.  No  evidence of thoracic metastatic disease.       08/09/2017 Tumor Marker    Patient's tumor was tested for the following markers: CA-125 Results of the tumor marker test revealed 79.2      08/14/2017 Pathology  Results    Liver, needle/core biopsy, left hepatic lobe - METASTATIC POORLY DIFFERENTIATED CARCINOMA, SEE COMMENT. Microscopic Comment The tumor is poorly differentiated with abundant necrosis. Pancytokeratin, PAX-8, cytokeratin 7, and p16 are positive. There is weak non-specific staining with TTF-1, synaptophysin (very focal), p63 (very focal). Chromogranin, CD56, S100, desmin, SMA, ER, CD10, cytokeratin 10, cytokeratin 5/6, and cytokeratin 20 are negative. Thus, the immunoprofile is suggest of a gynecologic primary. Dr. Lyndon Code has reviewed the case.       08/21/2017 Procedure    Ultrasound and fluoroscopically guided right internal jugular single lumen power port catheter insertion. Tip in the SVC/RA junction. Catheter ready for use.      08/30/2017 -  Chemotherapy    The patient had received cisplatin and Taxol       REVIEW OF SYSTEMS:   Constitutional: Denies fevers, chills Eyes: Denies blurriness of vision Ears, nose, mouth, throat, and face: Denies mucositis or sore throat Respiratory: Denies cough, dyspnea or wheezes Cardiovascular: Denies palpitation, chest discomfort or lower extremity swelling Gastrointestinal:  Denies nausea, heartburn  Skin: Denies abnormal skin rashes Lymphatics: Denies new lymphadenopathy or easy bruising Neurological:Denies numbness, tingling or new weaknesses Behavioral/Psych: Mood is stable, no new changes  All other systems were reviewed with the patient and are negative.  I have reviewed the past medical history, past surgical history, social history and family history with the patient and they are unchanged from previous note.  ALLERGIES:  is allergic to benadryl [diphenhydramine].  MEDICATIONS:  Current Outpatient Medications  Medication Sig Dispense Refill  . acetaminophen (TYLENOL) 325 MG tablet Take 650 mg by mouth every 6 (six) hours as needed.    . lidocaine-prilocaine (EMLA) cream Apply to affected area once 30 g 3  . mirtazapine  (REMERON) 30 MG tablet Take 1 tablet (30 mg total) by mouth at bedtime. 30 tablet 0  . ondansetron (ZOFRAN) 8 MG tablet Take 1 tablet (8 mg total) by mouth 2 (two) times daily as needed. Start on the third day after chemotherapy. 30 tablet 1  . prochlorperazine (COMPAZINE) 10 MG tablet Take 1 tablet (10 mg total) by mouth every 6 (six) hours as needed for nausea or vomiting. 30 tablet 0   No current facility-administered medications for this visit.    Facility-Administered Medications Ordered in Other Visits  Medication Dose Route Frequency Provider Last Rate Last Dose  . 0.9 %  sodium chloride infusion   Intravenous Once Alvy Bimler, Maybree Riling, MD      . ferumoxytol (FERAHEME) 510 mg in sodium chloride 0.9 % 100 mL IVPB  510 mg Intravenous Once Alvy Bimler, Travus Oren, MD      . heparin lock flush 100 unit/mL  500 Units Intracatheter Once PRN Alvy Bimler, Raedyn Klinck, MD      . sodium chloride flush (NS) 0.9 % injection 3 mL  3 mL Intravenous Once PRN Alvy Bimler, Linell Shawn, MD        PHYSICAL EXAMINATION: ECOG PERFORMANCE STATUS: 1 - Symptomatic but completely ambulatory  Vitals:   09/06/17 1134  BP: 112/68  Pulse: 93  Resp: 18  Temp: 97.9 F (36.6 C)  SpO2: 100%   Filed Weights   09/06/17 1134  Weight: 112 lb 4.8 oz (50.9 kg)    GENERAL:alert, no distress and comfortable.  She looks thin and cachectic SKIN: skin color, texture, turgor are normal, no rashes or significant lesions EYES: normal, Conjunctiva are pale and non-injected, sclera clear OROPHARYNX:no exudate, no erythema and lips, buccal mucosa, and tongue normal  NECK: supple, thyroid normal size, non-tender, without nodularity LYMPH:  no palpable lymphadenopathy in the cervical, axillary or inguinal LUNGS: clear to auscultation and percussion with normal breathing effort HEART: regular rate & rhythm and no murmurs and no lower extremity edema ABDOMEN:abdomen soft, non-tender and normal bowel sounds.  Palpable tumor in the right upper quadrant Musculoskeletal:no  cyanosis of digits and no clubbing  NEURO: alert & oriented x 3 with fluent speech, no focal motor/sensory deficits  LABORATORY DATA:  I have reviewed the data as listed    Component Value Date/Time   NA 133 (L) 09/06/2017 1026   K 4.1 09/06/2017 1026   CL 101 09/06/2017 1026   CO2 25 09/06/2017 1026   GLUCOSE 97 09/06/2017 1026   BUN 10 09/06/2017 1026   CREATININE 0.59 (L) 09/06/2017 1026   CALCIUM 8.2 (L) 09/06/2017 1026   PROT 6.4 09/06/2017 1026   ALBUMIN 2.7 (L) 09/06/2017 1026   AST 53 (H) 09/06/2017 1026   ALT 15 09/06/2017 1026   ALKPHOS 232 (H) 09/06/2017 1026   BILITOT 0.4 09/06/2017 1026   GFRNONAA >60 09/06/2017 1026   GFRAA >60 09/06/2017 1026    No results found for: SPEP, UPEP  Lab Results  Component Value Date   WBC 4.3 09/06/2017   NEUTROABS 3.1 09/06/2017   HGB 8.1 (L) 08/21/2017   HCT 30.6 (L) 09/06/2017   MCV 73.8 (L) 09/06/2017   PLT 344 09/06/2017      Chemistry      Component Value Date/Time   NA 133 (L) 09/06/2017 1026   K 4.1 09/06/2017 1026   CL 101 09/06/2017 1026   CO2 25 09/06/2017 1026   BUN 10 09/06/2017 1026   CREATININE 0.59 (L) 09/06/2017 1026      Component Value Date/Time   CALCIUM 8.2 (L) 09/06/2017 1026   ALKPHOS 232 (H) 09/06/2017 1026   AST 53 (H) 09/06/2017 1026   ALT 15 09/06/2017 1026   BILITOT 0.4 09/06/2017 1026       RADIOGRAPHIC STUDIES: I have personally reviewed the radiological images as listed and agreed with the findings in the report. US Biopsy (liver)  Result Date: 08/14/2017 INDICATION: 47 year old with uterine cancer and large liver masses. EXAM: ULTRASOUND-GUIDED LIVER LESION BIOPSY MEDICATIONS: None. ANESTHESIA/SEDATION: Moderate (conscious) sedation was employed during this procedure. A total of Versed 2.0 mg and Fentanyl 100 mcg was administered intravenously. Moderate Sedation Time: 20 minutes. The patient's level of consciousness and vital signs were monitored continuously by radiology nursing  throughout the procedure under my direct supervision. FLUOROSCOPY TIME:  None COMPLICATIONS: None immediate. PROCEDURE: Informed written consent was obtained from the patient after a thorough discussion of the procedural risks, benefits and alternatives. All questions were addressed. A timeout was performed prior to the initiation of the procedure. Liver was evaluated with ultrasound. Two large lesions were identified in left hepatic lobe. The more cephalad lesion was targeted for biopsy. Left side of the abdomen was prepped with chlorhexidine and a sterile field was created. Skin and soft tissues were anesthetized with 1% lidocaine. 51 gauge coaxial needle was directed into the lesion with ultrasound guidance while traversing normal liver tissue. A total of 5 core biopsies were obtained with an 18 gauge device. Specimens placed in formalin. Gel-Foam slurry was  injected through the 17 gauge needle as it was removed. Bandage placed over the puncture site. FINDINGS: Numerous large hepatic lesions. Core biopsies obtained from the more cephalad left hepatic lesion. No evidence for bleeding or hematoma formation. IMPRESSION: Ultrasound-guided core biopsies of a left hepatic lesion. Electronically Signed   By: Markus Daft M.D.   On: 08/14/2017 13:57   Ir US Guide Vasc Access Right  Result Date: 08/21/2017 CLINICAL DATA:  METASTATIC ENDOMETRIAL CARCINOMA EXAM: RIGHT INTERNAL JUGULAR SINGLE LUMEN POWER PORT CATHETER INSERTION Date:  08/21/2017 08/21/2017 12:50 pm Radiologist:  Jerilynn Mages. Daryll Brod, MD Guidance:  Ultrasound and fluoroscopic MEDICATIONS: Ancef 2 g; The antibiotic was administered within an appropriate time interval prior to skin puncture. ANESTHESIA/SEDATION: Versed 2.0 mg IV; Fentanyl 100 mcg IV; Moderate Sedation Time:  27 minutes The patient was continuously monitored during the procedure by the interventional radiology nurse under my direct supervision. FLUOROSCOPY TIME:  One minutes, 6 seconds (5 mGy)  COMPLICATIONS: None immediate. CONTRAST:  None. PROCEDURE: Informed consent was obtained from the patient following explanation of the procedure, risks, benefits and alternatives. The patient understands, agrees and consents for the procedure. All questions were addressed. A time out was performed. Maximal barrier sterile technique utilized including caps, mask, sterile gowns, sterile gloves, large sterile drape, hand hygiene, and 2% chlorhexidine scrub. Under sterile conditions and local anesthesia, right internal jugular micropuncture venous access was performed. Access was performed with ultrasound. Images were obtained for documentation. A guide wire was inserted followed by a transitional dilator. This allowed insertion of a guide wire and catheter into the IVC. Measurements were obtained from the SVC / RA junction back to the right IJ venotomy site. In the right infraclavicular chest, a subcutaneous pocket was created over the second anterior rib. This was done under sterile conditions and local anesthesia. 1% lidocaine with epinephrine was utilized for this. A 2.5 cm incision was made in the skin. Blunt dissection was performed to create a subcutaneous pocket over the right pectoralis major muscle. The pocket was flushed with saline vigorously. There was adequate hemostasis. The port catheter was assembled and checked for leakage. The port catheter was secured in the pocket with two retention sutures. The tubing was tunneled subcutaneously to the right venotomy site and inserted into the SVC/RA junction through a valved peel-away sheath. Position was confirmed with fluoroscopy. Images were obtained for documentation. The patient tolerated the procedure well. No immediate complications. Incisions were closed in a two layer fashion with 4 - 0 Vicryl suture. Dermabond was applied to the skin. The port catheter was accessed, blood was aspirated followed by saline and heparin flushes. Needle was removed. A dry  sterile dressing was applied. IMPRESSION: Ultrasound and fluoroscopically guided right internal jugular single lumen power port catheter insertion. Tip in the SVC/RA junction. Catheter ready for use. Electronically Signed   By: Jerilynn Mages.  Shick M.D.   On: 08/21/2017 12:56   Ir Fluoro Guide Port Insertion Right  Result Date: 08/21/2017 CLINICAL DATA:  METASTATIC ENDOMETRIAL CARCINOMA EXAM: RIGHT INTERNAL JUGULAR SINGLE LUMEN POWER PORT CATHETER INSERTION Date:  08/21/2017 08/21/2017 12:50 pm Radiologist:  Jerilynn Mages. Daryll Brod, MD Guidance:  Ultrasound and fluoroscopic MEDICATIONS: Ancef 2 g; The antibiotic was administered within an appropriate time interval prior to skin puncture. ANESTHESIA/SEDATION: Versed 2.0 mg IV; Fentanyl 100 mcg IV; Moderate Sedation Time:  27 minutes The patient was continuously monitored during the procedure by the interventional radiology nurse under my direct supervision. FLUOROSCOPY TIME:  One minutes, 6 seconds (5  mGy) COMPLICATIONS: None immediate. CONTRAST:  None. PROCEDURE: Informed consent was obtained from the patient following explanation of the procedure, risks, benefits and alternatives. The patient understands, agrees and consents for the procedure. All questions were addressed. A time out was performed. Maximal barrier sterile technique utilized including caps, mask, sterile gowns, sterile gloves, large sterile drape, hand hygiene, and 2% chlorhexidine scrub. Under sterile conditions and local anesthesia, right internal jugular micropuncture venous access was performed. Access was performed with ultrasound. Images were obtained for documentation. A guide wire was inserted followed by a transitional dilator. This allowed insertion of a guide wire and catheter into the IVC. Measurements were obtained from the SVC / RA junction back to the right IJ venotomy site. In the right infraclavicular chest, a subcutaneous pocket was created over the second anterior rib. This was done under sterile  conditions and local anesthesia. 1% lidocaine with epinephrine was utilized for this. A 2.5 cm incision was made in the skin. Blunt dissection was performed to create a subcutaneous pocket over the right pectoralis major muscle. The pocket was flushed with saline vigorously. There was adequate hemostasis. The port catheter was assembled and checked for leakage. The port catheter was secured in the pocket with two retention sutures. The tubing was tunneled subcutaneously to the right venotomy site and inserted into the SVC/RA junction through a valved peel-away sheath. Position was confirmed with fluoroscopy. Images were obtained for documentation. The patient tolerated the procedure well. No immediate complications. Incisions were closed in a two layer fashion with 4 - 0 Vicryl suture. Dermabond was applied to the skin. The port catheter was accessed, blood was aspirated followed by saline and heparin flushes. Needle was removed. A dry sterile dressing was applied. IMPRESSION: Ultrasound and fluoroscopically guided right internal jugular single lumen power port catheter insertion. Tip in the SVC/RA junction. Catheter ready for use. Electronically Signed   By: Jerilynn Mages.  Shick M.D.   On: 08/21/2017 12:56    All questions were answered. The patient knows to call the clinic with any problems, questions or concerns. No barriers to learning was detected.  I spent 25 minutes counseling the patient face to face. The total time spent in the appointment was 30 minutes and more than 50% was on counseling and review of test results  Heath Lark, MD 09/06/2017 12:37 PM

## 2017-09-06 NOTE — Telephone Encounter (Signed)
Called patient regarding 3/22

## 2017-09-06 NOTE — Assessment & Plan Note (Signed)
She has severe weight loss She is eating well with good appetite I recommend frequent small meals I will consult dietitian to follow when she comes back for treatment

## 2017-09-06 NOTE — Telephone Encounter (Signed)
Gave avs and calendar for march °

## 2017-09-17 ENCOUNTER — Telehealth: Payer: Self-pay

## 2017-09-17 NOTE — Telephone Encounter (Signed)
Spoke with pt by phone.  Pt stated she's having some "allergies"  "probably because of the weather".  Pt asked if OK if she takes claritin since she is allergic to benadryl.  This RN informed pt that it will be OK if she takes claritin.

## 2017-09-20 ENCOUNTER — Inpatient Hospital Stay: Payer: BC Managed Care – PPO

## 2017-09-20 ENCOUNTER — Ambulatory Visit (HOSPITAL_COMMUNITY): Payer: BC Managed Care – PPO | Attending: Hematology and Oncology

## 2017-09-20 ENCOUNTER — Other Ambulatory Visit: Payer: Self-pay | Admitting: Hematology and Oncology

## 2017-09-20 ENCOUNTER — Encounter: Payer: Self-pay | Admitting: Hematology and Oncology

## 2017-09-20 ENCOUNTER — Inpatient Hospital Stay (HOSPITAL_BASED_OUTPATIENT_CLINIC_OR_DEPARTMENT_OTHER): Payer: BC Managed Care – PPO | Admitting: Hematology and Oncology

## 2017-09-20 ENCOUNTER — Inpatient Hospital Stay: Payer: BC Managed Care – PPO | Attending: Hematology and Oncology

## 2017-09-20 ENCOUNTER — Inpatient Hospital Stay: Payer: BC Managed Care – PPO | Admitting: Nutrition

## 2017-09-20 DIAGNOSIS — Z7189 Other specified counseling: Secondary | ICD-10-CM

## 2017-09-20 DIAGNOSIS — D500 Iron deficiency anemia secondary to blood loss (chronic): Secondary | ICD-10-CM

## 2017-09-20 DIAGNOSIS — C787 Secondary malignant neoplasm of liver and intrahepatic bile duct: Secondary | ICD-10-CM | POA: Insufficient documentation

## 2017-09-20 DIAGNOSIS — C530 Malignant neoplasm of endocervix: Secondary | ICD-10-CM | POA: Insufficient documentation

## 2017-09-20 DIAGNOSIS — Z79899 Other long term (current) drug therapy: Secondary | ICD-10-CM | POA: Diagnosis not present

## 2017-09-20 DIAGNOSIS — E440 Moderate protein-calorie malnutrition: Secondary | ICD-10-CM | POA: Insufficient documentation

## 2017-09-20 DIAGNOSIS — C541 Malignant neoplasm of endometrium: Secondary | ICD-10-CM

## 2017-09-20 DIAGNOSIS — Z5111 Encounter for antineoplastic chemotherapy: Secondary | ICD-10-CM | POA: Diagnosis not present

## 2017-09-20 LAB — CBC WITH DIFFERENTIAL (CANCER CENTER ONLY)
Basophils Absolute: 0 10*3/uL (ref 0.0–0.1)
Basophils Relative: 0 %
EOS ABS: 0.1 10*3/uL (ref 0.0–0.5)
Eosinophils Relative: 1 %
HCT: 32.9 % — ABNORMAL LOW (ref 34.8–46.6)
HEMOGLOBIN: 10 g/dL — AB (ref 11.6–15.9)
LYMPHS ABS: 1.3 10*3/uL (ref 0.9–3.3)
Lymphocytes Relative: 19 %
MCH: 25.3 pg (ref 25.1–34.0)
MCHC: 30.4 g/dL — AB (ref 31.5–36.0)
MCV: 83.3 fL (ref 79.5–101.0)
Monocytes Absolute: 0.4 10*3/uL (ref 0.1–0.9)
Monocytes Relative: 5 %
NEUTROS PCT: 75 %
Neutro Abs: 5.3 10*3/uL (ref 1.5–6.5)
Platelet Count: 367 10*3/uL (ref 145–400)
RBC: 3.95 MIL/uL (ref 3.70–5.45)
RDW: 28.7 % — ABNORMAL HIGH (ref 11.2–14.5)
WBC Count: 7 10*3/uL (ref 3.9–10.3)

## 2017-09-20 LAB — CMP (CANCER CENTER ONLY)
ALT: 22 U/L (ref 0–55)
AST: 27 U/L (ref 5–34)
Albumin: 3.4 g/dL — ABNORMAL LOW (ref 3.5–5.0)
Alkaline Phosphatase: 171 U/L — ABNORMAL HIGH (ref 40–150)
Anion gap: 8 (ref 3–11)
BUN: 11 mg/dL (ref 7–26)
CHLORIDE: 106 mmol/L (ref 98–109)
CO2: 26 mmol/L (ref 22–29)
CREATININE: 0.65 mg/dL (ref 0.60–1.10)
Calcium: 9.1 mg/dL (ref 8.4–10.4)
GFR, Estimated: >60 mL/min (ref >60)
Glucose, Bld: 90 mg/dL (ref 70–140)
Potassium: 4 mmol/L (ref 3.5–5.1)
SODIUM: 140 mmol/L (ref 136–145)
Total Bilirubin: 0.4 mg/dL (ref 0.2–1.2)
Total Protein: 7 g/dL (ref 6.4–8.3)

## 2017-09-20 LAB — MAGNESIUM: MAGNESIUM: 2.2 mg/dL (ref 1.5–2.5)

## 2017-09-20 LAB — SAMPLE TO BLOOD BANK

## 2017-09-20 LAB — TOTAL PROTEIN, URINE DIPSTICK: Protein, ur: NEGATIVE mg/dL

## 2017-09-20 MED ORDER — PALONOSETRON HCL INJECTION 0.25 MG/5ML
0.2500 mg | Freq: Once | INTRAVENOUS | Status: AC
  Administered 2017-09-20: 0.25 mg via INTRAVENOUS

## 2017-09-20 MED ORDER — SODIUM CHLORIDE 0.9 % IV SOLN
Freq: Once | INTRAVENOUS | Status: AC
  Administered 2017-09-20: 10:00:00 via INTRAVENOUS

## 2017-09-20 MED ORDER — POTASSIUM CHLORIDE 2 MEQ/ML IV SOLN
Freq: Once | INTRAVENOUS | Status: AC
  Administered 2017-09-20: 10:00:00 via INTRAVENOUS
  Filled 2017-09-20: qty 10

## 2017-09-20 MED ORDER — SODIUM CHLORIDE 0.9% FLUSH
10.0000 mL | INTRAVENOUS | Status: DC | PRN
  Administered 2017-09-20: 10 mL
  Filled 2017-09-20: qty 10

## 2017-09-20 MED ORDER — FAMOTIDINE IN NACL 20-0.9 MG/50ML-% IV SOLN: 20.0000 mg | Freq: Once | INTRAVENOUS | Status: DC

## 2017-09-20 MED ORDER — SODIUM CHLORIDE 0.9 % IV SOLN
Freq: Once | INTRAVENOUS | Status: AC
  Administered 2017-09-20: 17:00:00 via INTRAVENOUS

## 2017-09-20 MED ORDER — CISPLATIN CHEMO INJECTION 100MG/100ML
50.0000 mg/m2 | Freq: Once | INTRAVENOUS | Status: AC
  Administered 2017-09-20: 78 mg via INTRAVENOUS
  Filled 2017-09-20: qty 78

## 2017-09-20 MED ORDER — PALONOSETRON HCL INJECTION 0.25 MG/5ML
INTRAVENOUS | Status: AC
  Filled 2017-09-20: qty 5

## 2017-09-20 MED ORDER — SODIUM CHLORIDE 0.9 % IV SOLN
20.0000 mg | Freq: Once | INTRAVENOUS | Status: AC
  Administered 2017-09-20: 20 mg via INTRAVENOUS
  Filled 2017-09-20: qty 2

## 2017-09-20 MED ORDER — MIRTAZAPINE 30 MG PO TABS: 30.0000 mg | ORAL_TABLET | Freq: Every day | ORAL | 11 refills | Status: DC

## 2017-09-20 MED ORDER — SODIUM CHLORIDE 0.9 % IV SOLN
15.0000 mg/kg | Freq: Once | INTRAVENOUS | Status: AC
  Administered 2017-09-20: 800 mg via INTRAVENOUS
  Filled 2017-09-20: qty 32

## 2017-09-20 MED ORDER — SODIUM CHLORIDE 0.9 % IV SOLN
Freq: Once | INTRAVENOUS | Status: AC
  Administered 2017-09-20: 13:00:00 via INTRAVENOUS
  Filled 2017-09-20: qty 5

## 2017-09-20 MED ORDER — SODIUM CHLORIDE 0.9 % IV SOLN
135.0000 mg/m2 | Freq: Once | INTRAVENOUS | Status: AC
  Administered 2017-09-20: 210 mg via INTRAVENOUS
  Filled 2017-09-20: qty 35

## 2017-09-20 MED ORDER — HEPARIN SOD (PORK) LOCK FLUSH 100 UNIT/ML IV SOLN
500.0000 [IU] | Freq: Once | INTRAVENOUS | Status: AC | PRN
  Administered 2017-09-20: 500 [IU]
  Filled 2017-09-20: qty 5

## 2017-09-20 MED ORDER — SODIUM CHLORIDE 0.9 % IV SOLN: Freq: Once | INTRAVENOUS | Status: DC

## 2017-09-20 NOTE — Progress Notes (Signed)
47 year old female diagnosed with uterine cancer.  She is a patient of Dr. Alvy Bimler.  Past medical history includes anemia.  Medications include Remeron, Zofran  Labs include sodium 133 and albumin 2.7.  Height: 64 inches. Weight: 122.6 pounds. Usual body weight: 130 pounds per patient. BMI: 21.04.  Patient has gained approximately 10 pounds over the past 2 weeks. She reports this is because she is eating smaller amounts more often. She has also started to drink boost oral nutrition supplements. She is managing her constipation. She denies other nutrition impact symptoms.  Nutrition diagnosis: Food and nutrition related knowledge deficit related to uterine cancer as evidenced by no prior need for nutrition related information.  Intervention: Patient was educated to continue the same strategies for small frequent meals and snacks. Educated patient on high-calorie high-protein food as tolerated. Reviewed strategies for improved constipation. Provided patient with coupons for all nutrition supplements. Questions were answered.  Teach back method used.  Contact information was given.  Monitoring evaluation goals: Patient will tolerate sufficient calories and protein for weight maintenance.  Next visit: To be scheduled if needed.  **Disclaimer: This note was dictated with voice recognition software. Similar sounding words can inadvertently be transcribed and this note may contain transcription errors which may not have been corrected upon publication of note.**

## 2017-09-20 NOTE — Patient Instructions (Addendum)
[facility] Discharge Instructions for Patients Receiving Chemotherapy  Today you received the following chemotherapy agents: Avastin, Taxol and Cisplatin.  To help prevent nausea and vomiting after your treatment, we encourage you to take your nausea medication as directed   If you develop nausea and vomiting that is not controlled by your nausea medication, call the clinic.   BELOW ARE SYMPTOMS THAT SHOULD BE REPORTED IMMEDIATELY:  *FEVER GREATER THAN 100.5 F  *CHILLS WITH OR WITHOUT FEVER  NAUSEA AND VOMITING THAT IS NOT CONTROLLED WITH YOUR NAUSEA MEDICATION  *UNUSUAL SHORTNESS OF BREATH  *UNUSUAL BRUISING OR BLEEDING  TENDERNESS IN MOUTH AND THROAT WITH OR WITHOUT PRESENCE OF ULCERS  *URINARY PROBLEMS  *BOWEL PROBLEMS  UNUSUAL RASH Items with * indicate a potential emergency and should be followed up as soon as possible.  Feel free to call the clinic should you have any questions or concerns. The clinic phone number is [phone].  Please show the [facility] at check-in to the Emergency Department and triage nurse.  Bevacizumab (Avastin) injection What is this medicine? BEVACIZUMAB (be va SIZ yoo mab) is a monoclonal antibody. It is used to treat many types of cancer. This medicine may be used for other purposes; ask your health care provider or pharmacist if you have questions. COMMON BRAND NAME(S): Avastin What should I tell my health care provider before I take this medicine? They need to know if you have any of these conditions: -diabetes -heart disease -high blood pressure -history of coughing up blood -prior anthracycline chemotherapy (e.g., doxorubicin, daunorubicin, epirubicin) -recent or ongoing radiation therapy -recent or planning to have surgery -stroke -an unusual or allergic reaction to bevacizumab, hamster proteins, mouse proteins, other medicines, foods, dyes, or preservatives -pregnant or trying to get  pregnant -breast-feeding How should I use this medicine? This medicine is for infusion into a vein. It is given by a health care professional in a hospital or clinic setting. Talk to your pediatrician regarding the use of this medicine in children. Special care may be needed. Overdosage: If you think you have taken too much of this medicine contact a poison control center or emergency room at once. NOTE: This medicine is only for you. Do not share this medicine with others. What if I miss a dose? It is important not to miss your dose. Call your doctor or health care professional if you are unable to keep an appointment. What may interact with this medicine? Interactions are not expected. This list may not describe all possible interactions. Give your health care provider a list of all the medicines, herbs, non-prescription drugs, or dietary supplements you use. Also tell them if you smoke, drink alcohol, or use illegal drugs. Some items may interact with your medicine. What should I watch for while using this medicine? Your condition will be monitored carefully while you are receiving this medicine. You will need important blood work and urine testing done while you are taking this medicine. This medicine may increase your risk to bruise or bleed. Call your doctor or health care professional if you notice any unusual bleeding. This medicine should be started at least 28 days following major surgery and the site of the surgery should be totally healed. Check with your doctor before scheduling dental work or surgery while you are receiving this treatment. Talk to your doctor if you have recently had surgery or if you have a wound that has not healed. Do not become pregnant while taking this medicine  or for 6 months after stopping it. Women should inform their doctor if they wish to become pregnant or think they might be pregnant. There is a potential for serious side effects to an unborn child. Talk to  your health care professional or pharmacist for more information. Do not breast-feed an infant while taking this medicine and for 6 months after the last dose. This medicine has caused ovarian failure in some women. This medicine may interfere with the ability to have a child. You should talk to your doctor or health care professional if you are concerned about your fertility. What side effects may I notice from receiving this medicine? Side effects that you should report to your doctor or health care professional as soon as possible: -allergic reactions like skin rash, itching or hives, swelling of the face, lips, or tongue -chest pain or chest tightness -chills -coughing up blood -high fever -seizures -severe constipation -signs and symptoms of bleeding such as bloody or black, tarry stools; red or dark-brown urine; spitting up blood or brown material that looks like coffee grounds; red spots on the skin; unusual bruising or bleeding from the eye, gums, or nose -signs and symptoms of a blood clot such as breathing problems; chest pain; severe, sudden headache; pain, swelling, warmth in the leg -signs and symptoms of a stroke like changes in vision; confusion; trouble speaking or understanding; severe headaches; sudden numbness or weakness of the face, arm or leg; trouble walking; dizziness; loss of balance or coordination -stomach pain -sweating -swelling of legs or ankles -vomiting -weight gain Side effects that usually do not require medical attention (report to your doctor or health care professional if they continue or are bothersome): -back pain -changes in taste -decreased appetite -dry skin -nausea -tiredness This list may not describe all possible side effects. Call your doctor for medical advice about side effects. You may report side effects to FDA at 1-800-FDA-1088. Where should I keep my medicine? This drug is given in a hospital or clinic and will not be stored at  home. NOTE: This sheet is a summary. It may not cover all possible information. If you have questions about this medicine, talk to your doctor, pharmacist, or health care provider.  2018 Elsevier/Gold Standard (2016-07-06 14:33:29)

## 2017-09-20 NOTE — Assessment & Plan Note (Signed)
Clinically, she appears to be responding very well to treatment with resolution of vaginal bleeding and weight gain We will proceed with treatment without dose adjustment Since her bleeding has stopped, I will add Avastin today I plan to give her minimum 3 cycles of chemotherapy before repeat CT imaging

## 2017-09-20 NOTE — Progress Notes (Signed)
[facility] OFFICE PROGRESS NOTE  Patient Care Team: Avel Sensor, MD as PCP - General (Obstetrics and Gynecology)  ASSESSMENT & PLAN:  Cervical cancer [facility]) Clinically, she appears to be responding very well to treatment with resolution of vaginal bleeding and weight gain We will proceed with treatment without dose adjustment Since her bleeding has stopped, I will add Avastin today I plan to give her minimum 3 cycles of chemotherapy before repeat CT imaging  Iron deficiency anemia due to chronic blood loss She has responded well to intravenous iron infusion I will observe for now I plan to give her more intravenous iron in the next visit  Protein-calorie malnutrition, moderate ([place]) She is doing well and gaining weight She will continue close follow-up with dietitian I recommend we continue on Remeron   No orders of the defined types were placed in this encounter.   INTERVAL HISTORY: Please see below for problem oriented charting. She returns for further follow-up She is doing well She has gained 10 pounds of weight since the last time I saw her Vaginal bleeding has stopped She has good energy and appetite She denies nausea, vomiting, constipation or peripheral neuropathy from treatment  SUMMARY OF ONCOLOGIC HISTORY:   Cervical cancer ([place])   07/22/2017 Initial Diagnosis    The patient was examined in the wellness center.  Upon vaginal exam, friable mass of tissue presumably from the cervix was interpreted as poorly differentiated high-grade malignant neoplasm.  Follow-up workup with ultrasound of the uterus revealed multiple large uterine fibroids.      07/22/2017 Pathology Results    Biopsy confirmed poorly differentiated high-grade malignant neoplasm.      08/06/2017 Imaging    6.6 cm uterine mass in area of cervix and upper vagina, highly suspicious for cervical carcinoma.  Mild bilateral iliac lymphadenopathy, consistent with metastatic  disease. No abdominal lymphadenopathy identified.  Multiple large liver masses, with areas of central necrosis or scarring, highly suspicious for liver metastases. Although unlikely, it is conceivable that some of these lesions could represent other etiology such as focal nodular hyperplasia or adenomas. Consider further evaluation with PET-CT, abdomen MRI without and with contrast, or percutaneous needle biopsy.  Multiple large uterine fibroids, many showing cystic degeneration.  Bilateral cystic adnexal lesions with probably benign features. Differential diagnosis includes ovarian cysts, endometriomas, and hydrosalpinges. These could also be further assessed on PET-CT or MRI.  No evidence of thoracic metastatic disease.       08/09/2017 Tumor Marker    Patient's tumor was tested for the following markers: CA-125 Results of the tumor marker test revealed 79.2      08/14/2017 Pathology Results    Liver, needle/core biopsy, left hepatic lobe - METASTATIC POORLY DIFFERENTIATED CARCINOMA, SEE COMMENT. Microscopic Comment The tumor is poorly differentiated with abundant necrosis. Pancytokeratin, PAX-8, cytokeratin 7, and p16 are positive. There is weak non-specific staining with TTF-1, synaptophysin (very focal), p63 (very focal). Chromogranin, CD56, S100, desmin, SMA, ER, CD10, cytokeratin 10, cytokeratin 5/6, and cytokeratin 20 are negative. Thus, the immunoprofile is suggest of a gynecologic primary. Dr. Lyndon Code has reviewed the case.       08/21/2017 Procedure    Ultrasound and fluoroscopically guided right internal jugular single lumen power port catheter insertion. Tip in the SVC/RA junction. Catheter ready for use.      08/30/2017 -  Chemotherapy    The patient had received cisplatin and Taxol       REVIEW OF SYSTEMS:   Constitutional: Denies fevers,  chills or abnormal weight loss Eyes: Denies blurriness of vision Ears, nose, mouth, throat, and face: Denies mucositis or sore  throat Respiratory: Denies cough, dyspnea or wheezes Cardiovascular: Denies palpitation, chest discomfort or lower extremity swelling Gastrointestinal:  Denies nausea, heartburn or change in bowel habits Skin: Denies abnormal skin rashes Lymphatics: Denies new lymphadenopathy or easy bruising Neurological:Denies numbness, tingling or new weaknesses Behavioral/Psych: Mood is stable, no new changes  All other systems were reviewed with the patient and are negative.  I have reviewed the past medical history, past surgical history, social history and family history with the patient and they are unchanged from previous note.  ALLERGIES:  is allergic to benadryl [diphenhydramine].  MEDICATIONS:  Current Outpatient Medications  Medication Sig Dispense Refill  . acetaminophen (TYLENOL) 325 MG tablet Take 650 mg by mouth every 6 (six) hours as needed.    . lidocaine-prilocaine (EMLA) cream Apply to affected area once 30 g 3  . mirtazapine (REMERON) 30 MG tablet Take 1 tablet (30 mg total) by mouth at bedtime. 30 tablet 11  . ondansetron (ZOFRAN) 8 MG tablet Take 1 tablet (8 mg total) by mouth 2 (two) times daily as needed. Start on the third day after chemotherapy. 30 tablet 1  . prochlorperazine (COMPAZINE) 10 MG tablet Take 1 tablet (10 mg total) by mouth every 6 (six) hours as needed for nausea or vomiting. 30 tablet 0   No current facility-administered medications for this visit.    Facility-Administered Medications Ordered in Other Visits  Medication Dose Route Frequency Provider Last Rate Last Dose  . heparin lock flush 100 unit/mL  500 Units Intracatheter Once PRN Alvy Bimler, Britton Perkinson, MD      . sodium chloride flush (NS) 0.9 % injection 10 mL  10 mL Intracatheter PRN Alvy Bimler, Ari Bernabei, MD        PHYSICAL EXAMINATION: ECOG PERFORMANCE STATUS: 1 - Symptomatic but completely ambulatory  Vitals:   09/20/17 0909  BP: 108/67  Pulse: 83  Resp: 18  Temp: 98.5 F (36.9 C)  SpO2: 100%   Filed  Weights   09/20/17 0909  Weight: 122 lb 9.6 oz (55.6 kg)    GENERAL:alert, no distress and comfortable SKIN: skin color, texture, turgor are normal, no rashes or significant lesions EYES: normal, Conjunctiva are pink and non-injected, sclera clear OROPHARYNX:no exudate, no erythema and lips, buccal mucosa, and tongue normal  NECK: supple, thyroid normal size, non-tender, without nodularity LYMPH:  no palpable lymphadenopathy in the cervical, axillary or inguinal LUNGS: clear to auscultation and percussion with normal breathing effort HEART: regular rate & rhythm and no murmurs and no lower extremity edema ABDOMEN:abdomen soft, persistent palpable mass but less significant compared to prior visit Musculoskeletal:no cyanosis of digits and no clubbing  NEURO: alert & oriented x 3 with fluent speech, no focal motor/sensory deficits  LABORATORY DATA:  I have reviewed the data as listed    Component Value Date/Time   NA 140 09/20/2017 0950   K 4.0 09/20/2017 0950   CL 106 09/20/2017 0950   CO2 26 09/20/2017 0950   GLUCOSE 90 09/20/2017 0950   BUN 11 09/20/2017 0950   CREATININE 0.65 09/20/2017 0950   CALCIUM 9.1 09/20/2017 0950   PROT 7.0 09/20/2017 0950   ALBUMIN 3.4 (L) 09/20/2017 0950   AST 27 09/20/2017 0950   ALT 22 09/20/2017 0950   ALKPHOS 171 (H) 09/20/2017 0950   BILITOT 0.4 09/20/2017 0950   GFRNONAA >60 09/20/2017 0950   GFRAA >60 09/20/2017 0950  No results found for: SPEP, UPEP  Lab Results  Component Value Date   WBC 7.0 09/20/2017   NEUTROABS 5.3 09/20/2017   HGB 8.1 (L) 08/21/2017   HCT 32.9 (L) 09/20/2017   MCV 83.3 09/20/2017   PLT 367 09/20/2017      Chemistry      Component Value Date/Time   NA 140 09/20/2017 0950   K 4.0 09/20/2017 0950   CL 106 09/20/2017 0950   CO2 26 09/20/2017 0950   BUN 11 09/20/2017 0950   CREATININE 0.65 09/20/2017 0950      Component Value Date/Time   CALCIUM 9.1 09/20/2017 0950   ALKPHOS 171 (H) 09/20/2017 0950    AST 27 09/20/2017 0950   ALT 22 09/20/2017 0950   BILITOT 0.4 09/20/2017 0950       RADIOGRAPHIC STUDIES: I have personally reviewed the radiological images as listed and agreed with the findings in the report. Ir US Guide Vasc Access Right  Result Date: 08/21/2017 CLINICAL DATA:  METASTATIC ENDOMETRIAL CARCINOMA EXAM: RIGHT INTERNAL JUGULAR SINGLE LUMEN POWER PORT CATHETER INSERTION Date:  08/21/2017 08/21/2017 12:50 pm Radiologist:  Jerilynn Mages. Daryll Brod, MD Guidance:  Ultrasound and fluoroscopic MEDICATIONS: Ancef 2 g; The antibiotic was administered within an appropriate time interval prior to skin puncture. ANESTHESIA/SEDATION: Versed 2.0 mg IV; Fentanyl 100 mcg IV; Moderate Sedation Time:  27 minutes The patient was continuously monitored during the procedure by the interventional radiology nurse under my direct supervision. FLUOROSCOPY TIME:  One minutes, 6 seconds (5 mGy) COMPLICATIONS: None immediate. CONTRAST:  None. PROCEDURE: Informed consent was obtained from the patient following explanation of the procedure, risks, benefits and alternatives. The patient understands, agrees and consents for the procedure. All questions were addressed. A time out was performed. Maximal barrier sterile technique utilized including caps, mask, sterile gowns, sterile gloves, large sterile drape, hand hygiene, and 2% chlorhexidine scrub. Under sterile conditions and local anesthesia, right internal jugular micropuncture venous access was performed. Access was performed with ultrasound. Images were obtained for documentation. A guide wire was inserted followed by a transitional dilator. This allowed insertion of a guide wire and catheter into the IVC. Measurements were obtained from the SVC / RA junction back to the right IJ venotomy site. In the right infraclavicular chest, a subcutaneous pocket was created over the second anterior rib. This was done under sterile conditions and local anesthesia. 1% lidocaine with  epinephrine was utilized for this. A 2.5 cm incision was made in the skin. Blunt dissection was performed to create a subcutaneous pocket over the right pectoralis major muscle. The pocket was flushed with saline vigorously. There was adequate hemostasis. The port catheter was assembled and checked for leakage. The port catheter was secured in the pocket with two retention sutures. The tubing was tunneled subcutaneously to the right venotomy site and inserted into the SVC/RA junction through a valved peel-away sheath. Position was confirmed with fluoroscopy. Images were obtained for documentation. The patient tolerated the procedure well. No immediate complications. Incisions were closed in a two layer fashion with 4 - 0 Vicryl suture. Dermabond was applied to the skin. The port catheter was accessed, blood was aspirated followed by saline and heparin flushes. Needle was removed. A dry sterile dressing was applied. IMPRESSION: Ultrasound and fluoroscopically guided right internal jugular single lumen power port catheter insertion. Tip in the SVC/RA junction. Catheter ready for use. Electronically Signed   By: Jerilynn Mages.  Shick M.D.   On: 08/21/2017 12:56   [place]  Insertion Right  Result Date: 08/21/2017 CLINICAL DATA:  METASTATIC ENDOMETRIAL CARCINOMA EXAM: RIGHT INTERNAL JUGULAR SINGLE LUMEN POWER PORT CATHETER INSERTION Date:  08/21/2017 08/21/2017 12:50 pm Radiologist:  Jerilynn Mages. Daryll Brod, MD Guidance:  Ultrasound and fluoroscopic MEDICATIONS: Ancef 2 g; The antibiotic was administered within an appropriate time interval prior to skin puncture. ANESTHESIA/SEDATION: Versed 2.0 mg IV; Fentanyl 100 mcg IV; Moderate Sedation Time:  27 minutes The patient was continuously monitored during the procedure by the interventional radiology nurse under my direct supervision. FLUOROSCOPY TIME:  One minutes, 6 seconds (5 mGy) COMPLICATIONS: None immediate. CONTRAST:  None. PROCEDURE: Informed consent was obtained from the  patient following explanation of the procedure, risks, benefits and alternatives. The patient understands, agrees and consents for the procedure. All questions were addressed. A time out was performed. Maximal barrier sterile technique utilized including caps, mask, sterile gowns, sterile gloves, large sterile drape, hand hygiene, and 2% chlorhexidine scrub. Under sterile conditions and local anesthesia, right internal jugular micropuncture venous access was performed. Access was performed with ultrasound. Images were obtained for documentation. A guide wire was inserted followed by a transitional dilator. This allowed insertion of a guide wire and catheter into the IVC. Measurements were obtained from the SVC / RA junction back to the right IJ venotomy site. In the right infraclavicular chest, a subcutaneous pocket was created over the second anterior rib. This was done under sterile conditions and local anesthesia. 1% lidocaine with epinephrine was utilized for this. A 2.5 cm incision was made in the skin. Blunt dissection was performed to create a subcutaneous pocket over the right pectoralis major muscle. The pocket was flushed with saline vigorously. There was adequate hemostasis. The port catheter was assembled and checked for leakage. The port catheter was secured in the pocket with two retention sutures. The tubing was tunneled subcutaneously to the right venotomy site and inserted into the SVC/RA junction through a valved peel-away sheath. Position was confirmed with fluoroscopy. Images were obtained for documentation. The patient tolerated the procedure well. No immediate complications. Incisions were closed in a two layer fashion with 4 - 0 Vicryl suture. Dermabond was applied to the skin. The port catheter was accessed, blood was aspirated followed by saline and heparin flushes. Needle was removed. A dry sterile dressing was applied. IMPRESSION: Ultrasound and fluoroscopically guided right internal  jugular single lumen power port catheter insertion. Tip in the SVC/RA junction. Catheter ready for use. Electronically Signed   By: Jerilynn Mages.  Shick M.D.   On: 08/21/2017 12:56    All questions were answered. The patient knows to call the clinic with any problems, questions or concerns. No barriers to learning was detected.  I spent 15 minutes counseling the patient face to face. The total time spent in the appointment was 20 minutes and more than 50% was on counseling and review of test results  Heath Lark, MD 09/20/2017 11:07 AM

## 2017-09-20 NOTE — Assessment & Plan Note (Signed)
She is doing well and gaining weight She will continue close follow-up with dietitian I recommend we continue on Remeron

## 2017-09-20 NOTE — Progress Notes (Signed)
Per Dr. Alvy Bimler, due to length of infusion, today the patient will receive 1L NS through PIV during her cisplatin infusion instead of 2h of post cisplatin hydration fluids as stated in her treatment plan.

## 2017-09-20 NOTE — Assessment & Plan Note (Signed)
She has responded well to intravenous iron infusion I will observe for now I plan to give her more intravenous iron in the next visit

## 2017-09-23 NOTE — Progress Notes (Signed)
Chemo f/u call.  See flowsheet 

## 2017-09-26 ENCOUNTER — Telehealth: Payer: Self-pay

## 2017-09-26 NOTE — Telephone Encounter (Signed)
Patient called and left a message to call her.  Called back. She is the ER at [facility] in [place]. She is having chest discomfort and shortness of breath. Instructed to call if needed and to call office with a update when she is able.

## 2017-09-27 ENCOUNTER — Telehealth: Payer: Self-pay

## 2017-09-27 NOTE — Telephone Encounter (Signed)
Called with below message. Verbalized understanding. 

## 2017-09-27 NOTE — Telephone Encounter (Signed)
It is OK for her to take both She can try to take the lorazepam and see how it makes her feel

## 2017-09-27 NOTE — Telephone Encounter (Signed)
Patient called and left a message to call her.  Called back. She went to ER yesterday and now is at home, called to give update. CT scan was negative. MD thinks that it was anxiety, she got medication in her IV yesterday and feels better today. She got a Rx for Ativan .05 mg to take at bedtime for 6 tablets from ER MD, she did not get it filled. She said she already takes the Remeron at bedtime. Should she take the Ativan during the day instead of at bedtime? Does she need another Rx called to her pharmacy?

## 2017-10-04 ENCOUNTER — Inpatient Hospital Stay: Payer: BC Managed Care – PPO

## 2017-10-04 ENCOUNTER — Inpatient Hospital Stay (HOSPITAL_BASED_OUTPATIENT_CLINIC_OR_DEPARTMENT_OTHER): Payer: BC Managed Care – PPO | Admitting: Hematology and Oncology

## 2017-10-04 ENCOUNTER — Telehealth: Payer: Self-pay

## 2017-10-04 ENCOUNTER — Telehealth: Payer: Self-pay | Admitting: Hematology and Oncology

## 2017-10-04 ENCOUNTER — Encounter: Payer: Self-pay | Admitting: Hematology and Oncology

## 2017-10-04 VITALS — BP 113/72 | HR 70 | Temp 97.7°F | Resp 18 | Ht 64.0 in | Wt 124.7 lb

## 2017-10-04 DIAGNOSIS — C541 Malignant neoplasm of endometrium: Secondary | ICD-10-CM

## 2017-10-04 DIAGNOSIS — C530 Malignant neoplasm of endocervix: Secondary | ICD-10-CM | POA: Diagnosis not present

## 2017-10-04 DIAGNOSIS — C787 Secondary malignant neoplasm of liver and intrahepatic bile duct: Secondary | ICD-10-CM

## 2017-10-04 DIAGNOSIS — T451X5A Adverse effect of antineoplastic and immunosuppressive drugs, initial encounter: Secondary | ICD-10-CM

## 2017-10-04 DIAGNOSIS — D500 Iron deficiency anemia secondary to blood loss (chronic): Secondary | ICD-10-CM | POA: Diagnosis not present

## 2017-10-04 DIAGNOSIS — D701 Agranulocytosis secondary to cancer chemotherapy: Secondary | ICD-10-CM | POA: Diagnosis not present

## 2017-10-04 DIAGNOSIS — Z7189 Other specified counseling: Secondary | ICD-10-CM

## 2017-10-04 LAB — CMP (CANCER CENTER ONLY)
ALT: 17 U/L (ref 0–55)
ANION GAP: 5 (ref 3–11)
AST: 21 U/L (ref 5–34)
Albumin: 3.5 g/dL (ref 3.5–5.0)
Alkaline Phosphatase: 146 U/L (ref 40–150)
BILIRUBIN TOTAL: 0.5 mg/dL (ref 0.2–1.2)
BUN: 13 mg/dL (ref 7–26)
CO2: 29 mmol/L (ref 22–29)
Calcium: 9.2 mg/dL (ref 8.4–10.4)
Chloride: 108 mmol/L (ref 98–109)
Creatinine: 0.72 mg/dL (ref 0.60–1.10)
GFR, Est AFR Am: >60 mL/min (ref >60)
Glucose, Bld: 80 mg/dL (ref 70–140)
POTASSIUM: 3.7 mmol/L (ref 3.5–5.1)
Sodium: 142 mmol/L (ref 136–145)
TOTAL PROTEIN: 6.9 g/dL (ref 6.4–8.3)

## 2017-10-04 LAB — CBC WITH DIFFERENTIAL (CANCER CENTER ONLY)
BASOS ABS: 0 10*3/uL (ref 0.0–0.1)
Basophils Relative: 1 %
EOS PCT: 1 %
Eosinophils Absolute: 0 10*3/uL (ref 0.0–0.5)
HCT: 32.8 % — ABNORMAL LOW (ref 34.8–46.6)
Hemoglobin: 10.3 g/dL — ABNORMAL LOW (ref 11.6–15.9)
LYMPHS PCT: 26 %
Lymphs Abs: 0.9 10*3/uL (ref 0.9–3.3)
MCH: 26.8 pg (ref 25.1–34.0)
MCHC: 31.4 g/dL — ABNORMAL LOW (ref 31.5–36.0)
MCV: 85.4 fL (ref 79.5–101.0)
Monocytes Absolute: 0.3 10*3/uL (ref 0.1–0.9)
Monocytes Relative: 9 %
NEUTROS ABS: 2.3 10*3/uL (ref 1.5–6.5)
Neutrophils Relative %: 63 %
PLATELETS: 145 10*3/uL (ref 145–400)
RBC: 3.84 MIL/uL (ref 3.70–5.45)
RDW: 28 % — ABNORMAL HIGH (ref 11.2–14.5)
WBC: 3.7 10*3/uL — AB (ref 3.9–10.3)

## 2017-10-04 LAB — MAGNESIUM: Magnesium: 2.1 mg/dL (ref 1.5–2.5)

## 2017-10-04 LAB — SAMPLE TO BLOOD BANK

## 2017-10-04 LAB — TOTAL PROTEIN, URINE DIPSTICK

## 2017-10-04 MED ORDER — SODIUM CHLORIDE 0.9% FLUSH
10.0000 mL | Freq: Once | INTRAVENOUS | Status: AC
  Administered 2017-10-04: 10 mL
  Filled 2017-10-04: qty 10

## 2017-10-04 MED ORDER — ALPRAZOLAM 0.5 MG PO TABS: 5.0000 mg | ORAL_TABLET | Freq: Every evening | ORAL | 0 refills | Status: DC | PRN

## 2017-10-04 NOTE — Progress Notes (Signed)
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[facility]) The size of the tumor in the liver is reduced Her liver function is normal CT scan as discussed next month  Iron deficiency anemia due to chronic blood loss The patient had require iron infusion and blood transfusion recently due to chronic blood loss from vaginal bleeding Since chemotherapy, her bleeding has stopped She is still mildly anemic but asymptomatic We discussed the risk and benefit of further intravenous iron infusion but the patient feels well enough and she does not feel she needed more iron infusion We discussed iron rich food substances  Leukopenia due to antineoplastic chemotherapy [facility]) She has mild leukopenia from recent treatment She is not symptomatic Observe only   Orders Placed This Encounter  Procedures  . CT ABDOMEN PELVIS W CONTRAST    Standing Status:   Future    Standing Expiration Date:   10/05/2018    Order Specific Question:   If indicated for the ordered procedure, I authorize the administration of contrast media per Radiology protocol    Answer:   Yes    Order Specific Question:   Preferred imaging location?    Answer:   [facility]    Order Specific Question:   Radiology Contrast Protocol - do NOT remove file path    Answer:   [email or web address]    Order Specific Question:   Is patient pregnant?    Answer:   No  . Total Protein, Urine dipstick    Standing Status:   Standing    Number of Occurrences:   9    Standing Expiration Date:   10/05/2018     INTERVAL HISTORY: Please see below for problem oriented charting. She returns with her sister for further follow-up She was recently seen in the emergency department due to symptoms of shortness of breath CT angiogram was negative for blood clot She was ultimately discharged and she thought it could be due to anxiety She was prescribed alprazolam that appears to be very helpful for her She denies nausea, vomiting or severe constipation or changes in bowel habits from recent treatment She is eating well and gaining weight She has no further vaginal bleeding The palpable mass in her abdomen appears to be shrinking She has no peripheral neuropathy from treatment She denies recent infection  SUMMARY OF ONCOLOGIC HISTORY:   Cervical cancer ([place])   07/22/2017 Initial Diagnosis    The patient was examined in the wellness center.  Upon vaginal exam, friable mass of tissue presumably from the cervix was interpreted as poorly differentiated high-grade malignant neoplasm.  Follow-up workup with ultrasound of the uterus revealed multiple large uterine fibroids.      07/22/2017 Pathology Results    Biopsy confirmed poorly differentiated high-grade malignant neoplasm.      08/06/2017 Imaging    6.6 cm uterine mass in area of cervix and upper vagina, highly suspicious for cervical carcinoma.  Mild bilateral iliac lymphadenopathy, consistent with metastatic disease. No abdominal lymphadenopathy identified.  Multiple large liver masses, with areas of central necrosis or scarring, highly suspicious for liver  metastases. Although unlikely, it is conceivable that some of these lesions could represent other etiology such as focal nodular hyperplasia or adenomas. Consider further evaluation with PET-CT, abdomen MRI without and with contrast, or percutaneous needle biopsy.  Multiple large uterine fibroids, many showing cystic degeneration.  Bilateral cystic adnexal lesions with probably benign  features. Differential diagnosis includes ovarian cysts, endometriomas, and hydrosalpinges. These could also be further assessed on PET-CT or MRI.  No evidence of thoracic metastatic disease.       08/09/2017 Tumor Marker    Patient's tumor was tested for the following markers: CA-125 Results of the tumor marker test revealed 79.2      08/14/2017 Pathology Results    Liver, needle/core biopsy, left hepatic lobe - METASTATIC POORLY DIFFERENTIATED CARCINOMA, SEE COMMENT. Microscopic Comment The tumor is poorly differentiated with abundant necrosis. Pancytokeratin, PAX-8, cytokeratin 7, and p16 are positive. There is weak non-specific staining with TTF-1, synaptophysin (very focal), p63 (very focal). Chromogranin, CD56, S100, desmin, SMA, ER, CD10, cytokeratin 10, cytokeratin 5/6, and cytokeratin 20 are negative. Thus, the immunoprofile is suggest of a gynecologic primary. Dr. Lyndon Code has reviewed the case.       08/21/2017 Procedure    Ultrasound and fluoroscopically guided right internal jugular single lumen power port catheter insertion. Tip in the SVC/RA junction. Catheter ready for use.      08/30/2017 -  Chemotherapy    The patient had received cisplatin and Taxol       REVIEW OF SYSTEMS:   Constitutional: Denies fevers, chills or abnormal weight loss Eyes: Denies blurriness of vision Ears, nose, mouth, throat, and face: Denies mucositis or sore throat Respiratory: Denies cough, dyspnea or wheezes Cardiovascular: Denies palpitation, chest discomfort or lower extremity swelling Gastrointestinal:  Denies nausea, heartburn or change in bowel habits Skin: Denies abnormal skin rashes Lymphatics: Denies new lymphadenopathy or easy bruising Neurological:Denies numbness, tingling or new weaknesses Behavioral/Psych: Mood is stable, no new changes  All other systems were reviewed with the patient and are negative.  I have reviewed the past medical history, past surgical history, social  history and family history with the patient and they are unchanged from previous note.  ALLERGIES:  is allergic to benadryl [diphenhydramine].  MEDICATIONS:  Current Outpatient Medications  Medication Sig Dispense Refill  . acetaminophen (TYLENOL) 325 MG tablet Take 650 mg by mouth every 6 (six) hours as needed.    . ALPRAZolam (XANAX) 0.5 MG tablet Take 10 tablets (5 mg total) by mouth at bedtime as needed for anxiety. 60 tablet 0  . lidocaine-prilocaine (EMLA) cream Apply to affected area once 30 g 3  . mirtazapine (REMERON) 30 MG tablet Take 1 tablet (30 mg total) by mouth at bedtime. 30 tablet 11  . ondansetron (ZOFRAN) 8 MG tablet Take 1 tablet (8 mg total) by mouth 2 (two) times daily as needed. Start on the third day after chemotherapy. 30 tablet 1  . prochlorperazine (COMPAZINE) 10 MG tablet Take 1 tablet (10 mg total) by mouth every 6 (six) hours as needed for nausea or vomiting. 30 tablet 0   No current facility-administered medications for this visit.     PHYSICAL EXAMINATION: ECOG PERFORMANCE STATUS: 1 - Symptomatic but completely ambulatory  Vitals:   10/04/17 1241  BP: 113/72  Pulse: 70  Resp: 18  Temp: 97.7 F (36.5 C)  SpO2: 100%   Filed Weights   10/04/17 1241  Weight: 124 lb 11.2 oz (56.6 kg)    GENERAL:alert, no distress and comfortable SKIN:  skin color, texture, turgor are normal, no rashes or significant lesions EYES: normal, Conjunctiva are pink and non-injected, sclera clear OROPHARYNX:no exudate, no erythema and lips, buccal mucosa, and tongue normal  NECK: supple, thyroid normal size, non-tender, without nodularity LYMPH:  no palpable lymphadenopathy in the cervical, axillary or inguinal LUNGS: clear to auscultation and percussion with normal breathing effort HEART: regular rate & rhythm and no murmurs and no lower extremity edema ABDOMEN:abdomen soft, non-tender and normal bowel sounds.  The palpable mass on her abdomen is  shrinking Musculoskeletal:no cyanosis of digits and no clubbing  NEURO: alert & oriented x 3 with fluent speech, no focal motor/sensory deficits  LABORATORY DATA:  I have reviewed the data as listed    Component Value Date/Time   NA 142 10/04/2017 1214   K 3.7 10/04/2017 1214   CL 108 10/04/2017 1214   CO2 29 10/04/2017 1214   GLUCOSE 80 10/04/2017 1214   BUN 13 10/04/2017 1214   CREATININE 0.72 10/04/2017 1214   CALCIUM 9.2 10/04/2017 1214   PROT 6.9 10/04/2017 1214   ALBUMIN 3.5 10/04/2017 1214   AST 21 10/04/2017 1214   ALT 17 10/04/2017 1214   ALKPHOS 146 10/04/2017 1214   BILITOT 0.5 10/04/2017 1214   GFRNONAA >60 10/04/2017 1214   GFRAA >60 10/04/2017 1214    No results found for: SPEP, UPEP  Lab Results  Component Value Date   WBC 3.7 (L) 10/04/2017   NEUTROABS 2.3 10/04/2017   HGB 8.1 (L) 08/21/2017   HCT 32.8 (L) 10/04/2017   MCV 85.4 10/04/2017   PLT 145 10/04/2017      Chemistry      Component Value Date/Time   NA 142 10/04/2017 1214   K 3.7 10/04/2017 1214   CL 108 10/04/2017 1214   CO2 29 10/04/2017 1214   BUN 13 10/04/2017 1214   CREATININE 0.72 10/04/2017 1214      Component Value Date/Time   CALCIUM 9.2 10/04/2017 1214   ALKPHOS 146 10/04/2017 1214   AST 21 10/04/2017 1214   ALT 17 10/04/2017 1214   BILITOT 0.5 10/04/2017 1214      All questions were answered. The patient knows to call the clinic with any problems, questions or concerns. No barriers to learning was detected.  I spent 20 minutes counseling the patient face to face. The total time spent in the appointment was 25 minutes and more than 50% was on counseling and review of test results  Heath Lark, MD 10/04/2017 2:02 PM

## 2017-10-04 NOTE — Telephone Encounter (Signed)
Gave avs and calendar ° °

## 2017-10-04 NOTE — Telephone Encounter (Signed)
-----   Message from Heath Lark, MD sent at 10/04/2017  1:10 PM EDT ----- Regarding: labs OK pls let her know labs are great Cancel labs next week I will put notes in her chart to use labs from today for chemo next week ----- Message ----- From: Interface, Lab In [place] Sent: 10/04/2017  12:44 PM To: Heath Lark, MD

## 2017-10-04 NOTE — Assessment & Plan Note (Signed)
She is doing very well with clinical positive response to treatment Vaginal bleeding has stopped She has minimum side effects from treatment We would proceed with cycle 3 of chemotherapy next week I plan to repeat imaging before I see her back in April

## 2017-10-04 NOTE — Assessment & Plan Note (Signed)
The size of the tumor in the liver is reduced Her liver function is normal CT scan as discussed next month

## 2017-10-04 NOTE — Assessment & Plan Note (Signed)
She has mild leukopenia from recent treatment She is not symptomatic Observe only

## 2017-10-04 NOTE — Telephone Encounter (Signed)
Talked with patient in lobby. Given below message, verbalized understanding.

## 2017-10-04 NOTE — Assessment & Plan Note (Signed)
The patient had require iron infusion and blood transfusion recently due to chronic blood loss from vaginal bleeding Since chemotherapy, her bleeding has stopped She is still mildly anemic but asymptomatic We discussed the risk and benefit of further intravenous iron infusion but the patient feels well enough and she does not feel she needed more iron infusion We discussed iron rich food substances

## 2017-10-08 ENCOUNTER — Telehealth: Payer: Self-pay | Admitting: Hematology and Oncology

## 2017-10-08 NOTE — Telephone Encounter (Signed)
10:02 am spoke with patient to confirm her [employer] Insurance has been successfully faxed to the [employer] @ [phone].  Pt requested that the original forms be left at front desk reception area for pick up.

## 2017-10-11 ENCOUNTER — Other Ambulatory Visit: Payer: BC Managed Care – PPO

## 2017-10-11 ENCOUNTER — Inpatient Hospital Stay: Payer: BC Managed Care – PPO

## 2017-10-11 ENCOUNTER — Ambulatory Visit: Payer: BC Managed Care – PPO

## 2017-10-11 VITALS — BP 112/75 | HR 66 | Temp 98.1°F | Resp 18 | Wt 123.1 lb

## 2017-10-11 DIAGNOSIS — C787 Secondary malignant neoplasm of liver and intrahepatic bile duct: Secondary | ICD-10-CM

## 2017-10-11 DIAGNOSIS — C530 Malignant neoplasm of endocervix: Secondary | ICD-10-CM | POA: Diagnosis not present

## 2017-10-11 DIAGNOSIS — D500 Iron deficiency anemia secondary to blood loss (chronic): Secondary | ICD-10-CM

## 2017-10-11 DIAGNOSIS — Z7189 Other specified counseling: Secondary | ICD-10-CM

## 2017-10-11 MED ORDER — POTASSIUM CHLORIDE 2 MEQ/ML IV SOLN
Freq: Once | INTRAVENOUS | Status: AC
  Administered 2017-10-11: 08:00:00 via INTRAVENOUS
  Filled 2017-10-11: qty 10

## 2017-10-11 MED ORDER — HEPARIN SOD (PORK) LOCK FLUSH 100 UNIT/ML IV SOLN
500.0000 [IU] | Freq: Once | INTRAVENOUS | Status: AC | PRN
  Administered 2017-10-11: 500 [IU]
  Filled 2017-10-11: qty 5

## 2017-10-11 MED ORDER — SODIUM CHLORIDE 0.9% FLUSH
10.0000 mL | INTRAVENOUS | Status: DC | PRN
Start: 2017-10-11 — End: 2017-10-11
  Administered 2017-10-11: 10 mL
  Filled 2017-10-11: qty 10

## 2017-10-11 MED ORDER — SODIUM CHLORIDE 0.9 % IV SOLN
Freq: Once | INTRAVENOUS | Status: AC
  Administered 2017-10-11: 10:00:00 via INTRAVENOUS

## 2017-10-11 MED ORDER — FAMOTIDINE IN NACL 20-0.9 MG/50ML-% IV SOLN
20.0000 mg | Freq: Once | INTRAVENOUS | Status: AC
  Administered 2017-10-11: 20 mg via INTRAVENOUS

## 2017-10-11 MED ORDER — ACETAMINOPHEN 325 MG PO TABS
ORAL_TABLET | ORAL | Status: AC
  Filled 2017-10-11: qty 2

## 2017-10-11 MED ORDER — ACETAMINOPHEN 325 MG PO TABS
650.0000 mg | ORAL_TABLET | Freq: Once | ORAL | Status: AC
  Administered 2017-10-11: 650 mg via ORAL

## 2017-10-11 MED ORDER — SODIUM CHLORIDE 0.9 % IV SOLN
50.0000 mg/m2 | Freq: Once | INTRAVENOUS | Status: AC
  Administered 2017-10-11: 78 mg via INTRAVENOUS
  Filled 2017-10-11: qty 78

## 2017-10-11 MED ORDER — SODIUM CHLORIDE 0.9 % IV SOLN
Freq: Once | INTRAVENOUS | Status: AC
  Administered 2017-10-11: 10:00:00 via INTRAVENOUS
  Filled 2017-10-11: qty 5

## 2017-10-11 MED ORDER — SODIUM CHLORIDE 0.9 % IV SOLN
135.0000 mg/m2 | Freq: Once | INTRAVENOUS | Status: AC
  Administered 2017-10-11: 210 mg via INTRAVENOUS
  Filled 2017-10-11: qty 35

## 2017-10-11 MED ORDER — FAMOTIDINE IN NACL 20-0.9 MG/50ML-% IV SOLN
INTRAVENOUS | Status: AC
  Filled 2017-10-11: qty 50

## 2017-10-11 MED ORDER — PALONOSETRON HCL INJECTION 0.25 MG/5ML
0.2500 mg | Freq: Once | INTRAVENOUS | Status: AC
  Administered 2017-10-11: 0.25 mg via INTRAVENOUS

## 2017-10-11 MED ORDER — PALONOSETRON HCL INJECTION 0.25 MG/5ML
INTRAVENOUS | Status: AC
  Filled 2017-10-11: qty 5

## 2017-10-11 NOTE — Progress Notes (Signed)
Per Dr. Alvy Bimler, hold pt avastin today related to urine protein being high. Pt educated and states understanding.

## 2017-10-11 NOTE — Patient Instructions (Addendum)
[facility] Discharge Instructions for Patients Receiving Chemotherapy  Today you received the following chemotherapy agents Taxol and Cisplatin  To help prevent nausea and vomiting after your treatment, we encourage you to take your nausea medication as directed   If you develop nausea and vomiting that is not controlled by your nausea medication, call the clinic.   BELOW ARE SYMPTOMS THAT SHOULD BE REPORTED IMMEDIATELY:  *FEVER GREATER THAN 100.5 F  *CHILLS WITH OR WITHOUT FEVER  NAUSEA AND VOMITING THAT IS NOT CONTROLLED WITH YOUR NAUSEA MEDICATION  *UNUSUAL SHORTNESS OF BREATH  *UNUSUAL BRUISING OR BLEEDING  TENDERNESS IN MOUTH AND THROAT WITH OR WITHOUT PRESENCE OF ULCERS  *URINARY PROBLEMS  *BOWEL PROBLEMS  UNUSUAL RASH Items with * indicate a potential emergency and should be followed up as soon as possible.  Feel free to call the clinic should you have any questions or concerns. The clinic phone number is [phone].  Please show the [place] at check-in to the Emergency Department and triage nurse.

## 2017-10-16 ENCOUNTER — Telehealth: Payer: Self-pay

## 2017-10-16 NOTE — Telephone Encounter (Signed)
-----   Message from Heath Lark, MD sent at 10/16/2017  7:51 AM EDT ----- Regarding: labs/flush before CT Can you call her and ask if it's OK to get labs/flush an hour before CT next week? Will save her a stick in the radiology. Then OK to cancel labs on Friday when I see her. If ok go ahead and schedule  Thanks

## 2017-10-16 NOTE — Telephone Encounter (Signed)
Given below message. Verbalized understanding. Scheduling message sent for lab/ flush appt at 0915 on 4/4.

## 2017-10-16 NOTE — Telephone Encounter (Signed)
Called and left below message. Verbalized understanding.

## 2017-10-17 ENCOUNTER — Other Ambulatory Visit: Payer: Self-pay

## 2017-10-17 ENCOUNTER — Other Ambulatory Visit: Payer: Self-pay | Admitting: Hematology and Oncology

## 2017-10-17 MED ORDER — ALPRAZOLAM 0.5 MG PO TABS: 0.5000 mg | ORAL_TABLET | Freq: Every evening | ORAL | 0 refills | Status: DC | PRN

## 2017-10-24 ENCOUNTER — Inpatient Hospital Stay: Payer: BC Managed Care – PPO | Attending: Hematology and Oncology

## 2017-10-24 ENCOUNTER — Ambulatory Visit (HOSPITAL_COMMUNITY)
Admission: RE | Admit: 2017-10-24 | Discharge: 2017-10-24 | Disposition: A | Discharge status: Home / Self Care | Payer: BC Managed Care – PPO | Source: Ambulatory Visit | Attending: Hematology and Oncology | Admitting: Hematology and Oncology

## 2017-10-24 ENCOUNTER — Inpatient Hospital Stay: Payer: BC Managed Care – PPO

## 2017-10-24 DIAGNOSIS — N1330 Unspecified hydronephrosis: Secondary | ICD-10-CM | POA: Insufficient documentation

## 2017-10-24 DIAGNOSIS — D259 Leiomyoma of uterus, unspecified: Secondary | ICD-10-CM | POA: Insufficient documentation

## 2017-10-24 DIAGNOSIS — C787 Secondary malignant neoplasm of liver and intrahepatic bile duct: Secondary | ICD-10-CM | POA: Insufficient documentation

## 2017-10-24 DIAGNOSIS — D500 Iron deficiency anemia secondary to blood loss (chronic): Secondary | ICD-10-CM | POA: Insufficient documentation

## 2017-10-24 DIAGNOSIS — C530 Malignant neoplasm of endocervix: Secondary | ICD-10-CM | POA: Insufficient documentation

## 2017-10-24 DIAGNOSIS — D701 Agranulocytosis secondary to cancer chemotherapy: Secondary | ICD-10-CM | POA: Diagnosis not present

## 2017-10-24 DIAGNOSIS — T451X5A Adverse effect of antineoplastic and immunosuppressive drugs, initial encounter: Secondary | ICD-10-CM | POA: Diagnosis not present

## 2017-10-24 DIAGNOSIS — Z79899 Other long term (current) drug therapy: Secondary | ICD-10-CM | POA: Insufficient documentation

## 2017-10-24 DIAGNOSIS — C541 Malignant neoplasm of endometrium: Secondary | ICD-10-CM

## 2017-10-24 DIAGNOSIS — Z7189 Other specified counseling: Secondary | ICD-10-CM

## 2017-10-24 LAB — CMP (CANCER CENTER ONLY)
ALBUMIN: 3.9 g/dL (ref 3.5–5.0)
ALT: 28 U/L (ref 0–55)
AST: 28 U/L (ref 5–34)
Alkaline Phosphatase: 149 U/L (ref 40–150)
Anion gap: 7 (ref 3–11)
BUN: 14 mg/dL (ref 7–26)
CHLORIDE: 107 mmol/L (ref 98–109)
CO2: 27 mmol/L (ref 22–29)
Calcium: 9.7 mg/dL (ref 8.4–10.4)
Creatinine: 0.7 mg/dL (ref 0.60–1.10)
GFR, Est AFR Am: >60 mL/min (ref >60)
Glucose, Bld: 86 mg/dL (ref 70–140)
POTASSIUM: 3.9 mmol/L (ref 3.5–5.1)
Sodium: 141 mmol/L (ref 136–145)
Total Bilirubin: 0.3 mg/dL (ref 0.2–1.2)
Total Protein: 7.1 g/dL (ref 6.4–8.3)

## 2017-10-24 LAB — CBC WITH DIFFERENTIAL (CANCER CENTER ONLY)
Basophils Absolute: 0 10*3/uL (ref 0.0–0.1)
Basophils Relative: 1 %
EOS PCT: 1 %
Eosinophils Absolute: 0 10*3/uL (ref 0.0–0.5)
HEMATOCRIT: 33.8 % — AB (ref 34.8–46.6)
HEMOGLOBIN: 10.8 g/dL — AB (ref 11.6–15.9)
LYMPHS ABS: 1.1 10*3/uL (ref 0.9–3.3)
LYMPHS PCT: 50 %
MCH: 28.6 pg (ref 25.1–34.0)
MCHC: 32 g/dL (ref 31.5–36.0)
MCV: 89.4 fL (ref 79.5–101.0)
Monocytes Absolute: 0.2 10*3/uL (ref 0.1–0.9)
Monocytes Relative: 9 %
NEUTROS ABS: 0.9 10*3/uL — AB (ref 1.5–6.5)
NEUTROS PCT: 39 %
Platelet Count: 206 10*3/uL (ref 145–400)
RBC: 3.78 MIL/uL (ref 3.70–5.45)
RDW: 23.7 % — ABNORMAL HIGH (ref 11.2–14.5)
WBC: 2.2 10*3/uL — AB (ref 3.9–10.3)

## 2017-10-24 LAB — SAMPLE TO BLOOD BANK

## 2017-10-24 LAB — TOTAL PROTEIN, URINE DIPSTICK: Protein, ur: NEGATIVE mg/dL

## 2017-10-24 LAB — MAGNESIUM: Magnesium: 2.2 mg/dL (ref 1.5–2.5)

## 2017-10-24 MED ORDER — HEPARIN SOD (PORK) LOCK FLUSH 100 UNIT/ML IV SOLN
500.0000 [IU] | Freq: Once | INTRAVENOUS | Status: AC
  Administered 2017-10-24: 500 [IU] via INTRAVENOUS

## 2017-10-24 MED ORDER — SODIUM CHLORIDE 0.9% FLUSH
10.0000 mL | Freq: Once | INTRAVENOUS | Status: AC
  Administered 2017-10-24: 10 mL
  Filled 2017-10-24: qty 10

## 2017-10-24 MED ORDER — IOPAMIDOL (ISOVUE-300) INJECTION 61%
100.0000 mL | Freq: Once | INTRAVENOUS | Status: AC | PRN
  Administered 2017-10-24: 100 mL via INTRAVENOUS

## 2017-10-24 MED ORDER — HEPARIN SOD (PORK) LOCK FLUSH 100 UNIT/ML IV SOLN
INTRAVENOUS | Status: AC
  Filled 2017-10-24: qty 5

## 2017-10-24 MED ORDER — IOPAMIDOL (ISOVUE-300) INJECTION 61%
INTRAVENOUS | Status: AC
  Filled 2017-10-24: qty 100

## 2017-10-24 NOTE — Patient Instructions (Signed)
Implanted Port Home Guide An implanted port is a type of central line that is placed under the skin. Central lines are used to provide IV access when treatment or nutrition needs to be given through a person's veins. Implanted ports are used for long-term IV access. An implanted port may be placed because:  You need IV medicine that would be irritating to the small veins in your hands or arms.  You need long-term IV medicines, such as antibiotics.  You need IV nutrition for a long period.  You need frequent blood draws for lab tests.  You need dialysis.  Implanted ports are usually placed in the chest area, but they can also be placed in the upper arm, the abdomen, or the leg. An implanted port has two main parts:  Reservoir. The reservoir is round and will appear as a small, raised area under your skin. The reservoir is the part where a needle is inserted to give medicines or draw blood.  Catheter. The catheter is a thin, flexible tube that extends from the reservoir. The catheter is placed into a large vein. Medicine that is inserted into the reservoir goes into the catheter and then into the vein.  How will I care for my incision site? Do not get the incision site wet. Bathe or shower as directed by your health care provider. How is my port accessed? Special steps must be taken to access the port:  Before the port is accessed, a numbing cream can be placed on the skin. This helps numb the skin over the port site.  Your health care provider uses a sterile technique to access the port. ? Your health care provider must put on a mask and sterile gloves. ? The skin over your port is cleaned carefully with an antiseptic and allowed to dry. ? The port is gently pinched between sterile gloves, and a needle is inserted into the port.  Only "non-coring" port needles should be used to access the port. Once the port is accessed, a blood return should be checked. This helps ensure that the port  is in the vein and is not clogged.  If your port needs to remain accessed for a constant infusion, a clear (transparent) bandage will be placed over the needle site. The bandage and needle will need to be changed every week, or as directed by your health care provider.  Keep the bandage covering the needle clean and dry. Do not get it wet. Follow your health care provider's instructions on how to take a shower or bath while the port is accessed.  If your port does not need to stay accessed, no bandage is needed over the port.  What is flushing? Flushing helps keep the port from getting clogged. Follow your health care provider's instructions on how and when to flush the port. Ports are usually flushed with saline solution or a medicine called heparin. The need for flushing will depend on how the port is used.  If the port is used for intermittent medicines or blood draws, the port will need to be flushed: ? After medicines have been given. ? After blood has been drawn. ? As part of routine maintenance.  If a constant infusion is running, the port may not need to be flushed.  How long will my port stay implanted? The port can stay in for as long as your health care provider thinks it is needed. When it is time for the port to come out, surgery will be   done to remove it. The procedure is similar to the one performed when the port was put in. When should I seek immediate medical care? When you have an implanted port, you should seek immediate medical care if:  You notice a bad smell coming from the incision site.  You have swelling, redness, or drainage at the incision site.  You have more swelling or pain at the port site or the surrounding area.  You have a fever that is not controlled with medicine.  This information is not intended to replace advice given to you by your health care provider. Make sure you discuss any questions you have with your health care provider. Document  Released: 07/09/2005 Document Revised: 12/15/2015 Document Reviewed: 03/16/2013 Elsevier Interactive Patient Education  2017 [employer]  

## 2017-10-25 ENCOUNTER — Other Ambulatory Visit: Payer: BC Managed Care – PPO

## 2017-10-25 ENCOUNTER — Telehealth: Payer: Self-pay | Admitting: Hematology and Oncology

## 2017-10-25 ENCOUNTER — Inpatient Hospital Stay (HOSPITAL_BASED_OUTPATIENT_CLINIC_OR_DEPARTMENT_OTHER): Payer: BC Managed Care – PPO | Admitting: Hematology and Oncology

## 2017-10-25 ENCOUNTER — Encounter: Payer: Self-pay | Admitting: Hematology and Oncology

## 2017-10-25 DIAGNOSIS — T451X5A Adverse effect of antineoplastic and immunosuppressive drugs, initial encounter: Secondary | ICD-10-CM | POA: Diagnosis not present

## 2017-10-25 DIAGNOSIS — Z79899 Other long term (current) drug therapy: Secondary | ICD-10-CM

## 2017-10-25 DIAGNOSIS — C787 Secondary malignant neoplasm of liver and intrahepatic bile duct: Secondary | ICD-10-CM

## 2017-10-25 DIAGNOSIS — D701 Agranulocytosis secondary to cancer chemotherapy: Secondary | ICD-10-CM

## 2017-10-25 DIAGNOSIS — C530 Malignant neoplasm of endocervix: Secondary | ICD-10-CM

## 2017-10-25 DIAGNOSIS — D500 Iron deficiency anemia secondary to blood loss (chronic): Secondary | ICD-10-CM | POA: Diagnosis not present

## 2017-10-25 NOTE — Assessment & Plan Note (Signed)
Anemia is improving Observe only for now

## 2017-10-25 NOTE — Assessment & Plan Note (Signed)
We reviewed CT imaging together She had excellent response to chemotherapy without major side effects We will continue treatment as scheduled for another 3 more cycles before repeat imaging study She is mildly neutropenic likely due to her blood drawn too soon She is willing to return next week for blood count draw before her chemotherapy is scheduled for 11/01/2017 I plan to see her back in 3 weeks before her next appointment

## 2017-10-25 NOTE — Telephone Encounter (Signed)
Gave avs and calendar ° °

## 2017-10-25 NOTE — Assessment & Plan Note (Signed)
She has excellent response to treatment Liver function tests were within normal limits The patient is delighted to see this

## 2017-10-25 NOTE — Progress Notes (Signed)
[facility] OFFICE PROGRESS NOTE  Patient Care Team: Avel Sensor, MD as PCP - General (Obstetrics and Gynecology)  ASSESSMENT & PLAN:  Cervical cancer [facility]) We reviewed CT imaging together She had excellent response to chemotherapy without major side effects We will continue treatment as scheduled for another 3 more cycles before repeat imaging study She is mildly neutropenic likely due to her blood drawn too soon She is willing to return next week for blood count draw before her chemotherapy is scheduled for 11/01/2017 I plan to see her back in 3 weeks before her next appointment  Leukopenia due to antineoplastic chemotherapy [facility]) She denies recent infection Plan to recheck her blood count again next week before her next treatment  Iron deficiency anemia due to chronic blood loss Anemia is improving Observe only for now  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[facility]) She has excellent response to treatment Liver function tests were within normal limits The patient is delighted to see this   No orders of the defined types were placed in this encounter.   INTERVAL HISTORY: Please see below for problem oriented charting. She returns with her sister for further follow-up She tolerated recent chemotherapy well Denies peripheral neuropathy, nausea or vomiting She is eating well and continues to gain weight She denies abdominal pain  SUMMARY OF ONCOLOGIC HISTORY:   Cervical cancer ([place])   07/22/2017 Initial Diagnosis    The patient was examined in the wellness center.  Upon vaginal exam, friable mass of tissue presumably from the cervix was interpreted as poorly differentiated high-grade malignant neoplasm.  Follow-up workup with ultrasound of the uterus revealed multiple large uterine fibroids.      07/22/2017 Pathology Results    Biopsy confirmed poorly differentiated high-grade malignant neoplasm.      08/06/2017 Imaging    6.6 cm uterine mass in area of cervix and  upper vagina, highly suspicious for cervical carcinoma.  Mild bilateral iliac lymphadenopathy, consistent with metastatic disease. No abdominal lymphadenopathy identified.  Multiple large liver masses, with areas of central necrosis or scarring, highly suspicious for liver metastases. Although unlikely, it is conceivable that some of these lesions could represent other etiology such as focal nodular hyperplasia or adenomas. Consider further evaluation with PET-CT, abdomen MRI without and with contrast, or percutaneous needle biopsy.  Multiple large uterine fibroids, many showing cystic degeneration.  Bilateral cystic adnexal lesions with probably benign features. Differential diagnosis includes ovarian cysts, endometriomas, and hydrosalpinges. These could also be further assessed on PET-CT or MRI.  No evidence of thoracic metastatic disease.       08/09/2017 Tumor Marker    Patient's tumor was tested for the following markers: CA-125 Results of the tumor marker test revealed 79.2      08/14/2017 Pathology Results    Liver, needle/core biopsy, left hepatic lobe - METASTATIC POORLY DIFFERENTIATED CARCINOMA, SEE COMMENT. Microscopic Comment The tumor is poorly differentiated with abundant necrosis. Pancytokeratin, PAX-8, cytokeratin 7, and p16 are positive. There is weak non-specific staining with TTF-1, synaptophysin (very focal), p63 (very focal). Chromogranin, CD56, S100, desmin, SMA, ER, CD10, cytokeratin 10, cytokeratin 5/6, and cytokeratin 20 are negative. Thus, the immunoprofile is suggest of a gynecologic primary. Dr. Lyndon Code has reviewed the case.       08/21/2017 Procedure    Ultrasound and fluoroscopically guided right internal jugular single lumen power port catheter insertion. Tip in the SVC/RA junction. Catheter ready for use.      08/30/2017 -  Chemotherapy    The patient  had received cisplatin and Taxol      10/24/2017 Imaging    Interval resolution of large cervical  mass and bilateral iliac lymphadenopathy since previous study.  Significant decrease in size of diffuse liver metastases since prior study. No new or progressive metastatic disease identified.  Stable probably benign cystic lesion in right adnexa. Previously seen left adnexal cystic lesion has resolved since prior study.  Stable large uterine fibroids. Increased mild right hydronephrosis due to extrinsic compression of ureter by enlarged uterus.       REVIEW OF SYSTEMS:   Constitutional: Denies fevers, chills or abnormal weight loss Eyes: Denies blurriness of vision Ears, nose, mouth, throat, and face: Denies mucositis or sore throat Respiratory: Denies cough, dyspnea or wheezes Cardiovascular: Denies palpitation, chest discomfort or lower extremity swelling Gastrointestinal:  Denies nausea, heartburn or change in bowel habits Skin: Denies abnormal skin rashes Lymphatics: Denies new lymphadenopathy or easy bruising Neurological:Denies numbness, tingling or new weaknesses Behavioral/Psych: Mood is stable, no new changes  All other systems were reviewed with the patient and are negative.  I have reviewed the past medical history, past surgical history, social history and family history with the patient and they are unchanged from previous note.  ALLERGIES:  is allergic to benadryl [diphenhydramine].  MEDICATIONS:  Current Outpatient Medications  Medication Sig Dispense Refill  . acetaminophen (TYLENOL) 325 MG tablet Take 650 mg by mouth every 6 (six) hours as needed.    . ALPRAZolam (XANAX) 0.5 MG tablet Take 1 tablet (0.5 mg total) by mouth at bedtime as needed for anxiety. 60 tablet 0  . lidocaine-prilocaine (EMLA) cream Apply to affected area once 30 g 3  . mirtazapine (REMERON) 30 MG tablet Take 1 tablet (30 mg total) by mouth at bedtime. 30 tablet 11  . ondansetron (ZOFRAN) 8 MG tablet Take 1 tablet (8 mg total) by mouth 2 (two) times daily as needed. Start on the third day  after chemotherapy. 30 tablet 1  . prochlorperazine (COMPAZINE) 10 MG tablet Take 1 tablet (10 mg total) by mouth every 6 (six) hours as needed for nausea or vomiting. 30 tablet 0   No current facility-administered medications for this visit.     PHYSICAL EXAMINATION: ECOG PERFORMANCE STATUS: 1 - Symptomatic but completely ambulatory  Vitals:   10/25/17 0951  BP: 127/71  Pulse: 83  Resp: 18  Temp: 98.1 F (36.7 C)  SpO2: 100%   Filed Weights   10/25/17 0951  Weight: 127 lb 9.6 oz (57.9 kg)    GENERAL:alert, no distress and comfortable SKIN: skin color, texture, turgor are normal, no rashes or significant lesions EYES: normal, Conjunctiva are pink and non-injected, sclera clear OROPHARYNX:no exudate, no erythema and lips, buccal mucosa, and tongue normal  NECK: supple, thyroid normal size, non-tender, without nodularity LYMPH:  no palpable lymphadenopathy in the cervical, axillary or inguinal LUNGS: clear to auscultation and percussion with normal breathing effort HEART: regular rate & rhythm and no murmurs and no lower extremity edema ABDOMEN:abdomen soft, non-tender and normal bowel sounds Musculoskeletal:no cyanosis of digits and no clubbing  NEURO: alert & oriented x 3 with fluent speech, no focal motor/sensory deficits  LABORATORY DATA:  I have reviewed the data as listed    Component Value Date/Time   NA 141 10/24/2017 0907   K 3.9 10/24/2017 0907   CL 107 10/24/2017 0907   CO2 27 10/24/2017 0907   GLUCOSE 86 10/24/2017 0907   BUN 14 10/24/2017 0907   CREATININE 0.70 10/24/2017 0907  CALCIUM 9.7 10/24/2017 0907   PROT 7.1 10/24/2017 0907   ALBUMIN 3.9 10/24/2017 0907   AST 28 10/24/2017 0907   ALT 28 10/24/2017 0907   ALKPHOS 149 10/24/2017 0907   BILITOT 0.3 10/24/2017 0907   GFRNONAA >60 10/24/2017 0907   GFRAA >60 10/24/2017 0907    No results found for: SPEP, UPEP  Lab Results  Component Value Date   WBC 2.2 (L) 10/24/2017   NEUTROABS 0.9 (L)  10/24/2017   HGB 8.1 (L) 08/21/2017   HCT 33.8 (L) 10/24/2017   MCV 89.4 10/24/2017   PLT 206 10/24/2017      Chemistry      Component Value Date/Time   NA 141 10/24/2017 0907   K 3.9 10/24/2017 0907   CL 107 10/24/2017 0907   CO2 27 10/24/2017 0907   BUN 14 10/24/2017 0907   CREATININE 0.70 10/24/2017 0907      Component Value Date/Time   CALCIUM 9.7 10/24/2017 0907   ALKPHOS 149 10/24/2017 0907   AST 28 10/24/2017 0907   ALT 28 10/24/2017 0907   BILITOT 0.3 10/24/2017 0907       RADIOGRAPHIC STUDIES: I have reviewed multiple imaging study with the patient and family I have personally reviewed the radiological images as listed and agreed with the findings in the report. Ct Abdomen Pelvis W Contrast  Result Date: 10/24/2017 CLINICAL DATA:  Followup metastatic cervical carcinoma. Ongoing chemotherapy. EXAM: CT ABDOMEN AND PELVIS WITH CONTRAST TECHNIQUE: Multidetector CT imaging of the abdomen and pelvis was performed using the standard protocol following bolus administration of intravenous contrast. CONTRAST:  141mL ISOVUE-300 IOPAMIDOL (ISOVUE-300) INJECTION 61% COMPARISON:  08/06/2017 FINDINGS: Lower Chest: No acute findings. Hepatobiliary: Multiple liver metastases are significantly decreased in size since previous study. Index lesion in segment 3 of the left lobe measures 8.5 x 5.4 cm on image 37/2, compared to 12.2 x 9.6 cm previously. Index lesion in the right hepatic lobe currently measures 6.1 x 4.1 cm on image 26/2, compared to 9.3 x 7.1 cm previously. No new or enlarging liver masses identified. Gallbladder is unremarkable. Pancreas:  No mass or inflammatory changes. Spleen: Within normal limits in size and appearance. Adrenals/Urinary Tract: No masses identified. Mild right hydronephrosis is increased, and appears to be due to extrinsic ureteral compression by enlarged myomatous uterus. No evidence of left-sided hydronephrosis. Unremarkable unopacified urinary bladder.  Stomach/Bowel: No evidence of obstruction, inflammatory process or abnormal fluid collections. Vascular/Lymphatic: Previously seen bilateral iliac lymphadenopathy has resolved since previous study. No new areas of lymphadenopathy identified. Aortic atherosclerosis. Reproductive: Multiple large uterine fibroids are again seen, many of which have peripheral calcification. These remains stable. Previously seen soft tissue mass in the area of the cervix is no longer visualized. A cystic lesion in the right posterior adnexa measures 5.8 x 3.5 cm compared to 5.5 by 3.3 cm previously. Previously seen cystic lesion in the left adnexa has resolved since prior exam. Other:  No evidence of ascites. Musculoskeletal:  No suspicious bone lesions identified. IMPRESSION: Interval resolution of large cervical mass and bilateral iliac lymphadenopathy since previous study. Significant decrease in size of diffuse liver metastases since prior study. No new or progressive metastatic disease identified. Stable probably benign cystic lesion in right adnexa. Previously seen left adnexal cystic lesion has resolved since prior study. Stable large uterine fibroids. Increased mild right hydronephrosis due to extrinsic compression of ureter by enlarged uterus. Electronically Signed   By: Earle Gell M.D.   On: 10/24/2017 15:22  All questions were answered. The patient knows to call the clinic with any problems, questions or concerns. No barriers to learning was detected.  I spent 25 minutes counseling the patient face to face. The total time spent in the appointment was 30 minutes and more than 50% was on counseling and review of test results  Heath Lark, MD 10/25/2017 4:33 PM

## 2017-10-25 NOTE — Assessment & Plan Note (Signed)
She denies recent infection Plan to recheck her blood count again next week before her next treatment

## 2017-10-28 ENCOUNTER — Telehealth: Payer: Self-pay | Admitting: Hematology and Oncology

## 2017-10-28 ENCOUNTER — Telehealth: Payer: Self-pay | Admitting: *Deleted

## 2017-10-28 NOTE — Telephone Encounter (Signed)
Left message for patient regarding upcoming may appointments

## 2017-10-28 NOTE — Telephone Encounter (Signed)
Faxed ROI to [facility] 10/28/17 @ 1:12pm, attn: Eliezer Champagne; release # [id number]

## 2017-10-31 ENCOUNTER — Telehealth: Payer: Self-pay

## 2017-10-31 ENCOUNTER — Inpatient Hospital Stay: Payer: BC Managed Care – PPO

## 2017-10-31 ENCOUNTER — Other Ambulatory Visit: Payer: Self-pay | Admitting: Hematology and Oncology

## 2017-10-31 DIAGNOSIS — C787 Secondary malignant neoplasm of liver and intrahepatic bile duct: Secondary | ICD-10-CM

## 2017-10-31 DIAGNOSIS — Z7189 Other specified counseling: Secondary | ICD-10-CM

## 2017-10-31 DIAGNOSIS — D500 Iron deficiency anemia secondary to blood loss (chronic): Secondary | ICD-10-CM

## 2017-10-31 DIAGNOSIS — C530 Malignant neoplasm of endocervix: Secondary | ICD-10-CM | POA: Diagnosis not present

## 2017-10-31 LAB — CBC WITH DIFFERENTIAL (CANCER CENTER ONLY)
BASOS PCT: 0 %
Basophils Absolute: 0 10*3/uL (ref 0.0–0.1)
EOS ABS: 0 10*3/uL (ref 0.0–0.5)
Eosinophils Relative: 1 %
HEMATOCRIT: 38.4 % (ref 34.8–46.6)
HEMOGLOBIN: 12.4 g/dL (ref 11.6–15.9)
LYMPHS ABS: 1.3 10*3/uL (ref 0.9–3.3)
Lymphocytes Relative: 37 %
MCH: 29.7 pg (ref 25.1–34.0)
MCHC: 32.3 g/dL (ref 31.5–36.0)
MCV: 91.9 fL (ref 79.5–101.0)
Monocytes Absolute: 0.4 10*3/uL (ref 0.1–0.9)
Monocytes Relative: 10 %
NEUTROS ABS: 1.8 10*3/uL (ref 1.5–6.5)
NEUTROS PCT: 52 %
Platelet Count: 212 10*3/uL (ref 145–400)
RBC: 4.18 MIL/uL (ref 3.70–5.45)
RDW: 22.7 % — ABNORMAL HIGH (ref 11.2–14.5)
WBC: 3.5 10*3/uL — AB (ref 3.9–10.3)

## 2017-10-31 LAB — CMP (CANCER CENTER ONLY)
ALT: 32 U/L (ref 0–55)
ANION GAP: 10 (ref 3–11)
AST: 41 U/L — ABNORMAL HIGH (ref 5–34)
Albumin: 4.1 g/dL (ref 3.5–5.0)
Alkaline Phosphatase: 157 U/L — ABNORMAL HIGH (ref 40–150)
BUN: 13 mg/dL (ref 7–26)
CALCIUM: 9.8 mg/dL (ref 8.4–10.4)
CHLORIDE: 108 mmol/L (ref 98–109)
CO2: 25 mmol/L (ref 22–29)
CREATININE: 0.82 mg/dL (ref 0.60–1.10)
Glucose, Bld: 107 mg/dL (ref 70–140)
Potassium: 3.7 mmol/L (ref 3.5–5.1)
SODIUM: 143 mmol/L (ref 136–145)
Total Bilirubin: 0.2 mg/dL (ref 0.2–1.2)
Total Protein: 8 g/dL (ref 6.4–8.3)

## 2017-10-31 LAB — MAGNESIUM: MAGNESIUM: 2.2 mg/dL (ref 1.5–2.5)

## 2017-10-31 LAB — TOTAL PROTEIN, URINE DIPSTICK: PROTEIN: NEGATIVE mg/dL

## 2017-10-31 NOTE — Telephone Encounter (Signed)
-----   Message from Heath Lark, MD sent at 10/31/2017  9:34 AM EDT ----- Regarding: labs good Let her know labs are good ----- Message ----- From: Interface, Lab In [place] Sent: 10/31/2017   8:29 AM To: Heath Lark, MD

## 2017-10-31 NOTE — Telephone Encounter (Signed)
Called with below message. Verbalized understanding. 

## 2017-11-01 ENCOUNTER — Inpatient Hospital Stay: Payer: BC Managed Care – PPO

## 2017-11-01 ENCOUNTER — Other Ambulatory Visit: Payer: Self-pay | Admitting: Hematology and Oncology

## 2017-11-01 ENCOUNTER — Telehealth: Payer: Self-pay

## 2017-11-01 VITALS — BP 123/76 | HR 65 | Temp 98.6°F | Resp 18

## 2017-11-01 DIAGNOSIS — D500 Iron deficiency anemia secondary to blood loss (chronic): Secondary | ICD-10-CM

## 2017-11-01 DIAGNOSIS — Z7189 Other specified counseling: Secondary | ICD-10-CM

## 2017-11-01 DIAGNOSIS — C530 Malignant neoplasm of endocervix: Secondary | ICD-10-CM | POA: Diagnosis not present

## 2017-11-01 DIAGNOSIS — C787 Secondary malignant neoplasm of liver and intrahepatic bile duct: Secondary | ICD-10-CM

## 2017-11-01 MED ORDER — POTASSIUM CHLORIDE 2 MEQ/ML IV SOLN
Freq: Once | INTRAVENOUS | Status: AC
  Administered 2017-11-01: 08:00:00 via INTRAVENOUS
  Filled 2017-11-01: qty 10

## 2017-11-01 MED ORDER — FAMOTIDINE IN NACL 20-0.9 MG/50ML-% IV SOLN
20.0000 mg | Freq: Once | INTRAVENOUS | Status: AC
  Administered 2017-11-01: 20 mg via INTRAVENOUS

## 2017-11-01 MED ORDER — SODIUM CHLORIDE 0.9% FLUSH
10.0000 mL | INTRAVENOUS | Status: DC | PRN
  Administered 2017-11-01: 10 mL
  Filled 2017-11-01: qty 10

## 2017-11-01 MED ORDER — PALONOSETRON HCL INJECTION 0.25 MG/5ML
0.2500 mg | Freq: Once | INTRAVENOUS | Status: AC
  Administered 2017-11-01: 0.25 mg via INTRAVENOUS

## 2017-11-01 MED ORDER — SODIUM CHLORIDE 0.9 % IV SOLN: Freq: Once | INTRAVENOUS | Status: DC

## 2017-11-01 MED ORDER — PACLITAXEL CHEMO INJECTION 300 MG/50ML
135.0000 mg/m2 | Freq: Once | INTRAVENOUS | Status: AC
  Administered 2017-11-01: 210 mg via INTRAVENOUS
  Filled 2017-11-01: qty 35

## 2017-11-01 MED ORDER — HEPARIN SOD (PORK) LOCK FLUSH 100 UNIT/ML IV SOLN
500.0000 [IU] | Freq: Once | INTRAVENOUS | Status: AC | PRN
  Administered 2017-11-01: 500 [IU]
  Filled 2017-11-01: qty 5

## 2017-11-01 MED ORDER — SODIUM CHLORIDE 0.9 % IV SOLN
15.0000 mg/kg | Freq: Once | INTRAVENOUS | Status: AC
  Administered 2017-11-01: 800 mg via INTRAVENOUS
  Filled 2017-11-01: qty 32

## 2017-11-01 MED ORDER — SODIUM CHLORIDE 0.9 % IV SOLN
50.0000 mg/m2 | Freq: Once | INTRAVENOUS | Status: AC
  Administered 2017-11-01: 78 mg via INTRAVENOUS
  Filled 2017-11-01: qty 78

## 2017-11-01 MED ORDER — PALONOSETRON HCL INJECTION 0.25 MG/5ML
INTRAVENOUS | Status: AC
  Filled 2017-11-01: qty 5

## 2017-11-01 MED ORDER — ACETAMINOPHEN 325 MG PO TABS
ORAL_TABLET | ORAL | Status: AC
  Filled 2017-11-01: qty 2

## 2017-11-01 MED ORDER — FAMOTIDINE IN NACL 20-0.9 MG/50ML-% IV SOLN
INTRAVENOUS | Status: AC
  Filled 2017-11-01: qty 50

## 2017-11-01 MED ORDER — SODIUM CHLORIDE 0.9 % IV SOLN
Freq: Once | INTRAVENOUS | Status: AC
  Administered 2017-11-01: 10:00:00 via INTRAVENOUS

## 2017-11-01 MED ORDER — SODIUM CHLORIDE 0.9 % IV SOLN
510.0000 mg | Freq: Once | INTRAVENOUS | Status: DC
  Filled 2017-11-01: qty 17

## 2017-11-01 MED ORDER — ACETAMINOPHEN 325 MG PO TABS
650.0000 mg | ORAL_TABLET | Freq: Once | ORAL | Status: AC
  Administered 2017-11-01: 650 mg via ORAL

## 2017-11-01 MED ORDER — SODIUM CHLORIDE 0.9 % IV SOLN
Freq: Once | INTRAVENOUS | Status: AC
  Administered 2017-11-01: 11:00:00 via INTRAVENOUS
  Filled 2017-11-01: qty 5

## 2017-11-01 NOTE — Telephone Encounter (Signed)
Merleen Nicely RN from infusion called.  Notified her that per Dr Alvy Bimler, pt will not receive IV Iron today but will receive her chemo and Avastin as previously ordered.

## 2017-11-01 NOTE — Patient Instructions (Addendum)
[place] Discharge Instructions for Patients Receiving Chemotherapy  Today you received the following chemotherapy agents Avastin, Taxol and Cisplatin  To help prevent nausea and vomiting after your treatment, we encourage you to take your nausea medication as directed   If you develop nausea and vomiting that is not controlled by your nausea medication, call the clinic.   BELOW ARE SYMPTOMS THAT SHOULD BE REPORTED IMMEDIATELY:  *FEVER GREATER THAN 100.5 F  *CHILLS WITH OR WITHOUT FEVER  NAUSEA AND VOMITING THAT IS NOT CONTROLLED WITH YOUR NAUSEA MEDICATION  *UNUSUAL SHORTNESS OF BREATH  *UNUSUAL BRUISING OR BLEEDING  TENDERNESS IN MOUTH AND THROAT WITH OR WITHOUT PRESENCE OF ULCERS  *URINARY PROBLEMS  *BOWEL PROBLEMS  UNUSUAL RASH Items with * indicate a potential emergency and should be followed up as soon as possible.  Feel free to call the clinic should you have any questions or concerns. The clinic phone number is [phone].  Please show the [place] at check-in to the Emergency Department and triage nurse.

## 2017-11-01 NOTE — Progress Notes (Unsigned)
Per Dr. Alvy Bimler, okay to proceed with treatment with pt urine output being 175 mLs at this time.

## 2017-11-21 ENCOUNTER — Inpatient Hospital Stay: Payer: BC Managed Care – PPO | Attending: Hematology and Oncology | Admitting: Hematology and Oncology

## 2017-11-21 ENCOUNTER — Other Ambulatory Visit: Payer: BC Managed Care – PPO

## 2017-11-21 ENCOUNTER — Encounter: Payer: Self-pay | Admitting: Hematology and Oncology

## 2017-11-21 ENCOUNTER — Ambulatory Visit: Payer: BC Managed Care – PPO | Admitting: Hematology and Oncology

## 2017-11-21 ENCOUNTER — Inpatient Hospital Stay: Payer: BC Managed Care – PPO

## 2017-11-21 ENCOUNTER — Telehealth: Payer: Self-pay | Admitting: Hematology and Oncology

## 2017-11-21 DIAGNOSIS — D500 Iron deficiency anemia secondary to blood loss (chronic): Secondary | ICD-10-CM

## 2017-11-21 DIAGNOSIS — C787 Secondary malignant neoplasm of liver and intrahepatic bile duct: Secondary | ICD-10-CM | POA: Diagnosis not present

## 2017-11-21 DIAGNOSIS — Z79899 Other long term (current) drug therapy: Secondary | ICD-10-CM | POA: Insufficient documentation

## 2017-11-21 DIAGNOSIS — Z7189 Other specified counseling: Secondary | ICD-10-CM

## 2017-11-21 DIAGNOSIS — C530 Malignant neoplasm of endocervix: Secondary | ICD-10-CM | POA: Diagnosis not present

## 2017-11-21 DIAGNOSIS — K1231 Oral mucositis (ulcerative) due to antineoplastic therapy: Secondary | ICD-10-CM | POA: Insufficient documentation

## 2017-11-21 DIAGNOSIS — R030 Elevated blood-pressure reading, without diagnosis of hypertension: Secondary | ICD-10-CM | POA: Insufficient documentation

## 2017-11-21 DIAGNOSIS — Z5111 Encounter for antineoplastic chemotherapy: Secondary | ICD-10-CM | POA: Insufficient documentation

## 2017-11-21 LAB — CMP (CANCER CENTER ONLY)
ALBUMIN: 4.4 g/dL (ref 3.5–5.0)
ALT: 30 U/L (ref 0–55)
AST: 34 U/L (ref 5–34)
Alkaline Phosphatase: 138 U/L (ref 40–150)
Anion gap: 9 (ref 3–11)
BUN: 12 mg/dL (ref 7–26)
CHLORIDE: 107 mmol/L (ref 98–109)
CO2: 25 mmol/L (ref 22–29)
Calcium: 10.1 mg/dL (ref 8.4–10.4)
Creatinine: 0.74 mg/dL (ref 0.60–1.10)
GFR, Est AFR Am: >60 mL/min (ref >60)
GFR, Estimated: >60 mL/min (ref >60)
GLUCOSE: 100 mg/dL (ref 70–140)
POTASSIUM: 3.6 mmol/L (ref 3.5–5.1)
SODIUM: 141 mmol/L (ref 136–145)
TOTAL PROTEIN: 7.8 g/dL (ref 6.4–8.3)
Total Bilirubin: 0.3 mg/dL (ref 0.2–1.2)

## 2017-11-21 LAB — CBC WITH DIFFERENTIAL (CANCER CENTER ONLY)
BASOS ABS: 0 10*3/uL (ref 0.0–0.1)
Basophils Relative: 0 %
EOS PCT: 1 %
Eosinophils Absolute: 0 10*3/uL (ref 0.0–0.5)
HEMATOCRIT: 38.4 % (ref 34.8–46.6)
Hemoglobin: 12.7 g/dL (ref 11.6–15.9)
LYMPHS ABS: 1.3 10*3/uL (ref 0.9–3.3)
LYMPHS PCT: 30 %
MCH: 30.7 pg (ref 25.1–34.0)
MCHC: 33.2 g/dL (ref 31.5–36.0)
MCV: 92.6 fL (ref 79.5–101.0)
MONO ABS: 0.6 10*3/uL (ref 0.1–0.9)
Monocytes Relative: 13 %
NEUTROS ABS: 2.4 10*3/uL (ref 1.5–6.5)
Neutrophils Relative %: 56 %
PLATELETS: 238 10*3/uL (ref 145–400)
RBC: 4.15 MIL/uL (ref 3.70–5.45)
RDW: 21.5 % — ABNORMAL HIGH (ref 11.2–14.5)
WBC Count: 4.4 10*3/uL (ref 3.9–10.3)

## 2017-11-21 LAB — TOTAL PROTEIN, URINE DIPSTICK: PROTEIN: NEGATIVE mg/dL

## 2017-11-21 LAB — MAGNESIUM: Magnesium: 1.8 mg/dL (ref 1.7–2.4)

## 2017-11-21 MED ORDER — HEPARIN SOD (PORK) LOCK FLUSH 100 UNIT/ML IV SOLN
500.0000 [IU] | Freq: Once | INTRAVENOUS | Status: AC
  Administered 2017-11-21: 500 [IU]
  Filled 2017-11-21: qty 5

## 2017-11-21 MED ORDER — SODIUM CHLORIDE 0.9% FLUSH
10.0000 mL | Freq: Once | INTRAVENOUS | Status: AC
  Administered 2017-11-21: 10 mL
  Filled 2017-11-21: qty 10

## 2017-11-21 NOTE — Telephone Encounter (Signed)
Gave avs and calendar ° °

## 2017-11-21 NOTE — Assessment & Plan Note (Signed)
She appears to be tolerating chemotherapy very well without major side effects She continues to have healthy weight gain We will proceed with treatment without dose adjustment I recommend several cycles of chemotherapy before repeat imaging study in July I will continue to certify her disabled due to significant high risk of infection due to her immunocompromise state

## 2017-11-21 NOTE — Assessment & Plan Note (Signed)
She has excellent response to treatment Liver function tests were within normal limits Clinically, the liver mass is no longer palpable I plan to repeat imaging study again in July

## 2017-11-21 NOTE — Assessment & Plan Note (Signed)
She has mild mucositis I recommend conservative management with salt water gargle only

## 2017-11-21 NOTE — Progress Notes (Signed)
[place] OFFICE PROGRESS NOTE  Patient Care Team: Avel Sensor, MD as PCP - General (Obstetrics and Gynecology)  ASSESSMENT & PLAN:  Cervical cancer [facility]) She appears to be tolerating chemotherapy very well without major side effects She continues to have healthy weight gain We will proceed with treatment without dose adjustment I recommend several cycles of chemotherapy before repeat imaging study in July I will continue to certify her disabled due to significant high risk of infection due to her immunocompromise state  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[facility]) She has excellent response to treatment Liver function tests were within normal limits Clinically, the liver mass is no longer palpable I plan to repeat imaging study again in July  Mucositis due to antineoplastic therapy She has mild mucositis I recommend conservative management with salt water gargle only   No orders of the defined types were placed in this encounter.   INTERVAL HISTORY: Please see below for problem oriented charting. She is seen prior to chemotherapy She felt well No recent nausea or vomiting She had very mild trace mucositis that resolved She continues to have healthy weight gain Denies further vaginal bleeding  SUMMARY OF ONCOLOGIC HISTORY:   Cervical cancer ([place])   07/22/2017 Initial Diagnosis    The patient was examined in the wellness center.  Upon vaginal exam, friable mass of tissue presumably from the cervix was interpreted as poorly differentiated high-grade malignant neoplasm.  Follow-up workup with ultrasound of the uterus revealed multiple large uterine fibroids.      07/22/2017 Pathology Results    Biopsy confirmed poorly differentiated high-grade malignant neoplasm.      08/06/2017 Imaging    6.6 cm uterine mass in area of cervix and upper vagina, highly suspicious for cervical carcinoma.  Mild bilateral iliac lymphadenopathy, consistent with metastatic disease. No  abdominal lymphadenopathy identified.  Multiple large liver masses, with areas of central necrosis or scarring, highly suspicious for liver metastases. Although unlikely, it is conceivable that some of these lesions could represent other etiology such as focal nodular hyperplasia or adenomas. Consider further evaluation with PET-CT, abdomen MRI without and with contrast, or percutaneous needle biopsy.  Multiple large uterine fibroids, many showing cystic degeneration.  Bilateral cystic adnexal lesions with probably benign features. Differential diagnosis includes ovarian cysts, endometriomas, and hydrosalpinges. These could also be further assessed on PET-CT or MRI.  No evidence of thoracic metastatic disease.       08/09/2017 Tumor Marker    Patient's tumor was tested for the following markers: CA-125 Results of the tumor marker test revealed 79.2      08/14/2017 Pathology Results    Liver, needle/core biopsy, left hepatic lobe - METASTATIC POORLY DIFFERENTIATED CARCINOMA, SEE COMMENT. Microscopic Comment The tumor is poorly differentiated with abundant necrosis. Pancytokeratin, PAX-8, cytokeratin 7, and p16 are positive. There is weak non-specific staining with TTF-1, synaptophysin (very focal), p63 (very focal). Chromogranin, CD56, S100, desmin, SMA, ER, CD10, cytokeratin 10, cytokeratin 5/6, and cytokeratin 20 are negative. Thus, the immunoprofile is suggest of a gynecologic primary. Dr. Lyndon Code has reviewed the case.       08/21/2017 Procedure    Ultrasound and fluoroscopically guided right internal jugular single lumen power port catheter insertion. Tip in the SVC/RA junction. Catheter ready for use.      08/30/2017 -  Chemotherapy    The patient had received cisplatin and Taxol      10/24/2017 Imaging    Interval resolution of large cervical mass and bilateral iliac lymphadenopathy  since previous study.  Significant decrease in size of diffuse liver metastases since prior  study. No new or progressive metastatic disease identified.  Stable probably benign cystic lesion in right adnexa. Previously seen left adnexal cystic lesion has resolved since prior study.  Stable large uterine fibroids. Increased mild right hydronephrosis due to extrinsic compression of ureter by enlarged uterus.       REVIEW OF SYSTEMS:   Constitutional: Denies fevers, chills or abnormal weight loss Eyes: Denies blurriness of vision Respiratory: Denies cough, dyspnea or wheezes Cardiovascular: Denies palpitation, chest discomfort or lower extremity swelling Gastrointestinal:  Denies nausea, heartburn or change in bowel habits Skin: Denies abnormal skin rashes Lymphatics: Denies new lymphadenopathy or easy bruising Neurological:Denies numbness, tingling or new weaknesses Behavioral/Psych: Mood is stable, no new changes  All other systems were reviewed with the patient and are negative.  I have reviewed the past medical history, past surgical history, social history and family history with the patient and they are unchanged from previous note.  ALLERGIES:  is allergic to benadryl [diphenhydramine].  MEDICATIONS:  Current Outpatient Medications  Medication Sig Dispense Refill  . acetaminophen (TYLENOL) 325 MG tablet Take 650 mg by mouth every 6 (six) hours as needed.    . ALPRAZolam (XANAX) 0.5 MG tablet Take 1 tablet (0.5 mg total) by mouth at bedtime as needed for anxiety. 60 tablet 0  . lidocaine-prilocaine (EMLA) cream Apply to affected area once 30 g 3  . mirtazapine (REMERON) 30 MG tablet Take 1 tablet (30 mg total) by mouth at bedtime. 30 tablet 11  . ondansetron (ZOFRAN) 8 MG tablet Take 1 tablet (8 mg total) by mouth 2 (two) times daily as needed. Start on the third day after chemotherapy. 30 tablet 1  . prochlorperazine (COMPAZINE) 10 MG tablet Take 1 tablet (10 mg total) by mouth every 6 (six) hours as needed for nausea or vomiting. 30 tablet 0   No current  facility-administered medications for this visit.    Facility-Administered Medications Ordered in Other Visits  Medication Dose Route Frequency Provider Last Rate Last Dose  . 0.9 %  sodium chloride infusion   Intravenous Once Alvy Bimler, Kiva Norland, MD      . ferumoxytol (FERAHEME) 510 mg in sodium chloride 0.9 % 100 mL IVPB  510 mg Intravenous Once Alvy Bimler, Hideo Googe, MD      . sodium chloride flush (NS) 0.9 % injection 10 mL  10 mL Intracatheter PRN Alvy Bimler, Keirstyn Aydt, MD   10 mL at 11/01/17 1841    PHYSICAL EXAMINATION: ECOG PERFORMANCE STATUS: 0 - Asymptomatic  Vitals:   11/21/17 1035  BP: 137/81  Pulse: 89  Resp: 18  Temp: 98.4 F (36.9 C)  SpO2: 100%   Filed Weights   11/21/17 1035  Weight: 134 lb 3.2 oz (60.9 kg)    GENERAL:alert, no distress and comfortable SKIN: skin color, texture, turgor are normal, no rashes or significant lesions EYES: normal, Conjunctiva are pink and non-injected, sclera clear OROPHARYNX:no exudate, no erythema and lips, buccal mucosa, and tongue normal  NECK: supple, thyroid normal size, non-tender, without nodularity LYMPH:  no palpable lymphadenopathy in the cervical, axillary or inguinal LUNGS: clear to auscultation and percussion with normal breathing effort HEART: regular rate & rhythm and no murmurs and no lower extremity edema ABDOMEN:abdomen soft, non-tender and normal bowel sounds Musculoskeletal:no cyanosis of digits and no clubbing  NEURO: alert & oriented x 3 with fluent speech, no focal motor/sensory deficits  LABORATORY DATA:  I have reviewed the data as  listed    Component Value Date/Time   NA 141 11/21/2017 1001   K 3.6 11/21/2017 1001   CL 107 11/21/2017 1001   CO2 25 11/21/2017 1001   GLUCOSE 100 11/21/2017 1001   BUN 12 11/21/2017 1001   CREATININE 0.74 11/21/2017 1001   CALCIUM 10.1 11/21/2017 1001   PROT 7.8 11/21/2017 1001   ALBUMIN 4.4 11/21/2017 1001   AST 34 11/21/2017 1001   ALT 30 11/21/2017 1001   ALKPHOS 138 11/21/2017 1001    BILITOT 0.3 11/21/2017 1001   GFRNONAA >60 11/21/2017 1001   GFRAA >60 11/21/2017 1001    No results found for: SPEP, UPEP  Lab Results  Component Value Date   WBC 4.4 11/21/2017   NEUTROABS 2.4 11/21/2017   HGB 12.7 11/21/2017   HCT 38.4 11/21/2017   MCV 92.6 11/21/2017   PLT 238 11/21/2017      Chemistry      Component Value Date/Time   NA 141 11/21/2017 1001   K 3.6 11/21/2017 1001   CL 107 11/21/2017 1001   CO2 25 11/21/2017 1001   BUN 12 11/21/2017 1001   CREATININE 0.74 11/21/2017 1001      Component Value Date/Time   CALCIUM 10.1 11/21/2017 1001   ALKPHOS 138 11/21/2017 1001   AST 34 11/21/2017 1001   ALT 30 11/21/2017 1001   BILITOT 0.3 11/21/2017 1001       RADIOGRAPHIC STUDIES: I have personally reviewed the radiological images as listed and agreed with the findings in the report. Ct Abdomen Pelvis W Contrast  Result Date: 10/24/2017 CLINICAL DATA:  Followup metastatic cervical carcinoma. Ongoing chemotherapy. EXAM: CT ABDOMEN AND PELVIS WITH CONTRAST TECHNIQUE: Multidetector CT imaging of the abdomen and pelvis was performed using the standard protocol following bolus administration of intravenous contrast. CONTRAST:  131mL ISOVUE-300 IOPAMIDOL (ISOVUE-300) INJECTION 61% COMPARISON:  08/06/2017 FINDINGS: Lower Chest: No acute findings. Hepatobiliary: Multiple liver metastases are significantly decreased in size since previous study. Index lesion in segment 3 of the left lobe measures 8.5 x 5.4 cm on image 37/2, compared to 12.2 x 9.6 cm previously. Index lesion in the right hepatic lobe currently measures 6.1 x 4.1 cm on image 26/2, compared to 9.3 x 7.1 cm previously. No new or enlarging liver masses identified. Gallbladder is unremarkable. Pancreas:  No mass or inflammatory changes. Spleen: Within normal limits in size and appearance. Adrenals/Urinary Tract: No masses identified. Mild right hydronephrosis is increased, and appears to be due to extrinsic ureteral  compression by enlarged myomatous uterus. No evidence of left-sided hydronephrosis. Unremarkable unopacified urinary bladder. Stomach/Bowel: No evidence of obstruction, inflammatory process or abnormal fluid collections. Vascular/Lymphatic: Previously seen bilateral iliac lymphadenopathy has resolved since previous study. No new areas of lymphadenopathy identified. Aortic atherosclerosis. Reproductive: Multiple large uterine fibroids are again seen, many of which have peripheral calcification. These remains stable. Previously seen soft tissue mass in the area of the cervix is no longer visualized. A cystic lesion in the right posterior adnexa measures 5.8 x 3.5 cm compared to 5.5 by 3.3 cm previously. Previously seen cystic lesion in the left adnexa has resolved since prior exam. Other:  No evidence of ascites. Musculoskeletal:  No suspicious bone lesions identified. IMPRESSION: Interval resolution of large cervical mass and bilateral iliac lymphadenopathy since previous study. Significant decrease in size of diffuse liver metastases since prior study. No new or progressive metastatic disease identified. Stable probably benign cystic lesion in right adnexa. Previously seen left adnexal cystic lesion has resolved since  prior study. Stable large uterine fibroids. Increased mild right hydronephrosis due to extrinsic compression of ureter by enlarged uterus. Electronically Signed   By: Earle Gell M.D.   On: 10/24/2017 15:22    All questions were answered. The patient knows to call the clinic with any problems, questions or concerns. No barriers to learning was detected.  I spent 15 minutes counseling the patient face to face. The total time spent in the appointment was 20 minutes and more than 50% was on counseling and review of test results  Heath Lark, MD 11/21/2017 11:44 AM

## 2017-11-22 ENCOUNTER — Inpatient Hospital Stay: Payer: BC Managed Care – PPO

## 2017-11-22 ENCOUNTER — Ambulatory Visit: Payer: BC Managed Care – PPO

## 2017-11-22 VITALS — BP 131/88 | HR 76 | Resp 18

## 2017-11-22 DIAGNOSIS — C530 Malignant neoplasm of endocervix: Secondary | ICD-10-CM

## 2017-11-22 DIAGNOSIS — Z7189 Other specified counseling: Secondary | ICD-10-CM

## 2017-11-22 DIAGNOSIS — C787 Secondary malignant neoplasm of liver and intrahepatic bile duct: Secondary | ICD-10-CM

## 2017-11-22 DIAGNOSIS — D500 Iron deficiency anemia secondary to blood loss (chronic): Secondary | ICD-10-CM

## 2017-11-22 MED ORDER — POTASSIUM CHLORIDE 2 MEQ/ML IV SOLN
Freq: Once | INTRAVENOUS | Status: AC
  Administered 2017-11-22: 09:00:00 via INTRAVENOUS
  Filled 2017-11-22: qty 10

## 2017-11-22 MED ORDER — SODIUM CHLORIDE 0.9 % IV SOLN
15.0000 mg/kg | Freq: Once | INTRAVENOUS | Status: AC
  Administered 2017-11-22: 800 mg via INTRAVENOUS
  Filled 2017-11-22: qty 32

## 2017-11-22 MED ORDER — PALONOSETRON HCL INJECTION 0.25 MG/5ML
0.2500 mg | Freq: Once | INTRAVENOUS | Status: AC
  Administered 2017-11-22: 0.25 mg via INTRAVENOUS

## 2017-11-22 MED ORDER — SODIUM CHLORIDE 0.9% FLUSH
10.0000 mL | INTRAVENOUS | Status: DC | PRN
  Filled 2017-11-22: qty 10

## 2017-11-22 MED ORDER — SODIUM CHLORIDE 0.9% FLUSH
10.0000 mL | INTRAVENOUS | Status: DC | PRN
  Administered 2017-11-22: 10 mL
  Filled 2017-11-22: qty 10

## 2017-11-22 MED ORDER — PALONOSETRON HCL INJECTION 0.25 MG/5ML
INTRAVENOUS | Status: AC
  Filled 2017-11-22: qty 5

## 2017-11-22 MED ORDER — SODIUM CHLORIDE 0.9 % IV SOLN
Freq: Once | INTRAVENOUS | Status: AC
  Administered 2017-11-22: 11:00:00 via INTRAVENOUS

## 2017-11-22 MED ORDER — FOSAPREPITANT DIMEGLUMINE INJECTION 150 MG
Freq: Once | INTRAVENOUS | Status: AC
  Administered 2017-11-22: 12:00:00 via INTRAVENOUS
  Filled 2017-11-22: qty 5

## 2017-11-22 MED ORDER — HEPARIN SOD (PORK) LOCK FLUSH 100 UNIT/ML IV SOLN
500.0000 [IU] | Freq: Once | INTRAVENOUS | Status: AC | PRN
  Administered 2017-11-22: 500 [IU]
  Filled 2017-11-22: qty 5

## 2017-11-22 MED ORDER — DEXAMETHASONE SODIUM PHOSPHATE 10 MG/ML IJ SOLN
INTRAMUSCULAR | Status: AC
  Filled 2017-11-22: qty 1

## 2017-11-22 MED ORDER — DIPHENHYDRAMINE HCL 25 MG PO CAPS
ORAL_CAPSULE | ORAL | Status: AC
  Filled 2017-11-22: qty 2

## 2017-11-22 MED ORDER — PACLITAXEL CHEMO INJECTION 300 MG/50ML
135.0000 mg/m2 | Freq: Once | INTRAVENOUS | Status: AC
  Administered 2017-11-22: 210 mg via INTRAVENOUS
  Filled 2017-11-22: qty 35

## 2017-11-22 MED ORDER — FAMOTIDINE IN NACL 20-0.9 MG/50ML-% IV SOLN
20.0000 mg | Freq: Once | INTRAVENOUS | Status: AC
  Administered 2017-11-22: 20 mg via INTRAVENOUS

## 2017-11-22 MED ORDER — ACETAMINOPHEN 325 MG PO TABS
ORAL_TABLET | ORAL | Status: AC
  Filled 2017-11-22: qty 2

## 2017-11-22 MED ORDER — SODIUM CHLORIDE 0.9 % IV SOLN
50.0000 mg/m2 | Freq: Once | INTRAVENOUS | Status: AC
  Administered 2017-11-22: 78 mg via INTRAVENOUS
  Filled 2017-11-22: qty 78

## 2017-11-22 MED ORDER — HEPARIN SOD (PORK) LOCK FLUSH 100 UNIT/ML IV SOLN
500.0000 [IU] | Freq: Once | INTRAVENOUS | Status: DC | PRN
  Filled 2017-11-22: qty 5

## 2017-11-22 MED ORDER — ACETAMINOPHEN 325 MG PO TABS
650.0000 mg | ORAL_TABLET | Freq: Once | ORAL | Status: AC
  Administered 2017-11-22: 650 mg via ORAL

## 2017-11-22 MED ORDER — FAMOTIDINE IN NACL 20-0.9 MG/50ML-% IV SOLN
INTRAVENOUS | Status: AC
  Filled 2017-11-22: qty 50

## 2017-11-22 NOTE — Progress Notes (Signed)
Confirmed Avastin dose w/ Dr. Alvy Bimler; will keep at 800 mg for now.  Kennith Center, Pharm.D., CPP 11/22/2017@8 :26 AM

## 2017-11-22 NOTE — Patient Instructions (Signed)
[place] Discharge Instructions for Patients Receiving Chemotherapy  Today you received the following chemotherapy agents Avastin, Taxol and Cisplatin  To help prevent nausea and vomiting after your treatment, we encourage you to take your nausea medication as directed   If you develop nausea and vomiting that is not controlled by your nausea medication, call the clinic.   BELOW ARE SYMPTOMS THAT SHOULD BE REPORTED IMMEDIATELY:  *FEVER GREATER THAN 100.5 F  *CHILLS WITH OR WITHOUT FEVER  NAUSEA AND VOMITING THAT IS NOT CONTROLLED WITH YOUR NAUSEA MEDICATION  *UNUSUAL SHORTNESS OF BREATH  *UNUSUAL BRUISING OR BLEEDING  TENDERNESS IN MOUTH AND THROAT WITH OR WITHOUT PRESENCE OF ULCERS  *URINARY PROBLEMS  *BOWEL PROBLEMS  UNUSUAL RASH Items with * indicate a potential emergency and should be followed up as soon as possible.  Feel free to call the clinic should you have any questions or concerns. The clinic phone number is [phone].  Please show the [place] at check-in to the Emergency Department and triage nurse.

## 2017-12-12 ENCOUNTER — Inpatient Hospital Stay (HOSPITAL_BASED_OUTPATIENT_CLINIC_OR_DEPARTMENT_OTHER): Payer: BC Managed Care – PPO | Admitting: Hematology and Oncology

## 2017-12-12 ENCOUNTER — Inpatient Hospital Stay: Payer: BC Managed Care – PPO

## 2017-12-12 ENCOUNTER — Encounter: Payer: Self-pay | Admitting: Hematology and Oncology

## 2017-12-12 DIAGNOSIS — Z79899 Other long term (current) drug therapy: Secondary | ICD-10-CM | POA: Diagnosis not present

## 2017-12-12 DIAGNOSIS — C530 Malignant neoplasm of endocervix: Secondary | ICD-10-CM

## 2017-12-12 DIAGNOSIS — Z7189 Other specified counseling: Secondary | ICD-10-CM

## 2017-12-12 DIAGNOSIS — R030 Elevated blood-pressure reading, without diagnosis of hypertension: Secondary | ICD-10-CM | POA: Diagnosis not present

## 2017-12-12 DIAGNOSIS — C787 Secondary malignant neoplasm of liver and intrahepatic bile duct: Secondary | ICD-10-CM

## 2017-12-12 DIAGNOSIS — D500 Iron deficiency anemia secondary to blood loss (chronic): Secondary | ICD-10-CM

## 2017-12-12 LAB — CMP (CANCER CENTER ONLY)
ALBUMIN: 4.1 g/dL (ref 3.5–5.0)
ALT: 27 U/L (ref 0–55)
ANION GAP: 8 (ref 3–11)
AST: 30 U/L (ref 5–34)
Alkaline Phosphatase: 119 U/L (ref 40–150)
BILIRUBIN TOTAL: 0.3 mg/dL (ref 0.2–1.2)
BUN: 11 mg/dL (ref 7–26)
CHLORIDE: 107 mmol/L (ref 98–109)
CO2: 26 mmol/L (ref 22–29)
Calcium: 9.6 mg/dL (ref 8.4–10.4)
Creatinine: 0.76 mg/dL (ref 0.60–1.10)
GFR, Est AFR Am: >60 mL/min (ref >60)
GFR, Estimated: >60 mL/min (ref >60)
Glucose, Bld: 100 mg/dL (ref 70–140)
POTASSIUM: 3.7 mmol/L (ref 3.5–5.1)
Sodium: 141 mmol/L (ref 136–145)
TOTAL PROTEIN: 7.3 g/dL (ref 6.4–8.3)

## 2017-12-12 LAB — CBC WITH DIFFERENTIAL (CANCER CENTER ONLY)
BASOS ABS: 0 10*3/uL (ref 0.0–0.1)
BASOS PCT: 0 %
EOS PCT: 1 %
Eosinophils Absolute: 0 10*3/uL (ref 0.0–0.5)
HCT: 38.1 % (ref 34.8–46.6)
Hemoglobin: 12.7 g/dL (ref 11.6–15.9)
Lymphocytes Relative: 38 %
Lymphs Abs: 1.5 10*3/uL (ref 0.9–3.3)
MCH: 30.9 pg (ref 25.1–34.0)
MCHC: 33.3 g/dL (ref 31.5–36.0)
MCV: 92.7 fL (ref 79.5–101.0)
MONO ABS: 0.5 10*3/uL (ref 0.1–0.9)
Monocytes Relative: 12 %
Neutro Abs: 1.9 10*3/uL (ref 1.5–6.5)
Neutrophils Relative %: 49 %
PLATELETS: 197 10*3/uL (ref 145–400)
RBC: 4.11 MIL/uL (ref 3.70–5.45)
RDW: 16.2 % — AB (ref 11.2–14.5)
WBC Count: 3.8 10*3/uL — ABNORMAL LOW (ref 3.9–10.3)

## 2017-12-12 LAB — TOTAL PROTEIN, URINE DIPSTICK: PROTEIN: NEGATIVE mg/dL

## 2017-12-12 LAB — MAGNESIUM: MAGNESIUM: 1.6 mg/dL — AB (ref 1.7–2.4)

## 2017-12-12 MED ORDER — SODIUM CHLORIDE 0.9% FLUSH
10.0000 mL | Freq: Once | INTRAVENOUS | Status: AC
  Administered 2017-12-12: 10 mL
  Filled 2017-12-12: qty 10

## 2017-12-12 MED ORDER — MAGNESIUM OXIDE 400 (241.3 MG) MG PO TABS: 400.0000 mg | ORAL_TABLET | Freq: Two times a day (BID) | ORAL | 1 refills | Status: DC

## 2017-12-12 MED ORDER — AMLODIPINE BESYLATE 5 MG PO TABS: 5.0000 mg | ORAL_TABLET | Freq: Every day | ORAL | 1 refills | Status: DC

## 2017-12-12 MED ORDER — HEPARIN SOD (PORK) LOCK FLUSH 100 UNIT/ML IV SOLN
500.0000 [IU] | Freq: Once | INTRAVENOUS | Status: AC
  Administered 2017-12-12: 500 [IU]
  Filled 2017-12-12: qty 5

## 2017-12-12 NOTE — Assessment & Plan Note (Signed)
She has excellent response to treatment Liver function tests were within normal limits Clinically, the liver mass is no longer palpable I plan to repeat imaging study again in July

## 2017-12-12 NOTE — Assessment & Plan Note (Signed)
She has mildly reduced magnesium likely due to side effect of cisplatin I recommend magnesium replacement therapy

## 2017-12-12 NOTE — Progress Notes (Signed)
[place] OFFICE PROGRESS NOTE  Patient Care Team: Avel Sensor, MD as PCP - General (Obstetrics and Gynecology)  ASSESSMENT & PLAN:  Cervical cancer [facility]) She appears to be tolerating chemotherapy very well without major side effects She continues to have healthy weight gain We will proceed with treatment without dose adjustment I recommend several cycles of chemotherapy before repeat imaging study in July I will continue to certify her disabled due to significant high risk of infection due to her immunocompromise state  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[facility]) She has excellent response to treatment Liver function tests were within normal limits Clinically, the liver mass is no longer palpable I plan to repeat imaging study again in July  Hypomagnesemia She has mildly reduced magnesium likely due to side effect of cisplatin I recommend magnesium replacement therapy  Elevated BP without diagnosis of hypertension Her blood pressure is mildly elevated could be related to Avastin or anxiety I recommend close blood pressure monitoring at home If her systolic blood pressures greater than 448 or diastolic blood pressure greater than 90, the patient is instructed to call me I gave her prescription of amlodipine to start if needed Her urine protein level was negative   No orders of the defined types were placed in this encounter.   INTERVAL HISTORY: Please see below for problem oriented charting. She returns for chemotherapy She is doing very well She has healthy weight gain She denies recent headaches No recent bleeding Denies peripheral neuropathy No nausea or vomiting Overall, she tolerated treatment very well SUMMARY OF ONCOLOGIC HISTORY:   Cervical cancer ([place])   07/22/2017 Initial Diagnosis    The patient was examined in the wellness center.  Upon vaginal exam, friable mass of tissue presumably from the cervix was interpreted as poorly differentiated  high-grade malignant neoplasm.  Follow-up workup with ultrasound of the uterus revealed multiple large uterine fibroids.      07/22/2017 Pathology Results    Biopsy confirmed poorly differentiated high-grade malignant neoplasm.      08/06/2017 Imaging    6.6 cm uterine mass in area of cervix and upper vagina, highly suspicious for cervical carcinoma.  Mild bilateral iliac lymphadenopathy, consistent with metastatic disease. No abdominal lymphadenopathy identified.  Multiple large liver masses, with areas of central necrosis or scarring, highly suspicious for liver metastases. Although unlikely, it is conceivable that some of these lesions could represent other etiology such as focal nodular hyperplasia or adenomas. Consider further evaluation with PET-CT, abdomen MRI without and with contrast, or percutaneous needle biopsy.  Multiple large uterine fibroids, many showing cystic degeneration.  Bilateral cystic adnexal lesions with probably benign features. Differential diagnosis includes ovarian cysts, endometriomas, and hydrosalpinges. These could also be further assessed on PET-CT or MRI.  No evidence of thoracic metastatic disease.       08/09/2017 Tumor Marker    Patient's tumor was tested for the following markers: CA-125 Results of the tumor marker test revealed 79.2      08/14/2017 Pathology Results    Liver, needle/core biopsy, left hepatic lobe - METASTATIC POORLY DIFFERENTIATED CARCINOMA, SEE COMMENT. Microscopic Comment The tumor is poorly differentiated with abundant necrosis. Pancytokeratin, PAX-8, cytokeratin 7, and p16 are positive. There is weak non-specific staining with TTF-1, synaptophysin (very focal), p63 (very focal). Chromogranin, CD56, S100, desmin, SMA, ER, CD10, cytokeratin 10, cytokeratin 5/6, and cytokeratin 20 are negative. Thus, the immunoprofile is suggest of a gynecologic primary. Dr. Lyndon Code has reviewed the case.  08/21/2017 Procedure     Ultrasound and fluoroscopically guided right internal jugular single lumen power port catheter insertion. Tip in the SVC/RA junction. Catheter ready for use.      08/30/2017 -  Chemotherapy    The patient had received cisplatin and Taxol      10/24/2017 Imaging    Interval resolution of large cervical mass and bilateral iliac lymphadenopathy since previous study.  Significant decrease in size of diffuse liver metastases since prior study. No new or progressive metastatic disease identified.  Stable probably benign cystic lesion in right adnexa. Previously seen left adnexal cystic lesion has resolved since prior study.  Stable large uterine fibroids. Increased mild right hydronephrosis due to extrinsic compression of ureter by enlarged uterus.       REVIEW OF SYSTEMS:   Constitutional: Denies fevers, chills or abnormal weight loss Eyes: Denies blurriness of vision Ears, nose, mouth, throat, and face: Denies mucositis or sore throat Respiratory: Denies cough, dyspnea or wheezes Cardiovascular: Denies palpitation, chest discomfort or lower extremity swelling Gastrointestinal:  Denies nausea, heartburn or change in bowel habits Skin: Denies abnormal skin rashes Lymphatics: Denies new lymphadenopathy or easy bruising Neurological:Denies numbness, tingling or new weaknesses Behavioral/Psych: Mood is stable, no new changes  All other systems were reviewed with the patient and are negative.  I have reviewed the past medical history, past surgical history, social history and family history with the patient and they are unchanged from previous note.  ALLERGIES:  is allergic to benadryl [diphenhydramine].  MEDICATIONS:  Current Outpatient Medications  Medication Sig Dispense Refill  . acetaminophen (TYLENOL) 325 MG tablet Take 650 mg by mouth every 6 (six) hours as needed.    . ALPRAZolam (XANAX) 0.5 MG tablet Take 1 tablet (0.5 mg total) by mouth at bedtime as needed for anxiety. 60 tablet  0  . amLODipine (NORVASC) 5 MG tablet Take 1 tablet (5 mg total) by mouth daily. 30 tablet 1  . lidocaine-prilocaine (EMLA) cream Apply to affected area once 30 g 3  . magnesium oxide (MAG-OX) 400 (241.3 Mg) MG tablet Take 1 tablet (400 mg total) by mouth 2 (two) times daily. 60 tablet 1  . mirtazapine (REMERON) 30 MG tablet Take 1 tablet (30 mg total) by mouth at bedtime. 30 tablet 11  . ondansetron (ZOFRAN) 8 MG tablet Take 1 tablet (8 mg total) by mouth 2 (two) times daily as needed. Start on the third day after chemotherapy. 30 tablet 1  . prochlorperazine (COMPAZINE) 10 MG tablet Take 1 tablet (10 mg total) by mouth every 6 (six) hours as needed for nausea or vomiting. 30 tablet 0   No current facility-administered medications for this visit.    Facility-Administered Medications Ordered in Other Visits  Medication Dose Route Frequency Provider Last Rate Last Dose  . 0.9 %  sodium chloride infusion   Intravenous Once Alvy Bimler, Virga Haltiwanger, MD      . ferumoxytol (FERAHEME) 510 mg in sodium chloride 0.9 % 100 mL IVPB  510 mg Intravenous Once Jamas Jaquay, MD      . sodium chloride flush (NS) 0.9 % injection 10 mL  10 mL Intracatheter PRN Alvy Bimler, Revan Gendron, MD   10 mL at 11/01/17 1841    PHYSICAL EXAMINATION: ECOG PERFORMANCE STATUS: 0 - Asymptomatic  Vitals:   12/12/17 1102  BP: (!) 152/97  Pulse: 75  Resp: 18  Temp: 98.3 F (36.8 C)  SpO2: 100%   Filed Weights   12/12/17 1102  Weight: 142 lb 9.6 oz (64.7  kg)    GENERAL:alert, no distress and comfortable SKIN: skin color, texture, turgor are normal, no rashes or significant lesions EYES: normal, Conjunctiva are pink and non-injected, sclera clear OROPHARYNX:no exudate, no erythema and lips, buccal mucosa, and tongue normal  NECK: supple, thyroid normal size, non-tender, without nodularity LYMPH:  no palpable lymphadenopathy in the cervical, axillary or inguinal LUNGS: clear to auscultation and percussion with normal breathing effort HEART:  regular rate & rhythm and no murmurs and no lower extremity edema ABDOMEN:abdomen soft, non-tender and normal bowel sounds Musculoskeletal:no cyanosis of digits and no clubbing  NEURO: alert & oriented x 3 with fluent speech, no focal motor/sensory deficits  LABORATORY DATA:  I have reviewed the data as listed    Component Value Date/Time   NA 141 12/12/2017 0934   K 3.7 12/12/2017 0934   CL 107 12/12/2017 0934   CO2 26 12/12/2017 0934   GLUCOSE 100 12/12/2017 0934   BUN 11 12/12/2017 0934   CREATININE 0.76 12/12/2017 0934   CALCIUM 9.6 12/12/2017 0934   PROT 7.3 12/12/2017 0934   ALBUMIN 4.1 12/12/2017 0934   AST 30 12/12/2017 0934   ALT 27 12/12/2017 0934   ALKPHOS 119 12/12/2017 0934   BILITOT 0.3 12/12/2017 0934   GFRNONAA >60 12/12/2017 0934   GFRAA >60 12/12/2017 0934    No results found for: SPEP, UPEP  Lab Results  Component Value Date   WBC 3.8 (L) 12/12/2017   NEUTROABS 1.9 12/12/2017   HGB 12.7 12/12/2017   HCT 38.1 12/12/2017   MCV 92.7 12/12/2017   PLT 197 12/12/2017      Chemistry      Component Value Date/Time   NA 141 12/12/2017 0934   K 3.7 12/12/2017 0934   CL 107 12/12/2017 0934   CO2 26 12/12/2017 0934   BUN 11 12/12/2017 0934   CREATININE 0.76 12/12/2017 0934      Component Value Date/Time   CALCIUM 9.6 12/12/2017 0934   ALKPHOS 119 12/12/2017 0934   AST 30 12/12/2017 0934   ALT 27 12/12/2017 0934   BILITOT 0.3 12/12/2017 0934       All questions were answered. The patient knows to call the clinic with any problems, questions or concerns. No barriers to learning was detected.  I spent 15 minutes counseling the patient face to face. The total time spent in the appointment was 20 minutes and more than 50% was on counseling and review of test results  Stephanie Lark, MD 12/12/2017 1:58 PM

## 2017-12-12 NOTE — Assessment & Plan Note (Signed)
She appears to be tolerating chemotherapy very well without major side effects She continues to have healthy weight gain We will proceed with treatment without dose adjustment I recommend several cycles of chemotherapy before repeat imaging study in July I will continue to certify her disabled due to significant high risk of infection due to her immunocompromise state

## 2017-12-12 NOTE — Assessment & Plan Note (Signed)
Her blood pressure is mildly elevated could be related to Avastin or anxiety I recommend close blood pressure monitoring at home If her systolic blood pressures greater than 622 or diastolic blood pressure greater than 90, the patient is instructed to call me I gave her prescription of amlodipine to start if needed Her urine protein level was negative

## 2017-12-13 ENCOUNTER — Inpatient Hospital Stay: Payer: BC Managed Care – PPO

## 2017-12-13 VITALS — BP 141/89 | HR 85 | Temp 97.9°F | Resp 16 | Wt 140.2 lb

## 2017-12-13 DIAGNOSIS — C787 Secondary malignant neoplasm of liver and intrahepatic bile duct: Secondary | ICD-10-CM

## 2017-12-13 DIAGNOSIS — C530 Malignant neoplasm of endocervix: Secondary | ICD-10-CM

## 2017-12-13 DIAGNOSIS — Z7189 Other specified counseling: Secondary | ICD-10-CM

## 2017-12-13 DIAGNOSIS — D500 Iron deficiency anemia secondary to blood loss (chronic): Secondary | ICD-10-CM

## 2017-12-13 MED ORDER — SODIUM CHLORIDE 0.9 % IV SOLN
50.0000 mg/m2 | Freq: Once | INTRAVENOUS | Status: AC
  Administered 2017-12-13: 78 mg via INTRAVENOUS
  Filled 2017-12-13: qty 78

## 2017-12-13 MED ORDER — SODIUM CHLORIDE 0.9 % IV SOLN
135.0000 mg/m2 | Freq: Once | INTRAVENOUS | Status: AC
  Administered 2017-12-13: 210 mg via INTRAVENOUS
  Filled 2017-12-13: qty 35

## 2017-12-13 MED ORDER — PALONOSETRON HCL INJECTION 0.25 MG/5ML
INTRAVENOUS | Status: AC
  Filled 2017-12-13: qty 5

## 2017-12-13 MED ORDER — FAMOTIDINE IN NACL 20-0.9 MG/50ML-% IV SOLN
INTRAVENOUS | Status: AC
  Filled 2017-12-13: qty 50

## 2017-12-13 MED ORDER — ACETAMINOPHEN 325 MG PO TABS
650.0000 mg | ORAL_TABLET | Freq: Once | ORAL | Status: AC
  Administered 2017-12-13: 650 mg via ORAL

## 2017-12-13 MED ORDER — SODIUM CHLORIDE 0.9 % IV SOLN
Freq: Once | INTRAVENOUS | Status: AC
  Administered 2017-12-13: 10:00:00 via INTRAVENOUS
  Filled 2017-12-13: qty 5

## 2017-12-13 MED ORDER — PALONOSETRON HCL INJECTION 0.25 MG/5ML
0.2500 mg | Freq: Once | INTRAVENOUS | Status: AC
  Administered 2017-12-13: 0.25 mg via INTRAVENOUS

## 2017-12-13 MED ORDER — SODIUM CHLORIDE 0.9 % IV SOLN
Freq: Once | INTRAVENOUS | Status: AC
  Administered 2017-12-13: 08:00:00 via INTRAVENOUS

## 2017-12-13 MED ORDER — POTASSIUM CHLORIDE 2 MEQ/ML IV SOLN
Freq: Once | INTRAVENOUS | Status: AC
  Administered 2017-12-13: 08:00:00 via INTRAVENOUS
  Filled 2017-12-13: qty 10

## 2017-12-13 MED ORDER — SODIUM CHLORIDE 0.9% FLUSH
10.0000 mL | INTRAVENOUS | Status: DC | PRN
  Administered 2017-12-13: 10 mL
  Filled 2017-12-13: qty 10

## 2017-12-13 MED ORDER — FAMOTIDINE IN NACL 20-0.9 MG/50ML-% IV SOLN
20.0000 mg | Freq: Once | INTRAVENOUS | Status: AC
  Administered 2017-12-13: 20 mg via INTRAVENOUS

## 2017-12-13 MED ORDER — SODIUM CHLORIDE 0.9 % IV SOLN
800.0000 mg | Freq: Once | INTRAVENOUS | Status: AC
  Administered 2017-12-13: 800 mg via INTRAVENOUS
  Filled 2017-12-13: qty 32

## 2017-12-13 MED ORDER — ACETAMINOPHEN 325 MG PO TABS
ORAL_TABLET | ORAL | Status: AC
  Filled 2017-12-13: qty 2

## 2017-12-13 MED ORDER — HEPARIN SOD (PORK) LOCK FLUSH 100 UNIT/ML IV SOLN
500.0000 [IU] | Freq: Once | INTRAVENOUS | Status: AC | PRN
  Administered 2017-12-13: 500 [IU]
  Filled 2017-12-13: qty 5

## 2017-12-13 NOTE — Patient Instructions (Signed)
[place] Discharge Instructions for Patients Receiving Chemotherapy  Today you received the following chemotherapy agents Avastin, Taxol and Cisplatin  To help prevent nausea and vomiting after your treatment, we encourage you to take your nausea medication as directed   If you develop nausea and vomiting that is not controlled by your nausea medication, call the clinic.   BELOW ARE SYMPTOMS THAT SHOULD BE REPORTED IMMEDIATELY:  *FEVER GREATER THAN 100.5 F  *CHILLS WITH OR WITHOUT FEVER  NAUSEA AND VOMITING THAT IS NOT CONTROLLED WITH YOUR NAUSEA MEDICATION  *UNUSUAL SHORTNESS OF BREATH  *UNUSUAL BRUISING OR BLEEDING  TENDERNESS IN MOUTH AND THROAT WITH OR WITHOUT PRESENCE OF ULCERS  *URINARY PROBLEMS  *BOWEL PROBLEMS  UNUSUAL RASH Items with * indicate a potential emergency and should be followed up as soon as possible.  Feel free to call the clinic should you have any questions or concerns. The clinic phone number is [phone].  Please show the [place] at check-in to the Emergency Department and triage nurse.

## 2017-12-17 ENCOUNTER — Telehealth: Payer: Self-pay | Admitting: *Deleted

## 2017-12-17 NOTE — Telephone Encounter (Signed)
Pls start amlodipine She has prescription already

## 2017-12-17 NOTE — Telephone Encounter (Signed)
Gave patient message- states she started the amlodipine on Friday night. Will continue taking

## 2017-12-17 NOTE — Telephone Encounter (Signed)
Stephanie Fuentes left a message stating her BP has been 140/96 and 150/96 over the weekend.

## 2017-12-27 ENCOUNTER — Telehealth: Payer: Self-pay

## 2017-12-27 NOTE — Telephone Encounter (Signed)
She called and left a message. She needs to have a tooth extraction at the dentist office on Monday. The dental center with Dr. Truman Hayward, office # [phone]. She is calling to see if it would be okay to have the dental extraction.

## 2017-12-27 NOTE — Telephone Encounter (Signed)
Called and given the same message from Dr. Alvy Bimler. Told her the dental center never called me back. She verbalized understanding.

## 2017-12-27 NOTE — Telephone Encounter (Signed)
Called the dental center and left below message. Ask them to call the nurse back at the office.

## 2017-12-27 NOTE — Telephone Encounter (Signed)
She had recent Avastin which may delay wound healing. If she has severe dental abscess and needs to come out, then we have no choice But if not, I would prefer her to wait minimum 6 weeks from last dose of Avastin I will put a hold on Avastin for now

## 2017-12-27 NOTE — Telephone Encounter (Signed)
She called back after I left a message clarify below message. She will have 1 to 2 teeth extracted on Monday if it okay with Dr. Alvy Bimler.

## 2018-01-02 ENCOUNTER — Inpatient Hospital Stay: Payer: BC Managed Care – PPO | Attending: Hematology and Oncology

## 2018-01-02 ENCOUNTER — Encounter: Payer: Self-pay | Admitting: Hematology and Oncology

## 2018-01-02 ENCOUNTER — Telehealth: Payer: Self-pay | Admitting: Hematology and Oncology

## 2018-01-02 ENCOUNTER — Inpatient Hospital Stay: Payer: BC Managed Care – PPO

## 2018-01-02 ENCOUNTER — Inpatient Hospital Stay (HOSPITAL_BASED_OUTPATIENT_CLINIC_OR_DEPARTMENT_OTHER): Payer: BC Managed Care – PPO | Admitting: Hematology and Oncology

## 2018-01-02 VITALS — BP 140/93 | HR 63 | Temp 98.2°F | Resp 18 | Ht 64.0 in | Wt 143.0 lb

## 2018-01-02 DIAGNOSIS — R030 Elevated blood-pressure reading, without diagnosis of hypertension: Secondary | ICD-10-CM | POA: Insufficient documentation

## 2018-01-02 DIAGNOSIS — C787 Secondary malignant neoplasm of liver and intrahepatic bile duct: Secondary | ICD-10-CM | POA: Diagnosis not present

## 2018-01-02 DIAGNOSIS — K029 Dental caries, unspecified: Secondary | ICD-10-CM | POA: Diagnosis not present

## 2018-01-02 DIAGNOSIS — C530 Malignant neoplasm of endocervix: Secondary | ICD-10-CM

## 2018-01-02 DIAGNOSIS — Z5111 Encounter for antineoplastic chemotherapy: Secondary | ICD-10-CM | POA: Diagnosis present

## 2018-01-02 DIAGNOSIS — D500 Iron deficiency anemia secondary to blood loss (chronic): Secondary | ICD-10-CM

## 2018-01-02 DIAGNOSIS — K089 Disorder of teeth and supporting structures, unspecified: Secondary | ICD-10-CM

## 2018-01-02 DIAGNOSIS — T50905A Adverse effect of unspecified drugs, medicaments and biological substances, initial encounter: Secondary | ICD-10-CM

## 2018-01-02 DIAGNOSIS — R635 Abnormal weight gain: Secondary | ICD-10-CM | POA: Insufficient documentation

## 2018-01-02 DIAGNOSIS — Z7189 Other specified counseling: Secondary | ICD-10-CM

## 2018-01-02 LAB — CMP (CANCER CENTER ONLY)
ALT: 27 U/L (ref 0–55)
AST: 31 U/L (ref 5–34)
Albumin: 4.4 g/dL (ref 3.5–5.0)
Alkaline Phosphatase: 111 U/L (ref 40–150)
Anion gap: 8 (ref 3–11)
BUN: 12 mg/dL (ref 7–26)
CHLORIDE: 105 mmol/L (ref 98–109)
CO2: 27 mmol/L (ref 22–29)
Calcium: 9.9 mg/dL (ref 8.4–10.4)
Creatinine: 0.77 mg/dL (ref 0.60–1.10)
GFR, Est AFR Am: >60 mL/min (ref >60)
Glucose, Bld: 96 mg/dL (ref 70–140)
Potassium: 4 mmol/L (ref 3.5–5.1)
Sodium: 140 mmol/L (ref 136–145)
Total Bilirubin: 0.3 mg/dL (ref 0.2–1.2)
Total Protein: 7.6 g/dL (ref 6.4–8.3)

## 2018-01-02 LAB — TOTAL PROTEIN, URINE DIPSTICK: PROTEIN: NEGATIVE mg/dL

## 2018-01-02 LAB — CBC WITH DIFFERENTIAL (CANCER CENTER ONLY)
Basophils Absolute: 0 10*3/uL (ref 0.0–0.1)
Basophils Relative: 0 %
EOS ABS: 0.1 10*3/uL (ref 0.0–0.5)
Eosinophils Relative: 1 %
HCT: 39.7 % (ref 34.8–46.6)
Hemoglobin: 13.4 g/dL (ref 11.6–15.9)
LYMPHS ABS: 1.5 10*3/uL (ref 0.9–3.3)
Lymphocytes Relative: 30 %
MCH: 31.4 pg (ref 25.1–34.0)
MCHC: 33.8 g/dL (ref 31.5–36.0)
MCV: 93 fL (ref 79.5–101.0)
MONO ABS: 0.4 10*3/uL (ref 0.1–0.9)
MONOS PCT: 8 %
Neutro Abs: 3 10*3/uL (ref 1.5–6.5)
Neutrophils Relative %: 61 %
PLATELETS: 221 10*3/uL (ref 145–400)
RBC: 4.27 MIL/uL (ref 3.70–5.45)
RDW: 14.6 % — AB (ref 11.2–14.5)
WBC: 4.9 10*3/uL (ref 3.9–10.3)

## 2018-01-02 LAB — MAGNESIUM: Magnesium: 1.9 mg/dL (ref 1.7–2.4)

## 2018-01-02 MED ORDER — SODIUM CHLORIDE 0.9% FLUSH
10.0000 mL | Freq: Once | INTRAVENOUS | Status: AC
  Administered 2018-01-02: 10 mL
  Filled 2018-01-02: qty 10

## 2018-01-02 MED ORDER — HEPARIN SOD (PORK) LOCK FLUSH 100 UNIT/ML IV SOLN
250.0000 [IU] | Freq: Once | INTRAVENOUS | Status: AC
  Administered 2018-01-02: 250 [IU]
  Filled 2018-01-02: qty 5

## 2018-01-02 MED ORDER — AMLODIPINE BESYLATE 10 MG PO TABS: 10.0000 mg | ORAL_TABLET | Freq: Every day | ORAL | 3 refills | Status: DC

## 2018-01-02 NOTE — Assessment & Plan Note (Signed)
She has recent dental abscess that responded to antibiotic therapy Due to recent exposure to Avastin, I recommend holding off dental extraction until the third week of July A letter is given to the patient  Today I will hold Avastin until August

## 2018-01-02 NOTE — Assessment & Plan Note (Addendum)
Her blood pressure control is improved with the addition of amlodipine A few of the diastolic blood pressure is over 90 I recommend increased dose amlodipine

## 2018-01-02 NOTE — Assessment & Plan Note (Signed)
She appears to be tolerating chemotherapy very well without major side effects She continues to have healthy weight gain We will proceed with treatment without dose adjustment I will hold Avastin in anticipation for dental extraction next month I plan to repeat imaging study in August before we resume Avastin as maintenance treatment I will continue to certify her disabled due to significant high risk of infection due to her immunocompromise state

## 2018-01-02 NOTE — Progress Notes (Signed)
[facility] OFFICE PROGRESS NOTE  Patient Care Team: Avel Sensor, MD as PCP - General (Obstetrics and Gynecology)  ASSESSMENT & PLAN:  Cervical cancer [facility]) She appears to be tolerating chemotherapy very well without major side effects She continues to have healthy weight gain We will proceed with treatment without dose adjustment I will hold Avastin in anticipation for dental extraction next month I plan to repeat imaging study in August before we resume Avastin as maintenance treatment I will continue to certify her disabled due to significant high risk of infection due to her immunocompromise state  Elevated BP without diagnosis of hypertension Her blood pressure control is improved with the addition of amlodipine A few of the diastolic blood pressure is over 90 I recommend increased dose amlodipine  Poor dentition She has recent dental abscess that responded to antibiotic therapy Due to recent exposure to Avastin, I recommend holding off dental extraction until the third week of July A letter is given to the patient  Today I will hold Avastin until August  Weight gain due to medication She has healthy weight gain since she responded to treatment I recommend discontinuation of Remeron.   Orders Placed This Encounter  Procedures  . CT ABDOMEN PELVIS W CONTRAST    Standing Status:   Future    Standing Expiration Date:   01/03/2019    Order Specific Question:   If indicated for the ordered procedure, I authorize the administration of contrast media per Radiology protocol    Answer:   Yes    Order Specific Question:   Preferred imaging location?    Answer:   [facility]    Order Specific Question:   Radiology Contrast Protocol - do NOT remove file path    Answer:   [email or web address]    Order Specific Question:   Is patient pregnant?    Answer:   No    INTERVAL HISTORY: Please see below for problem oriented  charting. She returns with her sister for further follow-up She feels well She denies recent headache from elevated blood pressure or blurriness of vision She developed recent dental abscess and was prescribed antibiotic treatment The plan will be to undergo dental extraction at some point in the future She denies recent fever or chills No peripheral neuropathy Denies abnormal vaginal bleeding  SUMMARY OF ONCOLOGIC HISTORY:   Cervical cancer ([place])   07/22/2017 Initial Diagnosis    The patient was examined in the wellness center.  Upon vaginal exam, friable mass of tissue presumably from the cervix was interpreted as poorly differentiated high-grade malignant neoplasm.  Follow-up workup with ultrasound of the uterus revealed multiple large uterine fibroids.      07/22/2017 Pathology Results    Biopsy confirmed poorly differentiated high-grade malignant neoplasm.      08/06/2017 Imaging    6.6 cm uterine mass in area of cervix and upper vagina, highly suspicious for cervical carcinoma.  Mild bilateral iliac lymphadenopathy, consistent with metastatic disease. No abdominal lymphadenopathy identified.  Multiple large liver masses, with areas of central necrosis or scarring, highly suspicious for liver metastases. Although unlikely, it is conceivable that some of these lesions could represent other etiology such as focal nodular hyperplasia or adenomas. Consider further evaluation with PET-CT, abdomen MRI without and with contrast, or percutaneous needle biopsy.  Multiple large uterine fibroids, many showing cystic degeneration.  Bilateral cystic adnexal lesions with probably benign features. Differential diagnosis includes ovarian cysts, endometriomas, and hydrosalpinges. These could also be further  assessed on PET-CT or MRI.  No evidence of thoracic metastatic disease.       08/09/2017 Tumor Marker    Patient's tumor was tested for the following markers: CA-125 Results of the  tumor marker test revealed 79.2      08/14/2017 Pathology Results    Liver, needle/core biopsy, left hepatic lobe - METASTATIC POORLY DIFFERENTIATED CARCINOMA, SEE COMMENT. Microscopic Comment The tumor is poorly differentiated with abundant necrosis. Pancytokeratin, PAX-8, cytokeratin 7, and p16 are positive. There is weak non-specific staining with TTF-1, synaptophysin (very focal), p63 (very focal). Chromogranin, CD56, S100, desmin, SMA, ER, CD10, cytokeratin 10, cytokeratin 5/6, and cytokeratin 20 are negative. Thus, the immunoprofile is suggest of a gynecologic primary. Dr. Lyndon Code has reviewed the case.       08/21/2017 Procedure    Ultrasound and fluoroscopically guided right internal jugular single lumen power port catheter insertion. Tip in the SVC/RA junction. Catheter ready for use.      08/30/2017 -  Chemotherapy    The patient had received cisplatin and Taxol      10/24/2017 Imaging    Interval resolution of large cervical mass and bilateral iliac lymphadenopathy since previous study.  Significant decrease in size of diffuse liver metastases since prior study. No new or progressive metastatic disease identified.  Stable probably benign cystic lesion in right adnexa. Previously seen left adnexal cystic lesion has resolved since prior study.  Stable large uterine fibroids. Increased mild right hydronephrosis due to extrinsic compression of ureter by enlarged uterus.       REVIEW OF SYSTEMS:   Constitutional: Denies fevers, chills or abnormal weight loss Eyes: Denies blurriness of vision Ears, nose, mouth, throat, and face: Denies mucositis or sore throat Respiratory: Denies cough, dyspnea or wheezes Cardiovascular: Denies palpitation, chest discomfort or lower extremity swelling Gastrointestinal:  Denies nausea, heartburn or change in bowel habits Skin: Denies abnormal skin rashes Lymphatics: Denies new lymphadenopathy or easy bruising Neurological:Denies numbness, tingling  or new weaknesses Behavioral/Psych: Mood is stable, no new changes  All other systems were reviewed with the patient and are negative.  I have reviewed the past medical history, past surgical history, social history and family history with the patient and they are unchanged from previous note.  ALLERGIES:  is allergic to benadryl [diphenhydramine].  MEDICATIONS:  Current Outpatient Medications  Medication Sig Dispense Refill  . acetaminophen (TYLENOL) 325 MG tablet Take 650 mg by mouth every 6 (six) hours as needed.    . ALPRAZolam (XANAX) 0.5 MG tablet Take 1 tablet (0.5 mg total) by mouth at bedtime as needed for anxiety. 60 tablet 0  . amLODipine (NORVASC) 10 MG tablet Take 1 tablet (10 mg total) by mouth daily. 30 tablet 3  . lidocaine-prilocaine (EMLA) cream Apply to affected area once 30 g 3  . magnesium oxide (MAG-OX) 400 (241.3 Mg) MG tablet Take 1 tablet (400 mg total) by mouth 2 (two) times daily. 60 tablet 1  . ondansetron (ZOFRAN) 8 MG tablet Take 1 tablet (8 mg total) by mouth 2 (two) times daily as needed. Start on the third day after chemotherapy. 30 tablet 1  . prochlorperazine (COMPAZINE) 10 MG tablet Take 1 tablet (10 mg total) by mouth every 6 (six) hours as needed for nausea or vomiting. 30 tablet 0   No current facility-administered medications for this visit.    Facility-Administered Medications Ordered in Other Visits  Medication Dose Route Frequency Provider Last Rate Last Dose  . 0.9 %  sodium chloride infusion  Intravenous Once Heath Lark, MD      . ferumoxytol [facility]) 510 mg in sodium chloride 0.9 % 100 mL IVPB  510 mg Intravenous Once Jarmar Rousseau, MD      . sodium chloride flush (NS) 0.9 % injection 10 mL  10 mL Intracatheter PRN Alvy Bimler, Swannie Milius, MD   10 mL at 11/01/17 1841    PHYSICAL EXAMINATION: ECOG PERFORMANCE STATUS: 1 - Symptomatic but completely ambulatory  Vitals:   01/02/18 1048  BP: (!) 140/93  Pulse: 63  Resp: 18  Temp: 98.2 F (36.8 C)   SpO2: 100%   Filed Weights   01/02/18 1048  Weight: 143 lb (64.9 kg)    GENERAL:alert, no distress and comfortable SKIN: skin color, texture, turgor are normal, no rashes or significant lesions EYES: normal, Conjunctiva are pink and non-injected, sclera clear OROPHARYNX:no exudate, no erythema and lips, buccal mucosa, and tongue normal  NECK: supple, thyroid normal size, non-tender, without nodularity LYMPH:  no palpable lymphadenopathy in the cervical, axillary or inguinal LUNGS: clear to auscultation and percussion with normal breathing effort HEART: regular rate & rhythm and no murmurs and no lower extremity edema ABDOMEN:abdomen soft, non-tender and normal bowel sounds Musculoskeletal:no cyanosis of digits and no clubbing  NEURO: alert & oriented x 3 with fluent speech, no focal motor/sensory deficits  LABORATORY DATA:  I have reviewed the data as listed    Component Value Date/Time   NA 140 01/02/2018 1011   K 4.0 01/02/2018 1011   CL 105 01/02/2018 1011   CO2 27 01/02/2018 1011   GLUCOSE 96 01/02/2018 1011   BUN 12 01/02/2018 1011   CREATININE 0.77 01/02/2018 1011   CALCIUM 9.9 01/02/2018 1011   PROT 7.6 01/02/2018 1011   ALBUMIN 4.4 01/02/2018 1011   AST 31 01/02/2018 1011   ALT 27 01/02/2018 1011   ALKPHOS 111 01/02/2018 1011   BILITOT 0.3 01/02/2018 1011   GFRNONAA >60 01/02/2018 1011   GFRAA >60 01/02/2018 1011    No results found for: SPEP, UPEP  Lab Results  Component Value Date   WBC 4.9 01/02/2018   NEUTROABS 3.0 01/02/2018   HGB 13.4 01/02/2018   HCT 39.7 01/02/2018   MCV 93.0 01/02/2018   PLT 221 01/02/2018      Chemistry      Component Value Date/Time   NA 140 01/02/2018 1011   K 4.0 01/02/2018 1011   CL 105 01/02/2018 1011   CO2 27 01/02/2018 1011   BUN 12 01/02/2018 1011   CREATININE 0.77 01/02/2018 1011      Component Value Date/Time   CALCIUM 9.9 01/02/2018 1011   ALKPHOS 111 01/02/2018 1011   AST 31 01/02/2018 1011   ALT 27  01/02/2018 1011   BILITOT 0.3 01/02/2018 1011       All questions were answered. The patient knows to call the clinic with any problems, questions or concerns. No barriers to learning was detected.  I spent 25 minutes counseling the patient face to face. The total time spent in the appointment was 30 minutes and more than 50% was on counseling and review of test results  Heath Lark, MD 01/02/2018 1:22 PM

## 2018-01-02 NOTE — Telephone Encounter (Signed)
Gave patient avs and calendar of upcoming august appointments. °

## 2018-01-02 NOTE — Assessment & Plan Note (Signed)
She has healthy weight gain since she responded to treatment I recommend discontinuation of Remeron.

## 2018-01-03 ENCOUNTER — Inpatient Hospital Stay: Payer: BC Managed Care – PPO

## 2018-01-03 VITALS — BP 117/87 | HR 70 | Temp 98.1°F | Resp 18

## 2018-01-03 DIAGNOSIS — Z7189 Other specified counseling: Secondary | ICD-10-CM

## 2018-01-03 DIAGNOSIS — Z5111 Encounter for antineoplastic chemotherapy: Secondary | ICD-10-CM | POA: Diagnosis not present

## 2018-01-03 DIAGNOSIS — D500 Iron deficiency anemia secondary to blood loss (chronic): Secondary | ICD-10-CM

## 2018-01-03 DIAGNOSIS — C530 Malignant neoplasm of endocervix: Secondary | ICD-10-CM

## 2018-01-03 DIAGNOSIS — C787 Secondary malignant neoplasm of liver and intrahepatic bile duct: Secondary | ICD-10-CM

## 2018-01-03 MED ORDER — HEPARIN SOD (PORK) LOCK FLUSH 100 UNIT/ML IV SOLN
500.0000 [IU] | Freq: Once | INTRAVENOUS | Status: AC | PRN
  Administered 2018-01-03: 500 [IU]
  Filled 2018-01-03: qty 5

## 2018-01-03 MED ORDER — POTASSIUM CHLORIDE 2 MEQ/ML IV SOLN
Freq: Once | INTRAVENOUS | Status: AC
  Administered 2018-01-03: 08:00:00 via INTRAVENOUS
  Filled 2018-01-03: qty 10

## 2018-01-03 MED ORDER — SODIUM CHLORIDE 0.9 % IV SOLN
135.0000 mg/m2 | Freq: Once | INTRAVENOUS | Status: AC
  Administered 2018-01-03: 210 mg via INTRAVENOUS
  Filled 2018-01-03: qty 35

## 2018-01-03 MED ORDER — FAMOTIDINE IN NACL 20-0.9 MG/50ML-% IV SOLN
20.0000 mg | Freq: Once | INTRAVENOUS | Status: AC
  Administered 2018-01-03: 20 mg via INTRAVENOUS

## 2018-01-03 MED ORDER — SODIUM CHLORIDE 0.9% FLUSH
10.0000 mL | INTRAVENOUS | Status: DC | PRN
  Administered 2018-01-03: 10 mL
  Filled 2018-01-03: qty 10

## 2018-01-03 MED ORDER — PALONOSETRON HCL INJECTION 0.25 MG/5ML
INTRAVENOUS | Status: AC
Start: 2018-01-03 — End: ?
  Filled 2018-01-03: qty 5

## 2018-01-03 MED ORDER — SODIUM CHLORIDE 0.9 % IV SOLN
Freq: Once | INTRAVENOUS | Status: AC
  Administered 2018-01-03: 10:00:00 via INTRAVENOUS

## 2018-01-03 MED ORDER — SODIUM CHLORIDE 0.9 % IV SOLN
50.0000 mg/m2 | Freq: Once | INTRAVENOUS | Status: AC
  Administered 2018-01-03: 78 mg via INTRAVENOUS
  Filled 2018-01-03: qty 78

## 2018-01-03 MED ORDER — ACETAMINOPHEN 325 MG PO TABS
ORAL_TABLET | ORAL | Status: AC
  Filled 2018-01-03: qty 2

## 2018-01-03 MED ORDER — FOSAPREPITANT DIMEGLUMINE INJECTION 150 MG
Freq: Once | INTRAVENOUS | Status: AC
  Administered 2018-01-03: 10:00:00 via INTRAVENOUS
  Filled 2018-01-03: qty 5

## 2018-01-03 MED ORDER — ACETAMINOPHEN 325 MG PO TABS
650.0000 mg | ORAL_TABLET | Freq: Once | ORAL | Status: AC
  Administered 2018-01-03: 650 mg via ORAL

## 2018-01-03 MED ORDER — PALONOSETRON HCL INJECTION 0.25 MG/5ML
0.2500 mg | Freq: Once | INTRAVENOUS | Status: AC
  Administered 2018-01-03: 0.25 mg via INTRAVENOUS

## 2018-01-03 MED ORDER — FAMOTIDINE IN NACL 20-0.9 MG/50ML-% IV SOLN
INTRAVENOUS | Status: AC
  Filled 2018-01-03: qty 50

## 2018-01-03 NOTE — Patient Instructions (Signed)
[facility] Discharge Instructions for Patients Receiving Chemotherapy  Today you received the following chemotherapy agents Taxol and Cisplatin  To help prevent nausea and vomiting after your treatment, we encourage you to take your nausea medication as directed   If you develop nausea and vomiting that is not controlled by your nausea medication, call the clinic.   BELOW ARE SYMPTOMS THAT SHOULD BE REPORTED IMMEDIATELY:  *FEVER GREATER THAN 100.5 F  *CHILLS WITH OR WITHOUT FEVER  NAUSEA AND VOMITING THAT IS NOT CONTROLLED WITH YOUR NAUSEA MEDICATION  *UNUSUAL SHORTNESS OF BREATH  *UNUSUAL BRUISING OR BLEEDING  TENDERNESS IN MOUTH AND THROAT WITH OR WITHOUT PRESENCE OF ULCERS  *URINARY PROBLEMS  *BOWEL PROBLEMS  UNUSUAL RASH Items with * indicate a potential emergency and should be followed up as soon as possible.  Feel free to call the clinic should you have any questions or concerns. The clinic phone number is [phone].  Please show the [facility] at check-in to the Emergency Department and triage nurse.

## 2018-01-20 ENCOUNTER — Telehealth: Payer: Self-pay

## 2018-01-20 NOTE — Telephone Encounter (Signed)
Called back. She is asking if it is okay to have a glass of wine on vacation.  Told her that that would be okay since she is not currently getting treatment.

## 2018-01-20 NOTE — Telephone Encounter (Signed)
She called and left a message to call her . Called back and left message to call nurse.

## 2018-01-22 NOTE — Progress Notes (Signed)
FMLA successfully faxed to [facility] at [phone]. Mailed copy to patient address on file.

## 2018-02-03 ENCOUNTER — Telehealth: Payer: Self-pay | Admitting: *Deleted

## 2018-02-03 ENCOUNTER — Other Ambulatory Visit: Payer: Self-pay | Admitting: Hematology and Oncology

## 2018-02-03 MED ORDER — MAGNESIUM OXIDE 400 (241.3 MG) MG PO TABS: 400.0000 mg | ORAL_TABLET | Freq: Two times a day (BID) | ORAL | 6 refills | Status: AC

## 2018-02-03 NOTE — Telephone Encounter (Signed)
DOne

## 2018-02-03 NOTE — Telephone Encounter (Signed)
Received call from pt requesting refill on her Magnesium Oxide.  Message routed to Dr Marlinda Mike RN

## 2018-02-04 ENCOUNTER — Other Ambulatory Visit: Payer: Self-pay | Admitting: Hematology and Oncology

## 2018-02-20 ENCOUNTER — Other Ambulatory Visit: Payer: Self-pay | Admitting: *Deleted

## 2018-02-20 ENCOUNTER — Inpatient Hospital Stay: Payer: BC Managed Care – PPO

## 2018-02-20 ENCOUNTER — Inpatient Hospital Stay: Payer: BC Managed Care – PPO | Attending: Hematology and Oncology

## 2018-02-20 ENCOUNTER — Ambulatory Visit (HOSPITAL_COMMUNITY)
Admission: RE | Admit: 2018-02-20 | Discharge: 2018-02-20 | Disposition: A | Discharge status: Home / Self Care | Payer: BC Managed Care – PPO | Source: Ambulatory Visit | Attending: Hematology and Oncology | Admitting: Hematology and Oncology

## 2018-02-20 DIAGNOSIS — C530 Malignant neoplasm of endocervix: Secondary | ICD-10-CM | POA: Insufficient documentation

## 2018-02-20 DIAGNOSIS — N83201 Unspecified ovarian cyst, right side: Secondary | ICD-10-CM | POA: Diagnosis not present

## 2018-02-20 DIAGNOSIS — C787 Secondary malignant neoplasm of liver and intrahepatic bile duct: Secondary | ICD-10-CM | POA: Diagnosis not present

## 2018-02-20 DIAGNOSIS — D259 Leiomyoma of uterus, unspecified: Secondary | ICD-10-CM | POA: Insufficient documentation

## 2018-02-20 DIAGNOSIS — Z79899 Other long term (current) drug therapy: Secondary | ICD-10-CM | POA: Diagnosis not present

## 2018-02-20 DIAGNOSIS — D500 Iron deficiency anemia secondary to blood loss (chronic): Secondary | ICD-10-CM

## 2018-02-20 DIAGNOSIS — Z5111 Encounter for antineoplastic chemotherapy: Secondary | ICD-10-CM | POA: Insufficient documentation

## 2018-02-20 DIAGNOSIS — Z7189 Other specified counseling: Secondary | ICD-10-CM

## 2018-02-20 LAB — MAGNESIUM: MAGNESIUM: 1.9 mg/dL (ref 1.7–2.4)

## 2018-02-20 LAB — CMP (CANCER CENTER ONLY)
ALBUMIN: 4.6 g/dL (ref 3.5–5.0)
ALT: 19 U/L (ref 0–44)
ANION GAP: 10 (ref 5–15)
AST: 22 U/L (ref 15–41)
Alkaline Phosphatase: 85 U/L (ref 38–126)
BUN: 18 mg/dL (ref 6–20)
CALCIUM: 10 mg/dL (ref 8.9–10.3)
CO2: 29 mmol/L (ref 22–32)
CREATININE: 0.85 mg/dL (ref 0.44–1.00)
Chloride: 104 mmol/L (ref 98–111)
GFR, Est AFR Am: >60 mL/min (ref >60)
GFR, Estimated: >60 mL/min (ref >60)
Glucose, Bld: 96 mg/dL (ref 70–99)
POTASSIUM: 3.8 mmol/L (ref 3.5–5.1)
Sodium: 143 mmol/L (ref 135–145)
Total Bilirubin: 0.6 mg/dL (ref 0.3–1.2)
Total Protein: 7.9 g/dL (ref 6.5–8.1)

## 2018-02-20 LAB — CBC WITH DIFFERENTIAL (CANCER CENTER ONLY)
BASOS PCT: 0 %
Basophils Absolute: 0 10*3/uL (ref 0.0–0.1)
Eosinophils Absolute: 0 10*3/uL (ref 0.0–0.5)
Eosinophils Relative: 1 %
HCT: 39.4 % (ref 34.8–46.6)
HEMOGLOBIN: 13.3 g/dL (ref 11.6–15.9)
LYMPHS ABS: 1.1 10*3/uL (ref 0.9–3.3)
Lymphocytes Relative: 25 %
MCH: 31.9 pg (ref 25.1–34.0)
MCHC: 33.7 g/dL (ref 31.5–36.0)
MCV: 94.6 fL (ref 79.5–101.0)
Monocytes Absolute: 0.3 10*3/uL (ref 0.1–0.9)
Monocytes Relative: 7 %
NEUTROS PCT: 67 %
Neutro Abs: 3.1 10*3/uL (ref 1.5–6.5)
Platelet Count: 179 10*3/uL (ref 145–400)
RBC: 4.16 MIL/uL (ref 3.70–5.45)
RDW: 13.8 % (ref 11.2–14.5)
WBC Count: 4.6 10*3/uL (ref 3.9–10.3)

## 2018-02-20 LAB — TOTAL PROTEIN, URINE DIPSTICK: PROTEIN: NEGATIVE mg/dL

## 2018-02-20 MED ORDER — HEPARIN SOD (PORK) LOCK FLUSH 100 UNIT/ML IV SOLN
500.0000 [IU] | Freq: Once | INTRAVENOUS | Status: AC
  Administered 2018-02-20: 500 [IU] via INTRAVENOUS

## 2018-02-20 MED ORDER — HEPARIN SOD (PORK) LOCK FLUSH 100 UNIT/ML IV SOLN
INTRAVENOUS | Status: AC
  Administered 2018-02-20: 500 [IU] via INTRAVENOUS
  Filled 2018-02-20: qty 5

## 2018-02-20 MED ORDER — SODIUM CHLORIDE 0.9% FLUSH
10.0000 mL | Freq: Once | INTRAVENOUS | Status: AC
  Administered 2018-02-20: 10 mL
  Filled 2018-02-20: qty 10

## 2018-02-20 MED ORDER — IOPAMIDOL (ISOVUE-300) INJECTION 61%
INTRAVENOUS | Status: AC
  Filled 2018-02-20: qty 100

## 2018-02-20 MED ORDER — IOPAMIDOL (ISOVUE-300) INJECTION 61%
100.0000 mL | Freq: Once | INTRAVENOUS | Status: AC | PRN
  Administered 2018-02-20: 100 mL via INTRAVENOUS

## 2018-02-21 ENCOUNTER — Encounter: Payer: Self-pay | Admitting: Hematology and Oncology

## 2018-02-21 ENCOUNTER — Inpatient Hospital Stay (HOSPITAL_BASED_OUTPATIENT_CLINIC_OR_DEPARTMENT_OTHER): Payer: BC Managed Care – PPO | Admitting: Hematology and Oncology

## 2018-02-21 ENCOUNTER — Telehealth: Payer: Self-pay | Admitting: Hematology and Oncology

## 2018-02-21 ENCOUNTER — Inpatient Hospital Stay: Payer: BC Managed Care – PPO

## 2018-02-21 VITALS — BP 122/74 | HR 80

## 2018-02-21 DIAGNOSIS — C530 Malignant neoplasm of endocervix: Secondary | ICD-10-CM

## 2018-02-21 DIAGNOSIS — Z79899 Other long term (current) drug therapy: Secondary | ICD-10-CM | POA: Diagnosis not present

## 2018-02-21 DIAGNOSIS — K089 Disorder of teeth and supporting structures, unspecified: Secondary | ICD-10-CM

## 2018-02-21 DIAGNOSIS — C787 Secondary malignant neoplasm of liver and intrahepatic bile duct: Secondary | ICD-10-CM

## 2018-02-21 DIAGNOSIS — Z7189 Other specified counseling: Secondary | ICD-10-CM

## 2018-02-21 MED ORDER — SODIUM CHLORIDE 0.9% FLUSH
10.0000 mL | INTRAVENOUS | Status: DC | PRN
  Administered 2018-02-21: 10 mL
  Filled 2018-02-21: qty 10

## 2018-02-21 MED ORDER — SODIUM CHLORIDE 0.9 % IV SOLN
Freq: Once | INTRAVENOUS | Status: AC
  Administered 2018-02-21: 10:00:00 via INTRAVENOUS
  Filled 2018-02-21: qty 250

## 2018-02-21 MED ORDER — SODIUM CHLORIDE 0.9 % IV SOLN
800.0000 mg | Freq: Once | INTRAVENOUS | Status: AC
  Administered 2018-02-21: 800 mg via INTRAVENOUS
  Filled 2018-02-21: qty 32

## 2018-02-21 MED ORDER — HEPARIN SOD (PORK) LOCK FLUSH 100 UNIT/ML IV SOLN
500.0000 [IU] | Freq: Once | INTRAVENOUS | Status: AC | PRN
  Administered 2018-02-21: 500 [IU]
  Filled 2018-02-21: qty 5

## 2018-02-21 NOTE — Progress Notes (Signed)
[place] OFFICE PROGRESS NOTE  Patient Care Team: Avel Sensor, MD as PCP - General (Obstetrics and Gynecology)  ASSESSMENT & PLAN:  Cervical cancer [facility]) I have reviewed imaging study which showed continue positive response to treatment We will continue single agent Avastin indefinitely I will get her case discussed at the next GYN oncology tumor board to see interval palliative surgical resection is an option for her or not  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[facility]) She has excellent response to treatment Liver function tests were within normal limits Clinically, the liver mass is no longer palpable  Poor dentition She had recent dental extraction and appears to be healing well We will resume Avastin  Goals of care, counseling/discussion We have extensive discussion about goals of care 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   INTERVAL HISTORY: Please see below for problem oriented charting. 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  Upon vaginal exam, friable mass of tissue presumably from the cervix was interpreted as poorly differentiated high-grade malignant neoplasm.  Follow-up workup with ultrasound of the uterus revealed multiple large uterine fibroids.      07/22/2017 Pathology Results    Biopsy confirmed poorly differentiated high-grade malignant neoplasm.      08/06/2017 Imaging    6.6 cm uterine mass in area of cervix and upper vagina,  highly suspicious for cervical carcinoma.  Mild bilateral iliac lymphadenopathy, consistent with metastatic disease. No abdominal lymphadenopathy identified.  Multiple large liver masses, with areas of central necrosis or scarring, highly suspicious for liver metastases. Although unlikely, it is conceivable that some of these lesions could represent other etiology such as focal nodular hyperplasia or adenomas. Consider further evaluation with PET-CT, abdomen MRI without and with contrast, or percutaneous needle biopsy.  Multiple large uterine fibroids, many showing cystic degeneration.  Bilateral cystic adnexal lesions with probably benign features. Differential diagnosis includes ovarian cysts, endometriomas, and hydrosalpinges. These could also be further assessed on PET-CT or MRI.  No evidence of thoracic metastatic disease.       08/09/2017 Tumor Marker    Patient's tumor was tested for the following markers: CA-125 Results of the tumor marker test revealed 79.2      08/14/2017 Pathology Results    Liver, needle/core biopsy, left hepatic lobe - METASTATIC POORLY DIFFERENTIATED CARCINOMA, SEE COMMENT. Microscopic Comment The tumor is poorly differentiated with abundant necrosis. Pancytokeratin, PAX-8, cytokeratin 7, and p16 are positive. There is weak non-specific staining with TTF-1, synaptophysin (very focal), p63 (very focal). Chromogranin, CD56, S100, desmin, SMA, ER, CD10, cytokeratin 10, cytokeratin 5/6, and cytokeratin 20 are negative. Thus, the immunoprofile is suggest of a gynecologic primary. Dr. Lyndon Code has reviewed the case.       08/21/2017 Procedure    Ultrasound and fluoroscopically guided right internal jugular single lumen power port catheter insertion. Tip in the SVC/RA junction. Catheter ready for use.      08/30/2017 -  Chemotherapy    The patient had received cisplatin and Taxol  10/24/2017 Imaging    Interval resolution of large cervical mass and  bilateral iliac lymphadenopathy since previous study.  Significant decrease in size of diffuse liver metastases since prior study. No new or progressive metastatic disease identified.  Stable probably benign cystic lesion in right adnexa. Previously seen left adnexal cystic lesion has resolved since prior study.  Stable large uterine fibroids. Increased mild right hydronephrosis due to extrinsic compression of ureter by enlarged uterus.      02/20/2018 Imaging    Interval improvement in diffuse liver metastases since prior study. No new or progressive metastatic disease.  Stable large uterine fibroids.  No significant change in tubular cystic lesion in right adnexa, suspicious for hydrosalpinx or endometrioma.       REVIEW OF SYSTEMS:   Constitutional: Denies fevers, chills or abnormal weight loss Eyes: Denies blurriness of vision Ears, nose, mouth, throat, and face: Denies mucositis or sore throat Respiratory: Denies cough, dyspnea or wheezes Cardiovascular: Denies palpitation, chest discomfort or lower extremity swelling Gastrointestinal:  Denies nausea, heartburn or change in bowel habits Skin: Denies abnormal skin rashes Lymphatics: Denies new lymphadenopathy or easy bruising Neurological:Denies numbness, tingling or new weaknesses Behavioral/Psych: Mood is stable, no new changes  All other systems were reviewed with the patient and are negative.  I have reviewed the past medical history, past surgical history, social history and family history with the patient and they are unchanged from previous note.  ALLERGIES:  is allergic to benadryl [diphenhydramine].  MEDICATIONS:  Current Outpatient Medications  Medication Sig Dispense Refill  . acetaminophen (TYLENOL) 325 MG tablet Take 650 mg by mouth every 6 (six) hours as needed.    . ALPRAZolam (XANAX) 0.5 MG tablet Take 1 tablet (0.5 mg total) by mouth at bedtime as needed for anxiety. 60 tablet 0  . amLODipine (NORVASC) 10  MG tablet Take 1 tablet (10 mg total) by mouth daily. 30 tablet 3  . magnesium oxide (MAG-OX) 400 (241.3 Mg) MG tablet Take 1 tablet (400 mg total) by mouth 2 (two) times daily. 60 tablet 6  . prochlorperazine (COMPAZINE) 10 MG tablet Take 1 tablet (10 mg total) by mouth every 6 (six) hours as needed for nausea or vomiting. 30 tablet 0   No current facility-administered medications for this visit.    Facility-Administered Medications Ordered in Other Visits  Medication Dose Route Frequency Provider Last Rate Last Dose  . 0.9 %  sodium chloride infusion   Intravenous Once Alvy Bimler, Festus Pursel, MD      . ferumoxytol (FERAHEME) 510 mg in sodium chloride 0.9 % 100 mL IVPB  510 mg Intravenous Once Ezrie Bunyan, MD      . sodium chloride flush (NS) 0.9 % injection 10 mL  10 mL Intracatheter PRN Alvy Bimler, Serenah Mill, MD   10 mL at 02/21/18 1152    PHYSICAL EXAMINATION: ECOG PERFORMANCE STATUS: 0 - Asymptomatic  Vitals:   02/21/18 0855  BP: 118/74  Pulse: 92  Resp: 18  Temp: (!) 97.5 F (36.4 C)  SpO2: 100%   Filed Weights   02/21/18 0855  Weight: 141 lb 6.4 oz (64.1 kg)    GENERAL:alert, no distress and comfortable SKIN: skin color, texture, turgor are normal, no rashes or significant lesions EYES: normal, Conjunctiva are pink and non-injected, sclera clear OROPHARYNX:no exudate, no erythema and lips, buccal mucosa, and tongue normal  NECK: supple, thyroid normal size, non-tender, without nodularity LYMPH:  no palpable lymphadenopathy in the cervical, axillary or inguinal LUNGS: clear to auscultation and percussion with normal  breathing effort HEART: regular rate & rhythm and no murmurs and no lower extremity edema ABDOMEN:abdomen soft, non-tender and normal bowel sounds Musculoskeletal:no cyanosis of digits and no clubbing  NEURO: alert & oriented x 3 with fluent speech, no focal motor/sensory deficits  LABORATORY DATA:  I have reviewed the data as listed    Component Value Date/Time   NA 143  02/20/2018 0850   K 3.8 02/20/2018 0850   CL 104 02/20/2018 0850   CO2 29 02/20/2018 0850   GLUCOSE 96 02/20/2018 0850   BUN 18 02/20/2018 0850   CREATININE 0.85 02/20/2018 0850   CALCIUM 10.0 02/20/2018 0850   PROT 7.9 02/20/2018 0850   ALBUMIN 4.6 02/20/2018 0850   AST 22 02/20/2018 0850   ALT 19 02/20/2018 0850   ALKPHOS 85 02/20/2018 0850   BILITOT 0.6 02/20/2018 0850   GFRNONAA >60 02/20/2018 0850   GFRAA >60 02/20/2018 0850    No results found for: SPEP, UPEP  Lab Results  Component Value Date   WBC 4.6 02/20/2018   NEUTROABS 3.1 02/20/2018   HGB 13.3 02/20/2018   HCT 39.4 02/20/2018   MCV 94.6 02/20/2018   PLT 179 02/20/2018      Chemistry      Component Value Date/Time   NA 143 02/20/2018 0850   K 3.8 02/20/2018 0850   CL 104 02/20/2018 0850   CO2 29 02/20/2018 0850   BUN 18 02/20/2018 0850   CREATININE 0.85 02/20/2018 0850      Component Value Date/Time   CALCIUM 10.0 02/20/2018 0850   ALKPHOS 85 02/20/2018 0850   AST 22 02/20/2018 0850   ALT 19 02/20/2018 0850   BILITOT 0.6 02/20/2018 0850       RADIOGRAPHIC STUDIES: I have reviewed multiple imaging studies with patient and family I have personally reviewed the radiological images as listed and agreed with the findings in the report. Ct Abdomen Pelvis W Contrast  Result Date: 02/20/2018 CLINICAL DATA:  Followup metastatic cervical carcinoma. Recently completed chemotherapy. Restaging. EXAM: CT ABDOMEN AND PELVIS WITH CONTRAST TECHNIQUE: Multidetector CT imaging of the abdomen and pelvis was performed using the standard protocol following bolus administration of intravenous contrast. CONTRAST:  175mL ISOVUE-300 IOPAMIDOL (ISOVUE-300) INJECTION 61% COMPARISON:  10/24/2017 FINDINGS: Lower Chest: No acute findings. Hepatobiliary: Multiple liver metastases show decreased in size since previous study. Largest index lesion in the left hepatic lobe now measures 6.5 x 4.2 cm compared to 8.5 x 5.4 cm previously.  Index lesion in the right hepatic lobe measures 3.1 x 2.7 cm compared to 6.1 x 4.1 cm previously. No new or enlarging liver metastases are identified. Gallbladder is unremarkable. Pancreas:  No mass or inflammatory changes. Spleen: Within normal limits in size and appearance. Adrenals/Urinary Tract: No masses identified. No evidence of hydronephrosis. Stomach/Bowel: No evidence of obstruction, inflammatory process or abnormal fluid collections. Vascular/Lymphatic: No pathologically enlarged lymph nodes. No abdominal aortic aneurysm. Reproductive: Multiple uterine fibroids, most with peripheral calcification and cystic degeneration are stable, largest measuring 9.5 cm. Tubular cystic lesion in the right adnexa currently measures 7.2 x 2.2 cm compared to 5.8 x 3.5 cm previously. This contains a fluid fluid level, and is suspicious for a hydrosalpinx or endometrioma. No evidence of inflammatory process or abnormal fluid collections. Other:  None. Musculoskeletal:  No suspicious bone lesions identified. IMPRESSION: Interval improvement in diffuse liver metastases since prior study. No new or progressive metastatic disease. Stable large uterine fibroids. No significant change in tubular cystic lesion in right adnexa, suspicious  for hydrosalpinx or endometrioma. Electronically Signed   By: Earle Gell M.D.   On: 02/20/2018 14:12    All questions were answered. The patient knows to call the clinic with any problems, questions or concerns. No barriers to learning was detected.  I spent 25 minutes counseling the patient face to face. The total time spent in the appointment was 30 minutes and more than 50% was on counseling and review of test results  Heath Lark, MD 02/21/2018 2:48 PM

## 2018-02-21 NOTE — Assessment & Plan Note (Signed)
She has excellent response to treatment Liver function tests were within normal limits Clinically, the liver mass is no longer palpable

## 2018-02-21 NOTE — Telephone Encounter (Signed)
Gave patient avs and calendar of upcoming appts.  °

## 2018-02-21 NOTE — Progress Notes (Signed)
02/21/18 @ 1033  Confirmed with Thu RN and Patient dental work was done and completed on January 11, 2018  Henreitta Leber, PharmD

## 2018-02-21 NOTE — Patient Instructions (Signed)
[facility] Discharge Instructions for Patients Receiving Chemotherapy  Today you received the following chemotherapy agents Avastin.   To help prevent nausea and vomiting after your treatment, we encourage you to take your nausea medication as prescribed.    If you develop nausea and vomiting that is not controlled by your nausea medication, call the clinic.   BELOW ARE SYMPTOMS THAT SHOULD BE REPORTED IMMEDIATELY:  *FEVER GREATER THAN 100.5 F  *CHILLS WITH OR WITHOUT FEVER  NAUSEA AND VOMITING THAT IS NOT CONTROLLED WITH YOUR NAUSEA MEDICATION  *UNUSUAL SHORTNESS OF BREATH  *UNUSUAL BRUISING OR BLEEDING  TENDERNESS IN MOUTH AND THROAT WITH OR WITHOUT PRESENCE OF ULCERS  *URINARY PROBLEMS  *BOWEL PROBLEMS  UNUSUAL RASH Items with * indicate a potential emergency and should be followed up as soon as possible.  Feel free to call the clinic should you have any questions or concerns. The clinic phone number is [phone].  Please show the CHEMO ALERT CARD at check-in to the Emergency Department and triage nurse.   

## 2018-02-21 NOTE — Assessment & Plan Note (Signed)
I have reviewed imaging study which showed continue positive response to treatment We will continue single agent Avastin indefinitely I will get her case discussed at the next GYN oncology tumor board to see interval palliative surgical resection is an option for her or not

## 2018-02-21 NOTE — Assessment & Plan Note (Signed)
She had recent dental extraction and appears to be healing well We will resume Avastin

## 2018-02-21 NOTE — Assessment & Plan Note (Signed)
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

## 2018-02-23 ENCOUNTER — Other Ambulatory Visit: Payer: Self-pay | Admitting: Hematology and Oncology

## 2018-02-23 DIAGNOSIS — D500 Iron deficiency anemia secondary to blood loss (chronic): Secondary | ICD-10-CM

## 2018-02-23 DIAGNOSIS — C530 Malignant neoplasm of endocervix: Secondary | ICD-10-CM

## 2018-02-23 DIAGNOSIS — Z7189 Other specified counseling: Secondary | ICD-10-CM

## 2018-02-23 DIAGNOSIS — C787 Secondary malignant neoplasm of liver and intrahepatic bile duct: Secondary | ICD-10-CM

## 2018-03-04 ENCOUNTER — Encounter: Payer: Self-pay | Admitting: *Deleted

## 2018-03-14 ENCOUNTER — Inpatient Hospital Stay: Payer: BC Managed Care – PPO

## 2018-03-14 ENCOUNTER — Other Ambulatory Visit: Payer: Self-pay | Admitting: Hematology and Oncology

## 2018-03-14 VITALS — BP 113/79 | HR 70 | Temp 98.4°F | Resp 16

## 2018-03-14 DIAGNOSIS — C530 Malignant neoplasm of endocervix: Secondary | ICD-10-CM | POA: Diagnosis not present

## 2018-03-14 DIAGNOSIS — C787 Secondary malignant neoplasm of liver and intrahepatic bile duct: Secondary | ICD-10-CM

## 2018-03-14 DIAGNOSIS — D500 Iron deficiency anemia secondary to blood loss (chronic): Secondary | ICD-10-CM

## 2018-03-14 LAB — CBC WITH DIFFERENTIAL (CANCER CENTER ONLY)
BASOS ABS: 0 10*3/uL (ref 0.0–0.1)
BASOS PCT: 1 %
EOS ABS: 0 10*3/uL (ref 0.0–0.5)
Eosinophils Relative: 1 %
HCT: 38.7 % (ref 34.8–46.6)
HEMOGLOBIN: 13.2 g/dL (ref 11.6–15.9)
Lymphocytes Relative: 26 %
Lymphs Abs: 1.1 10*3/uL (ref 0.9–3.3)
MCH: 31.8 pg (ref 25.1–34.0)
MCHC: 34.1 g/dL (ref 31.5–36.0)
MCV: 93.3 fL (ref 79.5–101.0)
Monocytes Absolute: 0.3 10*3/uL (ref 0.1–0.9)
Monocytes Relative: 8 %
NEUTROS PCT: 64 %
Neutro Abs: 2.9 10*3/uL (ref 1.5–6.5)
Platelet Count: 166 10*3/uL (ref 145–400)
RBC: 4.14 MIL/uL (ref 3.70–5.45)
RDW: 13.3 % (ref 11.2–14.5)
WBC: 4.4 10*3/uL (ref 3.9–10.3)

## 2018-03-14 LAB — MAGNESIUM: MAGNESIUM: 1.7 mg/dL (ref 1.7–2.4)

## 2018-03-14 LAB — TOTAL PROTEIN, URINE DIPSTICK: PROTEIN: NEGATIVE mg/dL

## 2018-03-14 MED ORDER — SODIUM CHLORIDE 0.9 % IV SOLN: 800.0000 mg | Freq: Once | INTRAVENOUS | Status: DC

## 2018-03-14 MED ORDER — SODIUM CHLORIDE 0.9 % IV SOLN
Freq: Once | INTRAVENOUS | Status: AC
  Administered 2018-03-14: 09:00:00 via INTRAVENOUS
  Filled 2018-03-14: qty 250

## 2018-03-14 MED ORDER — HEPARIN SOD (PORK) LOCK FLUSH 100 UNIT/ML IV SOLN
500.0000 [IU] | Freq: Once | INTRAVENOUS | Status: AC | PRN
  Administered 2018-03-14: 500 [IU]
  Filled 2018-03-14: qty 5

## 2018-03-14 MED ORDER — SODIUM CHLORIDE 0.9% FLUSH
10.0000 mL | INTRAVENOUS | Status: DC | PRN
  Administered 2018-03-14: 10 mL
  Filled 2018-03-14: qty 10

## 2018-03-14 MED ORDER — SODIUM CHLORIDE 0.9 % IV SOLN
900.0000 mg | Freq: Once | INTRAVENOUS | Status: AC
  Administered 2018-03-14: 900 mg via INTRAVENOUS
  Filled 2018-03-14: qty 32

## 2018-03-14 MED ORDER — SODIUM CHLORIDE 0.9% FLUSH
10.0000 mL | Freq: Once | INTRAVENOUS | Status: AC
  Administered 2018-03-14: 10 mL
  Filled 2018-03-14: qty 10

## 2018-03-14 NOTE — Progress Notes (Signed)
I s/w Dr. Alvy Bimler re: Avastin dose increase.   She dosed Avastin last cycle at 800 mg (13 mg/kg) to be cautious s/p dental work.  Pt is in remission per Dr. Alvy Bimler and pt is gaining more weight.  She would like to increase Avastin dose today to 900 mg (14 mg/kg) and as long as she remains stable, next dose she will consider increasing up to 1000 mg (15 mg/kg).  Kennith Center, Pharm.D., CPP 03/14/2018@9 :20 AM

## 2018-03-14 NOTE — Patient Instructions (Signed)
[facility] Discharge Instructions for Patients Receiving Chemotherapy  Today you received the following chemotherapy agents Avastin.   To help prevent nausea and vomiting after your treatment, we encourage you to take your nausea medication as prescribed.    If you develop nausea and vomiting that is not controlled by your nausea medication, call the clinic.   BELOW ARE SYMPTOMS THAT SHOULD BE REPORTED IMMEDIATELY:  *FEVER GREATER THAN 100.5 F  *CHILLS WITH OR WITHOUT FEVER  NAUSEA AND VOMITING THAT IS NOT CONTROLLED WITH YOUR NAUSEA MEDICATION  *UNUSUAL SHORTNESS OF BREATH  *UNUSUAL BRUISING OR BLEEDING  TENDERNESS IN MOUTH AND THROAT WITH OR WITHOUT PRESENCE OF ULCERS  *URINARY PROBLEMS  *BOWEL PROBLEMS  UNUSUAL RASH Items with * indicate a potential emergency and should be followed up as soon as possible.  Feel free to call the clinic should you have any questions or concerns. The clinic phone number is [phone].  Please show the CHEMO ALERT CARD at check-in to the Emergency Department and triage nurse.   

## 2018-03-17 ENCOUNTER — Encounter: Payer: Self-pay | Admitting: Oncology

## 2018-03-17 DIAGNOSIS — C530 Malignant neoplasm of endocervix: Secondary | ICD-10-CM

## 2018-03-17 NOTE — Addendum Note (Signed)
Addended by: Elmo Putt R on: 03/17/2018 11:39 AM   Modules accepted: Orders

## 2018-03-17 NOTE — Progress Notes (Addendum)
Gynecologic Oncology Multi-Disciplinary Disposition Conference Note  Date of the Conference: 03/17/2018  Patient Name: Stephanie Fuentes  Referring Provider: Primary GYN Oncologist:  Stage/Disposition:  Stage IVB cervical cancer. Disposition is for 6 months of Avastin then 3 months break and then reimaging with consideration for surgery. Genetics referral recommended.   This Multidisciplinary conference took place involving physicians from [place], [place], Radiation Oncology, Pathology, Radiology along with the Gynecologic Oncology Nurse Practitioner and RN.  Comprehensive assessment of the patient's malignancy, staging, need for surgery, chemotherapy, radiation therapy, and need for further testing were reviewed. Supportive measures, both inpatient and following discharge were also discussed. The recommended plan of care is documented. Greater than 35 minutes were spent correlating and coordinating this patient's care.

## 2018-03-18 ENCOUNTER — Telehealth: Payer: Self-pay | Admitting: Hematology and Oncology

## 2018-03-18 NOTE — Telephone Encounter (Signed)
Scheduled appt per 8/26 sch message- pt is aware of appt date and time  

## 2018-04-04 ENCOUNTER — Inpatient Hospital Stay: Payer: BC Managed Care – PPO

## 2018-04-04 ENCOUNTER — Inpatient Hospital Stay: Payer: BC Managed Care – PPO | Attending: Hematology and Oncology

## 2018-04-04 ENCOUNTER — Telehealth: Payer: Self-pay | Admitting: Hematology and Oncology

## 2018-04-04 ENCOUNTER — Encounter: Payer: Self-pay | Admitting: Hematology and Oncology

## 2018-04-04 ENCOUNTER — Inpatient Hospital Stay (HOSPITAL_BASED_OUTPATIENT_CLINIC_OR_DEPARTMENT_OTHER): Payer: BC Managed Care – PPO | Admitting: Hematology and Oncology

## 2018-04-04 VITALS — BP 129/73 | HR 72 | Temp 98.2°F | Resp 18 | Ht 64.0 in | Wt 143.6 lb

## 2018-04-04 VITALS — BP 115/83 | HR 70

## 2018-04-04 DIAGNOSIS — F411 Generalized anxiety disorder: Secondary | ICD-10-CM | POA: Diagnosis not present

## 2018-04-04 DIAGNOSIS — C530 Malignant neoplasm of endocervix: Secondary | ICD-10-CM

## 2018-04-04 DIAGNOSIS — R030 Elevated blood-pressure reading, without diagnosis of hypertension: Secondary | ICD-10-CM | POA: Diagnosis not present

## 2018-04-04 DIAGNOSIS — Z23 Encounter for immunization: Secondary | ICD-10-CM | POA: Diagnosis not present

## 2018-04-04 DIAGNOSIS — Z5111 Encounter for antineoplastic chemotherapy: Secondary | ICD-10-CM | POA: Diagnosis not present

## 2018-04-04 DIAGNOSIS — Z79899 Other long term (current) drug therapy: Secondary | ICD-10-CM

## 2018-04-04 DIAGNOSIS — D500 Iron deficiency anemia secondary to blood loss (chronic): Secondary | ICD-10-CM

## 2018-04-04 DIAGNOSIS — C787 Secondary malignant neoplasm of liver and intrahepatic bile duct: Secondary | ICD-10-CM

## 2018-04-04 LAB — CBC WITH DIFFERENTIAL/PLATELET
Basophils Absolute: 0 10*3/uL (ref 0.0–0.1)
Basophils Relative: 0 %
EOS ABS: 0 10*3/uL (ref 0.0–0.5)
Eosinophils Relative: 1 %
HCT: 39 % (ref 34.8–46.6)
HEMOGLOBIN: 13.3 g/dL (ref 11.6–15.9)
LYMPHS ABS: 1.2 10*3/uL (ref 0.9–3.3)
Lymphocytes Relative: 28 %
MCH: 31.4 pg (ref 25.1–34.0)
MCHC: 34.1 g/dL (ref 31.5–36.0)
MCV: 92 fL (ref 79.5–101.0)
MONOS PCT: 6 %
Monocytes Absolute: 0.2 10*3/uL (ref 0.1–0.9)
NEUTROS ABS: 2.9 10*3/uL (ref 1.5–6.5)
NEUTROS PCT: 65 %
Platelets: 145 10*3/uL (ref 145–400)
RBC: 4.24 MIL/uL (ref 3.70–5.45)
RDW: 12.6 % (ref 11.2–14.5)
WBC: 4.4 10*3/uL (ref 3.9–10.3)

## 2018-04-04 LAB — COMPREHENSIVE METABOLIC PANEL
ALBUMIN: 4.3 g/dL (ref 3.5–5.0)
ALT: 18 U/L (ref 0–44)
ANION GAP: 10 (ref 5–15)
AST: 24 U/L (ref 15–41)
Alkaline Phosphatase: 88 U/L (ref 38–126)
BILIRUBIN TOTAL: 0.5 mg/dL (ref 0.3–1.2)
BUN: 11 mg/dL (ref 6–20)
CHLORIDE: 107 mmol/L (ref 98–111)
CO2: 27 mmol/L (ref 22–32)
Calcium: 9.8 mg/dL (ref 8.9–10.3)
Creatinine, Ser: 0.83 mg/dL (ref 0.44–1.00)
GFR calc Af Amer: >60 mL/min (ref >60)
GFR calc non Af Amer: >60 mL/min (ref >60)
GLUCOSE: 97 mg/dL (ref 70–99)
POTASSIUM: 3.4 mmol/L — AB (ref 3.5–5.1)
SODIUM: 144 mmol/L (ref 135–145)
TOTAL PROTEIN: 7.3 g/dL (ref 6.5–8.1)

## 2018-04-04 LAB — TOTAL PROTEIN, URINE DIPSTICK

## 2018-04-04 MED ORDER — SODIUM CHLORIDE 0.9 % IV SOLN
800.0000 mg | Freq: Once | INTRAVENOUS | Status: AC
  Administered 2018-04-04: 800 mg via INTRAVENOUS
  Filled 2018-04-04: qty 32

## 2018-04-04 MED ORDER — ALPRAZOLAM 0.5 MG PO TABS: 0.5000 mg | ORAL_TABLET | Freq: Two times a day (BID) | ORAL | 0 refills | Status: DC | PRN

## 2018-04-04 MED ORDER — SODIUM CHLORIDE 0.9% FLUSH
10.0000 mL | INTRAVENOUS | Status: DC | PRN
  Administered 2018-04-04: 10 mL
  Filled 2018-04-04: qty 10

## 2018-04-04 MED ORDER — SODIUM CHLORIDE 0.9 % IV SOLN
Freq: Once | INTRAVENOUS | Status: AC
  Administered 2018-04-04: 10:00:00 via INTRAVENOUS
  Filled 2018-04-04: qty 250

## 2018-04-04 MED ORDER — HEPARIN SOD (PORK) LOCK FLUSH 100 UNIT/ML IV SOLN
500.0000 [IU] | Freq: Once | INTRAVENOUS | Status: AC | PRN
  Administered 2018-04-04: 500 [IU]
  Filled 2018-04-04: qty 5

## 2018-04-04 MED ORDER — SERTRALINE HCL 25 MG PO TABS: 25.0000 mg | ORAL_TABLET | Freq: Every day | ORAL | 9 refills | Status: DC

## 2018-04-04 MED ORDER — INFLUENZA VAC SPLIT QUAD 0.5 ML IM SUSY
0.5000 mL | PREFILLED_SYRINGE | Freq: Once | INTRAMUSCULAR | Status: AC
  Administered 2018-04-04: 0.5 mL via INTRAMUSCULAR

## 2018-04-04 MED ORDER — SODIUM CHLORIDE 0.9% FLUSH
10.0000 mL | Freq: Once | INTRAVENOUS | Status: AC
  Administered 2018-04-04: 10 mL
  Filled 2018-04-04: qty 10

## 2018-04-04 MED ORDER — INFLUENZA VAC SPLIT QUAD 0.5 ML IM SUSY
PREFILLED_SYRINGE | INTRAMUSCULAR | Status: AC
  Filled 2018-04-04: qty 0.5

## 2018-04-04 NOTE — Patient Instructions (Addendum)
[facility] Discharge Instructions for Patients Receiving Chemotherapy  Today you received the following chemotherapy agents :  Avastin.  To help prevent nausea and vomiting after your treatment, we encourage you to take your nausea medication as prescribed.   If you develop nausea and vomiting that is not controlled by your nausea medication, call the clinic.   BELOW ARE SYMPTOMS THAT SHOULD BE REPORTED IMMEDIATELY:  *FEVER GREATER THAN 100.5 F  *CHILLS WITH OR WITHOUT FEVER  NAUSEA AND VOMITING THAT IS NOT CONTROLLED WITH YOUR NAUSEA MEDICATION  *UNUSUAL SHORTNESS OF BREATH  *UNUSUAL BRUISING OR BLEEDING  TENDERNESS IN MOUTH AND THROAT WITH OR WITHOUT PRESENCE OF ULCERS  *URINARY PROBLEMS  *BOWEL PROBLEMS  UNUSUAL RASH Items with * indicate a potential emergency and should be followed up as soon as possible.  Feel free to call the clinic should you have any questions or concerns. The clinic phone number is [phone].  Please show the CHEMO ALERT CARD at check-in to the Emergency Department and triage nurse.    Influenza Virus Vaccine (Flucelvax) What is this medicine? INFLUENZA VIRUS VACCINE (in floo EN zuh VAHY ruhs vak SEEN) helps to reduce the risk of getting influenza also known as the flu. The vaccine only helps protect you against some strains of the flu. This medicine may be used for other purposes; ask your health care provider or pharmacist if you have questions. COMMON BRAND NAME(S): FLUCELVAX What should I tell my health care provider before I take this medicine? They need to know if you have any of these conditions: -bleeding disorder like hemophilia -fever or infection -Guillain-Barre syndrome or other neurological problems -immune system problems -infection with the human immunodeficiency virus (HIV) or AIDS -low blood platelet counts -multiple sclerosis -an unusual or allergic reaction to influenza virus vaccine, other medicines,  foods, dyes or preservatives -pregnant or trying to get pregnant -breast-feeding How should I use this medicine? This vaccine is for injection into a muscle. It is given by a health care professional. A copy of Vaccine Information Statements will be given before each vaccination. Read this sheet carefully each time. The sheet may change frequently. Talk to your pediatrician regarding the use of this medicine in children. Special care may be needed. Overdosage: If you think you've taken too much of this medicine contact a poison control center or emergency room at once. Overdosage: If you think you have taken too much of this medicine contact a poison control center or emergency room at once. NOTE: This medicine is only for you. Do not share this medicine with others. What if I miss a dose? This does not apply. What may interact with this medicine? -chemotherapy or radiation therapy -medicines that lower your immune system like etanercept, anakinra, infliximab, and adalimumab -medicines that treat or prevent blood clots like warfarin -phenytoin -steroid medicines like prednisone or cortisone -theophylline -vaccines This list may not describe all possible interactions. Give your health care provider a list of all the medicines, herbs, non-prescription drugs, or dietary supplements you use. Also tell them if you smoke, drink alcohol, or use illegal drugs. Some items may interact with your medicine. What should I watch for while using this medicine? Report any side effects that do not go away within 3 days to your doctor or health care professional. Call your health care provider if any unusual symptoms occur within 6 weeks of receiving this vaccine. You may still catch the flu, but the illness is not usually as bad.   You cannot get the flu from the vaccine. The vaccine will not protect against colds or other illnesses that may cause fever. The vaccine is needed every year. What side effects may I  notice from receiving this medicine? Side effects that you should report to your doctor or health care professional as soon as possible: -allergic reactions like skin rash, itching or hives, swelling of the face, lips, or tongue Side effects that usually do not require medical attention (Report these to your doctor or health care professional if they continue or are bothersome.): -fever -headache -muscle aches and pains -pain, tenderness, redness, or swelling at the injection site -tiredness This list may not describe all possible side effects. Call your doctor for medical advice about side effects. You may report side effects to FDA at 1-800-FDA-1088. Where should I keep my medicine? The vaccine will be given by a health care professional in a clinic, pharmacy, doctor's office, or other health care setting. You will not be given vaccine doses to store at home. NOTE: This sheet is a summary. It may not cover all possible information. If you have questions about this medicine, talk to your doctor, pharmacist, or health care provider.  2018 Elsevier/Gold Standard (2011-06-20 14:06:47)  

## 2018-04-04 NOTE — Assessment & Plan Note (Signed)
She has significant anxiety but denies depression I recommend Zoloft along with Xanax as needed. We discussed the risk and benefits of each treatment and she agreed to proceed.

## 2018-04-04 NOTE — Assessment & Plan Note (Signed)
Blood pressures well controlled with current prescription antihypertensives. We will continue the same

## 2018-04-04 NOTE — Progress Notes (Signed)
[place] OFFICE PROGRESS NOTE  Patient Care Team: Avel Sensor, MD as PCP - General (Obstetrics and Gynecology)  ASSESSMENT & PLAN:  Cervical cancer [facility]) I have reviewed imaging study which showed continue positive response to treatment We will continue single agent Avastin indefinitely We have discussed the case at the GYN oncology tumor board. The plan would be to continue on treatment for minimum 6 months of therapy before consideration for surgery I plan to repeat imaging study again in November. We discussed the importance of preventive care and reviewed the vaccination programs. She does not have any prior allergic reactions to influenza vaccination. She agrees to proceed with influenza vaccination today and we will administer it today at the clinic. I gave her disability paperwork to certify her permanently disabled due to her stage IV metastatic cancer   Elevated BP without diagnosis of hypertension Blood pressures well controlled with current prescription antihypertensives. We will continue the same  Generalized anxiety disorder She has significant anxiety but denies depression I recommend Zoloft along with Xanax as needed. We discussed the risk and benefits of each treatment and she agreed to proceed.   Orders Placed This Encounter  Procedures  . CT ABDOMEN PELVIS W CONTRAST    Standing Status:   Future    Standing Expiration Date:   04/05/2019    Order Specific Question:   If indicated for the ordered procedure, I authorize the administration of contrast media per Radiology protocol    Answer:   Yes    Order Specific Question:   Preferred imaging location?    Answer:   [facility]    Order Specific Question:   Radiology Contrast Protocol - do NOT remove file path    Answer:   [email or web address]    Order Specific Question:   Is patient pregnant?    Answer:   No  . Total Protein, Urine dipstick    Standing  Status:   Standing    Number of Occurrences:   9    Standing Expiration Date:   04/05/2019    INTERVAL HISTORY: Please see below for problem oriented charting. She returns for further follow-up She complained of generalized anxiety She has occasional abdominal discomfort No vaginal bleeding Her blood pressure is well controlled No recent infection, fever or chills.  SUMMARY OF ONCOLOGIC HISTORY:   Cervical cancer ([place])   07/22/2017 Initial Diagnosis    The patient was examined in the wellness center.  Upon vaginal exam, friable mass of tissue presumably from the cervix was interpreted as poorly differentiated high-grade malignant neoplasm.  Follow-up workup with ultrasound of the uterus revealed multiple large uterine fibroids.    07/22/2017 Pathology Results    Biopsy confirmed poorly differentiated high-grade malignant neoplasm.    08/06/2017 Imaging    6.6 cm uterine mass in area of cervix and upper vagina, highly suspicious for cervical carcinoma.  Mild bilateral iliac lymphadenopathy, consistent with metastatic disease. No abdominal lymphadenopathy identified.  Multiple large liver masses, with areas of central necrosis or scarring, highly suspicious for liver metastases. Although unlikely, it is conceivable that some of these lesions could represent other etiology such as focal nodular hyperplasia or adenomas. Consider further evaluation with PET-CT, abdomen MRI without and with contrast, or percutaneous needle biopsy.  Multiple large uterine fibroids, many showing cystic degeneration.  Bilateral cystic adnexal lesions with probably benign features. Differential diagnosis includes ovarian cysts, endometriomas, and hydrosalpinges. These could also be further assessed on PET-CT or MRI.  No evidence of thoracic metastatic disease.     08/09/2017 Tumor Marker    Patient's tumor was tested for the following markers: CA-125 Results of the tumor marker test revealed 79.2     08/14/2017 Pathology Results    Liver, needle/core biopsy, left hepatic lobe - METASTATIC POORLY DIFFERENTIATED CARCINOMA, SEE COMMENT. Microscopic Comment The tumor is poorly differentiated with abundant necrosis. Pancytokeratin, PAX-8, cytokeratin 7, and p16 are positive. There is weak non-specific staining with TTF-1, synaptophysin (very focal), p63 (very focal). Chromogranin, CD56, S100, desmin, SMA, ER, CD10, cytokeratin 10, cytokeratin 5/6, and cytokeratin 20 are negative. Thus, the immunoprofile is suggest of a gynecologic primary. Dr. Lyndon Code has reviewed the case.     08/21/2017 Procedure    Ultrasound and fluoroscopically guided right internal jugular single lumen power port catheter insertion. Tip in the SVC/RA junction. Catheter ready for use.    08/30/2017 -  Chemotherapy    The patient had received cisplatin and Taxol    10/24/2017 Imaging    Interval resolution of large cervical mass and bilateral iliac lymphadenopathy since previous study.  Significant decrease in size of diffuse liver metastases since prior study. No new or progressive metastatic disease identified.  Stable probably benign cystic lesion in right adnexa. Previously seen left adnexal cystic lesion has resolved since prior study.  Stable large uterine fibroids. Increased mild right hydronephrosis due to extrinsic compression of ureter by enlarged uterus.    02/20/2018 Imaging    Interval improvement in diffuse liver metastases since prior study. No new or progressive metastatic disease.  Stable large uterine fibroids.  No significant change in tubular cystic lesion in right adnexa, suspicious for hydrosalpinx or endometrioma.     REVIEW OF SYSTEMS:   Constitutional: Denies fevers, chills or abnormal weight loss Eyes: Denies blurriness of vision Ears, nose, mouth, throat, and face: Denies mucositis or sore throat Respiratory: Denies cough, dyspnea or wheezes Cardiovascular: Denies palpitation, chest  discomfort or lower extremity swelling Gastrointestinal:  Denies nausea, heartburn or change in bowel habits Skin: Denies abnormal skin rashes Lymphatics: Denies new lymphadenopathy or easy bruising Neurological:Denies numbness, tingling or new weaknesses All other systems were reviewed with the patient and are negative.  I have reviewed the past medical history, past surgical history, social history and family history with the patient and they are unchanged from previous note.  ALLERGIES:  is allergic to benadryl [diphenhydramine].  MEDICATIONS:  Current Outpatient Medications  Medication Sig Dispense Refill  . acetaminophen (TYLENOL) 325 MG tablet Take 650 mg by mouth every 6 (six) hours as needed.    . ALPRAZolam (XANAX) 0.5 MG tablet Take 1 tablet (0.5 mg total) by mouth 2 (two) times daily as needed for anxiety. 60 tablet 0  . amLODipine (NORVASC) 10 MG tablet Take 1 tablet (10 mg total) by mouth daily. 30 tablet 3  . magnesium oxide (MAG-OX) 400 (241.3 Mg) MG tablet Take 1 tablet (400 mg total) by mouth 2 (two) times daily. 60 tablet 6  . prochlorperazine (COMPAZINE) 10 MG tablet Take 1 tablet (10 mg total) by mouth every 6 (six) hours as needed for nausea or vomiting. 30 tablet 0  . prochlorperazine (COMPAZINE) 10 MG tablet TAKE 1 TABLET(10 MG) BY MOUTH EVERY 6 HOURS AS NEEDED FOR NAUSEA OR VOMITING 30 tablet 9  . sertraline (ZOLOFT) 25 MG tablet Take 1 tablet (25 mg total) by mouth daily. 30 tablet 9   No current facility-administered medications for this visit.    Facility-Administered Medications Ordered in Other  Visits  Medication Dose Route Frequency Provider Last Rate Last Dose  . 0.9 %  sodium chloride infusion   Intravenous Once Nicholi Ghuman, MD      . bevacizumab (AVASTIN) 800 mg in sodium chloride 0.9 % 100 mL chemo infusion  800 mg Intravenous Once Alvy Bimler, Jalaina Salyers, MD      . ferumoxytol (FERAHEME) 510 mg in sodium chloride 0.9 % 100 mL IVPB  510 mg Intravenous Once Marilouise Densmore,  Yentl Verge, MD      . heparin lock flush 100 unit/mL  500 Units Intracatheter Once PRN Alvy Bimler, Advaith Lamarque, MD      . sodium chloride flush (NS) 0.9 % injection 10 mL  10 mL Intracatheter PRN Alvy Bimler, Ecko Beasley, MD        PHYSICAL EXAMINATION: ECOG PERFORMANCE STATUS: 1 - Symptomatic but completely ambulatory  Vitals:   04/04/18 0925  BP: 129/73  Pulse: 72  Resp: 18  Temp: 98.2 F (36.8 C)  SpO2: 100%   Filed Weights   04/04/18 0925  Weight: 143 lb 9.6 oz (65.1 kg)    GENERAL:alert, no distress and comfortable SKIN: skin color, texture, turgor are normal, no rashes or significant lesions EYES: normal, Conjunctiva are pink and non-injected, sclera clear OROPHARYNX:no exudate, no erythema and lips, buccal mucosa, and tongue normal  NECK: supple, thyroid normal size, non-tender, without nodularity LYMPH:  no palpable lymphadenopathy in the cervical, axillary or inguinal LUNGS: clear to auscultation and percussion with normal breathing effort HEART: regular rate & rhythm and no murmurs and no lower extremity edema ABDOMEN:abdomen soft, non-tender and normal bowel sounds Musculoskeletal:no cyanosis of digits and no clubbing  NEURO: alert & oriented x 3 with fluent speech, no focal motor/sensory deficits  LABORATORY DATA:  I have reviewed the data as listed    Component Value Date/Time   NA 144 04/04/2018 0856   K 3.4 (L) 04/04/2018 0856   CL 107 04/04/2018 0856   CO2 27 04/04/2018 0856   GLUCOSE 97 04/04/2018 0856   BUN 11 04/04/2018 0856   CREATININE 0.83 04/04/2018 0856   CREATININE 0.85 02/20/2018 0850   CALCIUM 9.8 04/04/2018 0856   PROT 7.3 04/04/2018 0856   ALBUMIN 4.3 04/04/2018 0856   AST 24 04/04/2018 0856   AST 22 02/20/2018 0850   ALT 18 04/04/2018 0856   ALT 19 02/20/2018 0850   ALKPHOS 88 04/04/2018 0856   BILITOT 0.5 04/04/2018 0856   BILITOT 0.6 02/20/2018 0850   GFRNONAA >60 04/04/2018 0856   GFRNONAA >60 02/20/2018 0850   GFRAA >60 04/04/2018 0856   GFRAA >60  02/20/2018 0850    No results found for: SPEP, UPEP  Lab Results  Component Value Date   WBC 4.4 04/04/2018   NEUTROABS 2.9 04/04/2018   HGB 13.3 04/04/2018   HCT 39.0 04/04/2018   MCV 92.0 04/04/2018   PLT 145 04/04/2018      Chemistry      Component Value Date/Time   NA 144 04/04/2018 0856   K 3.4 (L) 04/04/2018 0856   CL 107 04/04/2018 0856   CO2 27 04/04/2018 0856   BUN 11 04/04/2018 0856   CREATININE 0.83 04/04/2018 0856   CREATININE 0.85 02/20/2018 0850      Component Value Date/Time   CALCIUM 9.8 04/04/2018 0856   ALKPHOS 88 04/04/2018 0856   AST 24 04/04/2018 0856   AST 22 02/20/2018 0850   ALT 18 04/04/2018 0856   ALT 19 02/20/2018 0850   BILITOT 0.5 04/04/2018 0856   BILITOT 0.6  02/20/2018 0850       All questions were answered. The patient knows to call the clinic with any problems, questions or concerns. No barriers to learning was detected.  I spent 15 minutes counseling the patient face to face. The total time spent in the appointment was 20 minutes and more than 50% was on counseling and review of test results  Heath Lark, MD 04/04/2018 10:14 AM

## 2018-04-04 NOTE — Assessment & Plan Note (Addendum)
I have reviewed imaging study which showed continue positive response to treatment We will continue single agent Avastin indefinitely We have discussed the case at the GYN oncology tumor board. The plan would be to continue on treatment for minimum 6 months of therapy before consideration for surgery I plan to repeat imaging study again in November. We discussed the importance of preventive care and reviewed the vaccination programs. She does not have any prior allergic reactions to influenza vaccination. She agrees to proceed with influenza vaccination today and we will administer it today at the clinic. I gave her disability paperwork to certify her permanently disabled due to her stage IV metastatic cancer

## 2018-04-04 NOTE — Telephone Encounter (Signed)
Gave patient avs, calendar and contrast.  [facility] will cal patient with CT appt.

## 2018-04-09 NOTE — Progress Notes (Signed)
[facility] Counseling Intake Session  The patient presented to session with a depressed mood and matching affect. The patient presented to counseling for a space to talk about her doubts and fears to someone other than a family member.  The patient reported that for the last week, she has felt "in a slump" and "depressed." The patient expressed that these feelings have been prompted by different experiences of grief and loss, in addition to anxiety leading up to [facility] visits. The patient shared that many of her family and friends check in on her wellbeing, but often she struggles to open up about her experiences due to fear of being a burden or being "judged." The patient denied any current suicidal or homicidal ideation. The patient reported that she is currently prescribed Xanax as needed.   The counselor and patient discussed how counseling may be helpful in supporting the patient. The counselor and patient also explored the patient's resources for coping which include journaling, reading, watching comedy shows, latchhook, getting lunch with coworkers, walking, meditating, and participating in [employer] swim classes and art classes.   The counselor and patient have scheduled another session for September 25 at 1 pm.  Doris Cheadle, Counseling Intern [phone]

## 2018-04-11 ENCOUNTER — Telehealth: Payer: Self-pay | Admitting: Hematology and Oncology

## 2018-04-11 NOTE — Telephone Encounter (Signed)
Pt called to cancel appts will call to r/s later.

## 2018-04-14 ENCOUNTER — Inpatient Hospital Stay: Payer: BC Managed Care – PPO

## 2018-04-14 ENCOUNTER — Inpatient Hospital Stay: Payer: BC Managed Care – PPO | Admitting: Genetic Counselor

## 2018-04-24 ENCOUNTER — Other Ambulatory Visit: Payer: Self-pay | Admitting: Hematology and Oncology

## 2018-04-25 ENCOUNTER — Inpatient Hospital Stay: Payer: BC Managed Care – PPO

## 2018-04-25 ENCOUNTER — Inpatient Hospital Stay: Payer: BC Managed Care – PPO | Attending: Hematology and Oncology

## 2018-04-25 VITALS — BP 120/61 | HR 66 | Temp 97.9°F | Resp 16

## 2018-04-25 DIAGNOSIS — Z79899 Other long term (current) drug therapy: Secondary | ICD-10-CM | POA: Insufficient documentation

## 2018-04-25 DIAGNOSIS — C787 Secondary malignant neoplasm of liver and intrahepatic bile duct: Secondary | ICD-10-CM

## 2018-04-25 DIAGNOSIS — C530 Malignant neoplasm of endocervix: Secondary | ICD-10-CM

## 2018-04-25 DIAGNOSIS — Z5111 Encounter for antineoplastic chemotherapy: Secondary | ICD-10-CM | POA: Insufficient documentation

## 2018-04-25 DIAGNOSIS — D500 Iron deficiency anemia secondary to blood loss (chronic): Secondary | ICD-10-CM

## 2018-04-25 LAB — CBC WITH DIFFERENTIAL/PLATELET
Basophils Absolute: 0 10*3/uL (ref 0.0–0.1)
Basophils Relative: 1 %
EOS PCT: 1 %
Eosinophils Absolute: 0 10*3/uL (ref 0.0–0.5)
HCT: 40.8 % (ref 34.8–46.6)
HEMOGLOBIN: 13.8 g/dL (ref 11.6–15.9)
LYMPHS ABS: 1.1 10*3/uL (ref 0.9–3.3)
LYMPHS PCT: 25 %
MCH: 31.2 pg (ref 25.1–34.0)
MCHC: 33.8 g/dL (ref 31.5–36.0)
MCV: 92.1 fL (ref 79.5–101.0)
Monocytes Absolute: 0.3 10*3/uL (ref 0.1–0.9)
Monocytes Relative: 7 %
Neutro Abs: 2.8 10*3/uL (ref 1.5–6.5)
Neutrophils Relative %: 66 %
PLATELETS: 157 10*3/uL (ref 145–400)
RBC: 4.43 MIL/uL (ref 3.70–5.45)
RDW: 12.6 % (ref 11.2–14.5)
WBC: 4.2 10*3/uL (ref 3.9–10.3)

## 2018-04-25 LAB — COMPREHENSIVE METABOLIC PANEL
ALBUMIN: 4.4 g/dL (ref 3.5–5.0)
ALT: 17 U/L (ref 0–44)
AST: 21 U/L (ref 15–41)
Alkaline Phosphatase: 79 U/L (ref 38–126)
Anion gap: 11 (ref 5–15)
BUN: 10 mg/dL (ref 6–20)
CHLORIDE: 106 mmol/L (ref 98–111)
CO2: 26 mmol/L (ref 22–32)
Calcium: 9.7 mg/dL (ref 8.9–10.3)
Creatinine, Ser: 0.77 mg/dL (ref 0.44–1.00)
GFR calc Af Amer: >60 mL/min (ref >60)
GFR calc non Af Amer: >60 mL/min (ref >60)
Glucose, Bld: 101 mg/dL — ABNORMAL HIGH (ref 70–99)
POTASSIUM: 3.6 mmol/L (ref 3.5–5.1)
SODIUM: 143 mmol/L (ref 135–145)
Total Bilirubin: 0.5 mg/dL (ref 0.3–1.2)
Total Protein: 7.7 g/dL (ref 6.5–8.1)

## 2018-04-25 LAB — TOTAL PROTEIN, URINE DIPSTICK: PROTEIN: NEGATIVE mg/dL

## 2018-04-25 MED ORDER — SODIUM CHLORIDE 0.9% FLUSH
10.0000 mL | INTRAVENOUS | Status: DC | PRN
  Administered 2018-04-25: 10 mL
  Filled 2018-04-25: qty 10

## 2018-04-25 MED ORDER — SODIUM CHLORIDE 0.9% FLUSH
10.0000 mL | Freq: Once | INTRAVENOUS | Status: AC
  Administered 2018-04-25: 10 mL
  Filled 2018-04-25: qty 10

## 2018-04-25 MED ORDER — HEPARIN SOD (PORK) LOCK FLUSH 100 UNIT/ML IV SOLN
500.0000 [IU] | Freq: Once | INTRAVENOUS | Status: AC | PRN
  Administered 2018-04-25: 500 [IU]
  Filled 2018-04-25: qty 5

## 2018-04-25 MED ORDER — SODIUM CHLORIDE 0.9 % IV SOLN
15.4000 mg/kg | Freq: Once | INTRAVENOUS | Status: AC
  Administered 2018-04-25: 1000 mg via INTRAVENOUS
  Filled 2018-04-25: qty 32

## 2018-04-25 MED ORDER — SODIUM CHLORIDE 0.9 % IV SOLN
Freq: Once | INTRAVENOUS | Status: AC
  Administered 2018-04-25: 09:00:00 via INTRAVENOUS
  Filled 2018-04-25: qty 250

## 2018-04-25 NOTE — Patient Instructions (Signed)
[facility] Discharge Instructions for Patients Receiving Chemotherapy  Today you received the following chemotherapy agents :  Avastin.  To help prevent nausea and vomiting after your treatment, we encourage you to take your nausea medication as prescribed.   If you develop nausea and vomiting that is not controlled by your nausea medication, call the clinic.   BELOW ARE SYMPTOMS THAT SHOULD BE REPORTED IMMEDIATELY:  *FEVER GREATER THAN 100.5 F  *CHILLS WITH OR WITHOUT FEVER  NAUSEA AND VOMITING THAT IS NOT CONTROLLED WITH YOUR NAUSEA MEDICATION  *UNUSUAL SHORTNESS OF BREATH  *UNUSUAL BRUISING OR BLEEDING  TENDERNESS IN MOUTH AND THROAT WITH OR WITHOUT PRESENCE OF ULCERS  *URINARY PROBLEMS  *BOWEL PROBLEMS  UNUSUAL RASH Items with * indicate a potential emergency and should be followed up as soon as possible.  Feel free to call the clinic should you have any questions or concerns. The clinic phone number is [phone].  Please show the CHEMO ALERT CARD at check-in to the Emergency Department and triage nurse.    Influenza Virus Vaccine (Flucelvax) What is this medicine? INFLUENZA VIRUS VACCINE (in floo EN zuh VAHY ruhs vak SEEN) helps to reduce the risk of getting influenza also known as the flu. The vaccine only helps protect you against some strains of the flu. This medicine may be used for other purposes; ask your health care provider or pharmacist if you have questions. COMMON BRAND NAME(S): FLUCELVAX What should I tell my health care provider before I take this medicine? They need to know if you have any of these conditions: -bleeding disorder like hemophilia -fever or infection -Guillain-Barre syndrome or other neurological problems -immune system problems -infection with the human immunodeficiency virus (HIV) or AIDS -low blood platelet counts -multiple sclerosis -an unusual or allergic reaction to influenza virus vaccine, other medicines,  foods, dyes or preservatives -pregnant or trying to get pregnant -breast-feeding How should I use this medicine? This vaccine is for injection into a muscle. It is given by a health care professional. A copy of Vaccine Information Statements will be given before each vaccination. Read this sheet carefully each time. The sheet may change frequently. Talk to your pediatrician regarding the use of this medicine in children. Special care may be needed. Overdosage: If you think you've taken too much of this medicine contact a poison control center or emergency room at once. Overdosage: If you think you have taken too much of this medicine contact a poison control center or emergency room at once. NOTE: This medicine is only for you. Do not share this medicine with others. What if I miss a dose? This does not apply. What may interact with this medicine? -chemotherapy or radiation therapy -medicines that lower your immune system like etanercept, anakinra, infliximab, and adalimumab -medicines that treat or prevent blood clots like warfarin -phenytoin -steroid medicines like prednisone or cortisone -theophylline -vaccines This list may not describe all possible interactions. Give your health care provider a list of all the medicines, herbs, non-prescription drugs, or dietary supplements you use. Also tell them if you smoke, drink alcohol, or use illegal drugs. Some items may interact with your medicine. What should I watch for while using this medicine? Report any side effects that do not go away within 3 days to your doctor or health care professional. Call your health care provider if any unusual symptoms occur within 6 weeks of receiving this vaccine. You may still catch the flu, but the illness is not usually as bad.   You cannot get the flu from the vaccine. The vaccine will not protect against colds or other illnesses that may cause fever. The vaccine is needed every year. What side effects may I  notice from receiving this medicine? Side effects that you should report to your doctor or health care professional as soon as possible: -allergic reactions like skin rash, itching or hives, swelling of the face, lips, or tongue Side effects that usually do not require medical attention (Report these to your doctor or health care professional if they continue or are bothersome.): -fever -headache -muscle aches and pains -pain, tenderness, redness, or swelling at the injection site -tiredness This list may not describe all possible side effects. Call your doctor for medical advice about side effects. You may report side effects to FDA at 1-800-FDA-1088. Where should I keep my medicine? The vaccine will be given by a health care professional in a clinic, pharmacy, doctor's office, or other health care setting. You will not be given vaccine doses to store at home. NOTE: This sheet is a summary. It may not cover all possible information. If you have questions about this medicine, talk to your doctor, pharmacist, or health care provider.  2018 Elsevier/Gold Standard (2011-06-20 14:06:47)  

## 2018-04-30 ENCOUNTER — Other Ambulatory Visit: Payer: Self-pay | Admitting: Hematology and Oncology

## 2018-05-01 NOTE — Progress Notes (Signed)
Disability successfully faxed to Hiram Comber at [facility] at [phone]. Mailed copy to patient address on file.

## 2018-05-05 ENCOUNTER — Other Ambulatory Visit: Payer: BC Managed Care – PPO

## 2018-05-05 ENCOUNTER — Ambulatory Visit: Payer: BC Managed Care – PPO | Admitting: Hematology and Oncology

## 2018-05-05 ENCOUNTER — Ambulatory Visit: Payer: BC Managed Care – PPO

## 2018-05-16 ENCOUNTER — Inpatient Hospital Stay: Payer: BC Managed Care – PPO

## 2018-05-16 VITALS — BP 123/80 | HR 68 | Temp 97.8°F | Resp 16

## 2018-05-16 DIAGNOSIS — C787 Secondary malignant neoplasm of liver and intrahepatic bile duct: Secondary | ICD-10-CM

## 2018-05-16 DIAGNOSIS — C530 Malignant neoplasm of endocervix: Secondary | ICD-10-CM

## 2018-05-16 DIAGNOSIS — D500 Iron deficiency anemia secondary to blood loss (chronic): Secondary | ICD-10-CM

## 2018-05-16 LAB — COMPREHENSIVE METABOLIC PANEL
ALBUMIN: 4.3 g/dL (ref 3.5–5.0)
ALT: 13 U/L (ref 0–44)
ANION GAP: 11 (ref 5–15)
AST: 18 U/L (ref 15–41)
Alkaline Phosphatase: 85 U/L (ref 38–126)
BUN: 13 mg/dL (ref 6–20)
CHLORIDE: 105 mmol/L (ref 98–111)
CO2: 26 mmol/L (ref 22–32)
Calcium: 9.8 mg/dL (ref 8.9–10.3)
Creatinine, Ser: 0.79 mg/dL (ref 0.44–1.00)
GFR calc Af Amer: >60 mL/min (ref >60)
GFR calc non Af Amer: >60 mL/min (ref >60)
Glucose, Bld: 85 mg/dL (ref 70–99)
Potassium: 3.8 mmol/L (ref 3.5–5.1)
Sodium: 142 mmol/L (ref 135–145)
Total Bilirubin: 0.7 mg/dL (ref 0.3–1.2)
Total Protein: 7.4 g/dL (ref 6.5–8.1)

## 2018-05-16 LAB — CBC WITH DIFFERENTIAL/PLATELET
Abs Immature Granulocytes: 0.01 10*3/uL (ref 0.00–0.07)
Basophils Absolute: 0 10*3/uL (ref 0.0–0.1)
Basophils Relative: 0 %
EOS PCT: 0 %
Eosinophils Absolute: 0 10*3/uL (ref 0.0–0.5)
HEMATOCRIT: 41 % (ref 36.0–46.0)
Hemoglobin: 14 g/dL (ref 12.0–15.0)
Immature Granulocytes: 0 %
LYMPHS PCT: 19 %
Lymphs Abs: 1.3 10*3/uL (ref 0.7–4.0)
MCH: 31 pg (ref 26.0–34.0)
MCHC: 34.1 g/dL (ref 30.0–36.0)
MCV: 90.9 fL (ref 80.0–100.0)
MONO ABS: 0.6 10*3/uL (ref 0.1–1.0)
Monocytes Relative: 8 %
NEUTROS ABS: 5.1 10*3/uL (ref 1.7–7.7)
NEUTROS PCT: 73 %
Platelets: 157 10*3/uL (ref 150–400)
RBC: 4.51 MIL/uL (ref 3.87–5.11)
RDW: 12 % (ref 11.5–15.5)
WBC: 7 10*3/uL (ref 4.0–10.5)
nRBC: 0 % (ref 0.0–0.2)

## 2018-05-16 LAB — TOTAL PROTEIN, URINE DIPSTICK: PROTEIN: NEGATIVE mg/dL

## 2018-05-16 MED ORDER — SODIUM CHLORIDE 0.9 % IV SOLN
15.5000 mg/kg | Freq: Once | INTRAVENOUS | Status: AC
  Administered 2018-05-16: 1000 mg via INTRAVENOUS
  Filled 2018-05-16: qty 24

## 2018-05-16 MED ORDER — SODIUM CHLORIDE 0.9 % IV SOLN
Freq: Once | INTRAVENOUS | Status: AC
  Administered 2018-05-16: 10:00:00 via INTRAVENOUS
  Filled 2018-05-16: qty 250

## 2018-05-16 MED ORDER — SODIUM CHLORIDE 0.9% FLUSH
10.0000 mL | Freq: Once | INTRAVENOUS | Status: AC
  Administered 2018-05-16: 10 mL
  Filled 2018-05-16: qty 10

## 2018-05-16 MED ORDER — SODIUM CHLORIDE 0.9% FLUSH
10.0000 mL | INTRAVENOUS | Status: DC | PRN
  Administered 2018-05-16: 10 mL
  Filled 2018-05-16: qty 10

## 2018-05-16 MED ORDER — HEPARIN SOD (PORK) LOCK FLUSH 100 UNIT/ML IV SOLN
500.0000 [IU] | Freq: Once | INTRAVENOUS | Status: AC | PRN
  Administered 2018-05-16: 500 [IU]
  Filled 2018-05-16: qty 5

## 2018-05-16 NOTE — Patient Instructions (Signed)
[facility] Discharge Instructions for Patients Receiving Chemotherapy  Today you received the following chemotherapy agents :  Avastin.  To help prevent nausea and vomiting after your treatment, we encourage you to take your nausea medication as prescribed.   If you develop nausea and vomiting that is not controlled by your nausea medication, call the clinic.   BELOW ARE SYMPTOMS THAT SHOULD BE REPORTED IMMEDIATELY:  *FEVER GREATER THAN 100.5 F  *CHILLS WITH OR WITHOUT FEVER  NAUSEA AND VOMITING THAT IS NOT CONTROLLED WITH YOUR NAUSEA MEDICATION  *UNUSUAL SHORTNESS OF BREATH  *UNUSUAL BRUISING OR BLEEDING  TENDERNESS IN MOUTH AND THROAT WITH OR WITHOUT PRESENCE OF ULCERS  *URINARY PROBLEMS  *BOWEL PROBLEMS  UNUSUAL RASH Items with * indicate a potential emergency and should be followed up as soon as possible.  Feel free to call the clinic should you have any questions or concerns. The clinic phone number is [phone].  Please show the CHEMO ALERT CARD at check-in to the Emergency Department and triage nurse.    Influenza Virus Vaccine (Flucelvax) What is this medicine? INFLUENZA VIRUS VACCINE (in floo EN zuh VAHY ruhs vak SEEN) helps to reduce the risk of getting influenza also known as the flu. The vaccine only helps protect you against some strains of the flu. This medicine may be used for other purposes; ask your health care provider or pharmacist if you have questions. COMMON BRAND NAME(S): FLUCELVAX What should I tell my health care provider before I take this medicine? They need to know if you have any of these conditions: -bleeding disorder like hemophilia -fever or infection -Guillain-Barre syndrome or other neurological problems -immune system problems -infection with the human immunodeficiency virus (HIV) or AIDS -low blood platelet counts -multiple sclerosis -an unusual or allergic reaction to influenza virus vaccine, other medicines,  foods, dyes or preservatives -pregnant or trying to get pregnant -breast-feeding How should I use this medicine? This vaccine is for injection into a muscle. It is given by a health care professional. A copy of Vaccine Information Statements will be given before each vaccination. Read this sheet carefully each time. The sheet may change frequently. Talk to your pediatrician regarding the use of this medicine in children. Special care may be needed. Overdosage: If you think you've taken too much of this medicine contact a poison control center or emergency room at once. Overdosage: If you think you have taken too much of this medicine contact a poison control center or emergency room at once. NOTE: This medicine is only for you. Do not share this medicine with others. What if I miss a dose? This does not apply. What may interact with this medicine? -chemotherapy or radiation therapy -medicines that lower your immune system like etanercept, anakinra, infliximab, and adalimumab -medicines that treat or prevent blood clots like warfarin -phenytoin -steroid medicines like prednisone or cortisone -theophylline -vaccines This list may not describe all possible interactions. Give your health care provider a list of all the medicines, herbs, non-prescription drugs, or dietary supplements you use. Also tell them if you smoke, drink alcohol, or use illegal drugs. Some items may interact with your medicine. What should I watch for while using this medicine? Report any side effects that do not go away within 3 days to your doctor or health care professional. Call your health care provider if any unusual symptoms occur within 6 weeks of receiving this vaccine. You may still catch the flu, but the illness is not usually as bad.   You cannot get the flu from the vaccine. The vaccine will not protect against colds or other illnesses that may cause fever. The vaccine is needed every year. What side effects may I  notice from receiving this medicine? Side effects that you should report to your doctor or health care professional as soon as possible: -allergic reactions like skin rash, itching or hives, swelling of the face, lips, or tongue Side effects that usually do not require medical attention (Report these to your doctor or health care professional if they continue or are bothersome.): -fever -headache -muscle aches and pains -pain, tenderness, redness, or swelling at the injection site -tiredness This list may not describe all possible side effects. Call your doctor for medical advice about side effects. You may report side effects to FDA at 1-800-FDA-1088. Where should I keep my medicine? The vaccine will be given by a health care professional in a clinic, pharmacy, doctor's office, or other health care setting. You will not be given vaccine doses to store at home. NOTE: This sheet is a summary. It may not cover all possible information. If you have questions about this medicine, talk to your doctor, pharmacist, or health care provider.  2018 Elsevier/Gold Standard (2011-06-20 14:06:47)  

## 2018-05-30 ENCOUNTER — Telehealth: Payer: Self-pay | Admitting: Hematology

## 2018-05-30 NOTE — Telephone Encounter (Signed)
NG out 10/30 to 12/2 - moved 11/15 f/u to [facility] and adjusted tx. Spoke with patient.

## 2018-06-05 ENCOUNTER — Encounter (HOSPITAL_COMMUNITY): Payer: Self-pay

## 2018-06-05 ENCOUNTER — Inpatient Hospital Stay: Payer: BC Managed Care – PPO

## 2018-06-05 ENCOUNTER — Ambulatory Visit (HOSPITAL_COMMUNITY)
Admission: RE | Admit: 2018-06-05 | Discharge: 2018-06-05 | Disposition: A | Discharge status: Home / Self Care | Payer: BC Managed Care – PPO | Source: Ambulatory Visit | Attending: Hematology and Oncology | Admitting: Hematology and Oncology

## 2018-06-05 ENCOUNTER — Inpatient Hospital Stay: Payer: BC Managed Care – PPO | Attending: Hematology and Oncology

## 2018-06-05 DIAGNOSIS — C539 Malignant neoplasm of cervix uteri, unspecified: Secondary | ICD-10-CM | POA: Insufficient documentation

## 2018-06-05 DIAGNOSIS — C787 Secondary malignant neoplasm of liver and intrahepatic bile duct: Secondary | ICD-10-CM | POA: Insufficient documentation

## 2018-06-05 DIAGNOSIS — Z79899 Other long term (current) drug therapy: Secondary | ICD-10-CM | POA: Diagnosis not present

## 2018-06-05 DIAGNOSIS — R591 Generalized enlarged lymph nodes: Secondary | ICD-10-CM | POA: Insufficient documentation

## 2018-06-05 DIAGNOSIS — D500 Iron deficiency anemia secondary to blood loss (chronic): Secondary | ICD-10-CM

## 2018-06-05 DIAGNOSIS — Z5111 Encounter for antineoplastic chemotherapy: Secondary | ICD-10-CM | POA: Diagnosis not present

## 2018-06-05 DIAGNOSIS — R030 Elevated blood-pressure reading, without diagnosis of hypertension: Secondary | ICD-10-CM | POA: Insufficient documentation

## 2018-06-05 DIAGNOSIS — F411 Generalized anxiety disorder: Secondary | ICD-10-CM | POA: Insufficient documentation

## 2018-06-05 DIAGNOSIS — D259 Leiomyoma of uterus, unspecified: Secondary | ICD-10-CM | POA: Diagnosis not present

## 2018-06-05 DIAGNOSIS — C530 Malignant neoplasm of endocervix: Secondary | ICD-10-CM | POA: Insufficient documentation

## 2018-06-05 LAB — CBC WITH DIFFERENTIAL/PLATELET
ABS IMMATURE GRANULOCYTES: 0.01 10*3/uL (ref 0.00–0.07)
BASOS PCT: 0 %
Basophils Absolute: 0 10*3/uL (ref 0.0–0.1)
Eosinophils Absolute: 0 10*3/uL (ref 0.0–0.5)
Eosinophils Relative: 0 %
HCT: 41.6 % (ref 36.0–46.0)
HEMOGLOBIN: 13.8 g/dL (ref 12.0–15.0)
IMMATURE GRANULOCYTES: 0 %
LYMPHS PCT: 21 %
Lymphs Abs: 1.2 10*3/uL (ref 0.7–4.0)
MCH: 30 pg (ref 26.0–34.0)
MCHC: 33.2 g/dL (ref 30.0–36.0)
MCV: 90.4 fL (ref 80.0–100.0)
MONOS PCT: 9 %
Monocytes Absolute: 0.5 10*3/uL (ref 0.1–1.0)
NEUTROS ABS: 3.9 10*3/uL (ref 1.7–7.7)
NEUTROS PCT: 70 %
PLATELETS: 161 10*3/uL (ref 150–400)
RBC: 4.6 MIL/uL (ref 3.87–5.11)
RDW: 12.3 % (ref 11.5–15.5)
WBC: 5.6 10*3/uL (ref 4.0–10.5)
nRBC: 0 % (ref 0.0–0.2)

## 2018-06-05 LAB — COMPREHENSIVE METABOLIC PANEL
ALT: 11 U/L (ref 0–44)
AST: 21 U/L (ref 15–41)
Albumin: 4.2 g/dL (ref 3.5–5.0)
Alkaline Phosphatase: 88 U/L (ref 38–126)
Anion gap: 10 (ref 5–15)
BUN: 8 mg/dL (ref 6–20)
CHLORIDE: 107 mmol/L (ref 98–111)
CO2: 27 mmol/L (ref 22–32)
CREATININE: 0.77 mg/dL (ref 0.44–1.00)
Calcium: 9.7 mg/dL (ref 8.9–10.3)
GFR calc Af Amer: >60 mL/min (ref >60)
GFR calc non Af Amer: >60 mL/min (ref >60)
Glucose, Bld: 94 mg/dL (ref 70–99)
Potassium: 3.6 mmol/L (ref 3.5–5.1)
Sodium: 144 mmol/L (ref 135–145)
Total Bilirubin: 0.5 mg/dL (ref 0.3–1.2)
Total Protein: 7.7 g/dL (ref 6.5–8.1)

## 2018-06-05 LAB — TOTAL PROTEIN, URINE DIPSTICK: Protein, ur: NEGATIVE mg/dL

## 2018-06-05 MED ORDER — HEPARIN SOD (PORK) LOCK FLUSH 100 UNIT/ML IV SOLN
INTRAVENOUS | Status: AC
  Administered 2018-06-05: 500 [IU] via INTRAVENOUS
  Filled 2018-06-05: qty 5

## 2018-06-05 MED ORDER — IOHEXOL 300 MG/ML  SOLN
100.0000 mL | Freq: Once | INTRAMUSCULAR | Status: AC | PRN
  Administered 2018-06-05: 100 mL via INTRAVENOUS

## 2018-06-05 MED ORDER — SODIUM CHLORIDE 0.9% FLUSH
10.0000 mL | Freq: Once | INTRAVENOUS | Status: AC
  Administered 2018-06-05: 10 mL
  Filled 2018-06-05: qty 10

## 2018-06-05 MED ORDER — HEPARIN SOD (PORK) LOCK FLUSH 100 UNIT/ML IV SOLN
500.0000 [IU] | Freq: Once | INTRAVENOUS | Status: AC
  Administered 2018-06-05: 500 [IU] via INTRAVENOUS

## 2018-06-05 MED ORDER — SODIUM CHLORIDE (PF) 0.9 % IJ SOLN
INTRAMUSCULAR | Status: AC
  Filled 2018-06-05: qty 50

## 2018-06-06 ENCOUNTER — Encounter: Payer: Self-pay | Admitting: Hematology

## 2018-06-06 ENCOUNTER — Inpatient Hospital Stay (HOSPITAL_BASED_OUTPATIENT_CLINIC_OR_DEPARTMENT_OTHER): Payer: BC Managed Care – PPO | Admitting: Hematology

## 2018-06-06 ENCOUNTER — Inpatient Hospital Stay: Payer: BC Managed Care – PPO

## 2018-06-06 VITALS — BP 139/86 | HR 114 | Temp 98.2°F | Resp 18 | Ht 64.0 in | Wt 139.5 lb

## 2018-06-06 VITALS — BP 136/78 | HR 72 | Temp 98.4°F | Resp 18

## 2018-06-06 DIAGNOSIS — F411 Generalized anxiety disorder: Secondary | ICD-10-CM | POA: Diagnosis not present

## 2018-06-06 DIAGNOSIS — C530 Malignant neoplasm of endocervix: Secondary | ICD-10-CM | POA: Diagnosis not present

## 2018-06-06 DIAGNOSIS — R030 Elevated blood-pressure reading, without diagnosis of hypertension: Secondary | ICD-10-CM

## 2018-06-06 DIAGNOSIS — C787 Secondary malignant neoplasm of liver and intrahepatic bile duct: Secondary | ICD-10-CM

## 2018-06-06 DIAGNOSIS — Z79899 Other long term (current) drug therapy: Secondary | ICD-10-CM

## 2018-06-06 MED ORDER — SODIUM CHLORIDE 0.9 % IV SOLN
Freq: Once | INTRAVENOUS | Status: AC
  Administered 2018-06-06: 15:00:00 via INTRAVENOUS
  Filled 2018-06-06: qty 250

## 2018-06-06 MED ORDER — SODIUM CHLORIDE 0.9 % IV SOLN
15.4000 mg/kg | Freq: Once | INTRAVENOUS | Status: AC
  Administered 2018-06-06: 1000 mg via INTRAVENOUS
  Filled 2018-06-06: qty 8

## 2018-06-06 MED ORDER — SODIUM CHLORIDE 0.9% FLUSH
10.0000 mL | INTRAVENOUS | Status: DC | PRN
  Administered 2018-06-06: 10 mL
  Filled 2018-06-06: qty 10

## 2018-06-06 MED ORDER — HEPARIN SOD (PORK) LOCK FLUSH 100 UNIT/ML IV SOLN
500.0000 [IU] | Freq: Once | INTRAVENOUS | Status: AC | PRN
  Administered 2018-06-06: 500 [IU]
  Filled 2018-06-06: qty 5

## 2018-06-06 NOTE — Patient Instructions (Signed)
[facility] Discharge Instructions for Patients Receiving Chemotherapy  Today you received the following chemotherapy agents :  Avastin.  To help prevent nausea and vomiting after your treatment, we encourage you to take your nausea medication as prescribed.   If you develop nausea and vomiting that is not controlled by your nausea medication, call the clinic.   BELOW ARE SYMPTOMS THAT SHOULD BE REPORTED IMMEDIATELY:  *FEVER GREATER THAN 100.5 F  *CHILLS WITH OR WITHOUT FEVER  NAUSEA AND VOMITING THAT IS NOT CONTROLLED WITH YOUR NAUSEA MEDICATION  *UNUSUAL SHORTNESS OF BREATH  *UNUSUAL BRUISING OR BLEEDING  TENDERNESS IN MOUTH AND THROAT WITH OR WITHOUT PRESENCE OF ULCERS  *URINARY PROBLEMS  *BOWEL PROBLEMS  UNUSUAL RASH Items with * indicate a potential emergency and should be followed up as soon as possible.  Feel free to call the clinic should you have any questions or concerns. The clinic phone number is [phone].  Please show the Will at check-in to the Emergency Department and triage nurse.    Influenza Virus Vaccine (Flucelvax) What is this medicine? INFLUENZA VIRUS VACCINE (in floo EN zuh VAHY ruhs vak SEEN) helps to reduce the risk of getting influenza also known as the flu. The vaccine only helps protect you against some strains of the flu. This medicine may be used for other purposes; ask your health care provider or pharmacist if you have questions. COMMON BRAND NAME(S): FLUCELVAX What should I tell my health care provider before I take this medicine? They need to know if you have any of these conditions: -bleeding disorder like hemophilia -fever or infection -Guillain-Barre syndrome or other neurological problems -immune system problems -infection with the human immunodeficiency virus (HIV) or AIDS -low blood platelet counts -multiple sclerosis -an unusual or allergic reaction to influenza virus vaccine, other medicines,  foods, dyes or preservatives -pregnant or trying to get pregnant -breast-feeding How should I use this medicine? This vaccine is for injection into a muscle. It is given by a health care professional. A copy of Vaccine Information Statements will be given before each vaccination. Read this sheet carefully each time. The sheet may change frequently. Talk to your pediatrician regarding the use of this medicine in children. Special care may be needed. Overdosage: If you think you've taken too much of this medicine contact a poison control center or emergency room at once. Overdosage: If you think you have taken too much of this medicine contact a poison control center or emergency room at once. NOTE: This medicine is only for you. Do not share this medicine with others. What if I miss a dose? This does not apply. What may interact with this medicine? -chemotherapy or radiation therapy -medicines that lower your immune system like etanercept, anakinra, infliximab, and adalimumab -medicines that treat or prevent blood clots like warfarin -phenytoin -steroid medicines like prednisone or cortisone -theophylline -vaccines This list may not describe all possible interactions. Give your health care provider a list of all the medicines, herbs, non-prescription drugs, or dietary supplements you use. Also tell them if you smoke, drink alcohol, or use illegal drugs. Some items may interact with your medicine. What should I watch for while using this medicine? Report any side effects that do not go away within 3 days to your doctor or health care professional. Call your health care provider if any unusual symptoms occur within 6 weeks of receiving this vaccine. You may still catch the flu, but the illness is not usually as bad.  You cannot get the flu from the vaccine. The vaccine will not protect against colds or other illnesses that may cause fever. The vaccine is needed every year. What side effects may I  notice from receiving this medicine? Side effects that you should report to your doctor or health care professional as soon as possible: -allergic reactions like skin rash, itching or hives, swelling of the face, lips, or tongue Side effects that usually do not require medical attention (Report these to your doctor or health care professional if they continue or are bothersome.): -fever -headache -muscle aches and pains -pain, tenderness, redness, or swelling at the injection site -tiredness This list may not describe all possible side effects. Call your doctor for medical advice about side effects. You may report side effects to FDA at 1-800-FDA-1088. Where should I keep my medicine? The vaccine will be given by a health care professional in a clinic, pharmacy, doctor's office, or other health care setting. You will not be given vaccine doses to store at home. NOTE: This sheet is a summary. It may not cover all possible information. If you have questions about this medicine, talk to your doctor, pharmacist, or health care provider.  2018 Elsevier/Gold Standard (2011-06-20 14:06:47)

## 2018-06-06 NOTE — Progress Notes (Signed)
[facility]  [phone] [phone]  Clinic Follow up Note   Patient Care Team: Avel Sensor, MD as PCP - General (Obstetrics and Gynecology)   Date of Service:  06/06/2018  CC: F/u for metastatic cervical cancer   SUMMARY OF ONCOLOGIC HISTORY:   Cervical cancer ([place])   07/22/2017 Initial Diagnosis    The patient was examined in the wellness center.  Upon vaginal exam, friable mass of tissue presumably from the cervix was interpreted as poorly differentiated high-grade malignant neoplasm.  Follow-up workup with ultrasound of the uterus revealed multiple large uterine fibroids.    07/22/2017 Pathology Results    Biopsy confirmed poorly differentiated high-grade malignant neoplasm.    08/06/2017 Imaging    6.6 cm uterine mass in area of cervix and upper vagina, highly suspicious for cervical carcinoma.  Mild bilateral iliac lymphadenopathy, consistent with metastatic disease. No abdominal lymphadenopathy identified.  Multiple large liver masses, with areas of central necrosis or scarring, highly suspicious for liver metastases. Although unlikely, it is conceivable that some of these lesions could represent other etiology such as focal nodular hyperplasia or adenomas. Consider further evaluation with PET-CT, abdomen MRI without and with contrast, or percutaneous needle biopsy.  Multiple large uterine fibroids, many showing cystic degeneration.  Bilateral cystic adnexal lesions with probably benign features. Differential diagnosis includes ovarian cysts, endometriomas, and hydrosalpinges. These could also be further assessed on PET-CT or MRI.  No evidence of thoracic metastatic disease.     08/09/2017 Tumor Marker    Patient's tumor was tested for the following markers: CA-125 Results of the tumor marker test revealed 79.2    08/14/2017 Pathology Results    Liver, needle/core biopsy, left hepatic lobe - METASTATIC POORLY  DIFFERENTIATED CARCINOMA, SEE COMMENT. Microscopic Comment The tumor is poorly differentiated with abundant necrosis. Pancytokeratin, PAX-8, cytokeratin 7, and p16 are positive. There is weak non-specific staining with TTF-1, synaptophysin (very focal), p63 (very focal). Chromogranin, CD56, S100, desmin, SMA, ER, CD10, cytokeratin 10, cytokeratin 5/6, and cytokeratin 20 are negative. Thus, the immunoprofile is suggest of a gynecologic primary. Dr. Lyndon Code has reviewed the case.     08/21/2017 Procedure    Ultrasound and fluoroscopically guided right internal jugular single lumen power port catheter insertion. Tip in the SVC/RA junction. Catheter ready for use.    08/30/2017 -  Chemotherapy    The patient had received cisplatin and Taxol    09/12/2017 -  Chemotherapy    The patient had bevacizumab (AVASTIN) 800 mg in sodium chloride 0.9 % 100 mL chemo infusion, 15 mg/kg = 800 mg, Intravenous,  Once, 10 of 10 cycles Dose modification: 15 mg/kg (original dose 15 mg/kg, Cycle 8, Reason: Provider Judgment), 15 mg/kg (original dose 15 mg/kg, Cycle 9, Reason: Other (see comments)) Administration: 800 mg (09/20/2017), 800 mg (11/01/2017), 800 mg (11/22/2017), 800 mg (12/13/2017), 800 mg (02/21/2018), 800 mg (04/04/2018), 1,000 mg (04/25/2018), 1,000 mg (05/16/2018), 1,000 mg (06/06/2018)  for chemotherapy treatment.     10/24/2017 Imaging    Interval resolution of large cervical mass and bilateral iliac lymphadenopathy since previous study.  Significant decrease in size of diffuse liver metastases since prior study. No new or progressive metastatic disease identified.  Stable probably benign cystic lesion in right adnexa. Previously seen left adnexal cystic lesion has resolved since prior study.  Stable large uterine fibroids. Increased mild right hydronephrosis due to extrinsic compression of ureter by enlarged uterus.    02/20/2018 Imaging    Interval improvement in diffuse liver  metastases since prior study. No new  or progressive metastatic disease.  Stable large uterine fibroids.  No significant change in tubular cystic lesion in right adnexa, suspicious for hydrosalpinx or endometrioma.    06/05/2018 Imaging    CT AP W Contrast 06/05/18 IMPRESSION: New mild lymphadenopathy in left paraaortic region and porta hepatis, and slight increase in size of right external iliac lymph node, consistent with metastatic disease.  Increased soft tissue prominence in region of cervix and right vaginal cuff, consistent with local tumor progression.  Multiple liver metastases, with mild decrease in size of several metastatic lesions.  Stable uterine fibroids.     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([place])   08/09/2017 Initial Diagnosis    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[facility])    09/12/2017 -  Chemotherapy    The patient had bevacizumab (AVASTIN) 800 mg in sodium chloride 0.9 % 100 mL chemo infusion, 15 mg/kg = 800 mg, Intravenous,  Once, 10 of 10 cycles Dose modification: 15 mg/kg (original dose 15 mg/kg, Cycle 8, Reason: Provider Judgment), 15 mg/kg (original dose 15 mg/kg, Cycle 9, Reason: Other (see comments)) Administration: 800 mg (09/20/2017), 800 mg (11/01/2017), 800 mg (11/22/2017), 800 mg (12/13/2017), 800 mg (02/21/2018), 800 mg (04/04/2018), 1,000 mg (04/25/2018), 1,000 mg (05/16/2018), 1,000 mg (06/06/2018)  for chemotherapy treatment.      CURRENT THERAPY  Avastin every 3 weeks    INTERVAL HISTORY:  Stephanie Fuentes is here for a follow up of her metastatic cervical cancer and Avastin. She is under the care of Dr. Alvy Bimler and I am follow seeing them in her absence. She presents to the clinic today accompanied by her sister.   She notes she did well with CT with no residual side effects. She notes she currently has a callous on her left sole. This only hurts when she is on her feet for a long period of time. She note with Avastin she gets hot but no epistaxis. Her 10mg  of Amlodipine is controlling her BP well.  She would  like to continue Avastin for now and restart chemo after talking with Dr. Alvy Bimler.     REVIEW OF SYSTEMS:   Constitutional: Denies fevers, chills or abnormal weight loss Eyes: Denies blurriness of vision Ears, nose, mouth, throat, and face: Denies mucositis or sore throat Respiratory: Denies cough, dyspnea or wheezes Cardiovascular: Denies palpitation, chest discomfort or lower extremity swelling Gastrointestinal:  Denies nausea, heartburn or change in bowel habits Skin: Denies abnormal skin rashes (+) callous on sole of left foot  Lymphatics: Denies new lymphadenopathy or easy bruising Neurological:Denies numbness, tingling or new weaknesses Behavioral/Psych: Mood is stable, no new changes  All other systems were reviewed with the patient and are negative.  MEDICAL HISTORY:  Past Medical History:  Diagnosis Date  . Anemia   . Blood transfusion without reported diagnosis   . Fibroids   . uterine ca dx'd 07/30/17    SURGICAL HISTORY: Past Surgical History:  Procedure Laterality Date  . CESAREAN SECTION  2016   Reason: IUFD, Fibroids  . IR FLUORO GUIDE PORT INSERTION RIGHT  08/21/2017  . IR US GUIDE VASC ACCESS RIGHT  08/21/2017    I have reviewed the social history and family history with the patient and they are unchanged from previous note.  ALLERGIES:  is allergic to benadryl [diphenhydramine].  MEDICATIONS:  Current Outpatient Medications  Medication Sig Dispense Refill  . acetaminophen (TYLENOL) 325 MG tablet Take 650 mg by mouth every 6 (six) hours as needed.    Marland Kitchen  ALPRAZolam (XANAX) 0.5 MG tablet Take 1 tablet (0.5 mg total) by mouth 2 (two) times daily as needed for anxiety. 60 tablet 0  . amLODipine (NORVASC) 10 MG tablet TAKE 1 TABLET(10 MG) BY MOUTH DAILY 30 tablet 3  . magnesium oxide (MAG-OX) 400 (241.3 Mg) MG tablet Take 1 tablet (400 mg total) by mouth 2 (two) times daily. 60 tablet 6  . prochlorperazine (COMPAZINE) 10 MG tablet Take 1 tablet (10 mg total) by  mouth every 6 (six) hours as needed for nausea or vomiting. 30 tablet 0  . prochlorperazine (COMPAZINE) 10 MG tablet TAKE 1 TABLET(10 MG) BY MOUTH EVERY 6 HOURS AS NEEDED FOR NAUSEA OR VOMITING 30 tablet 9  . sertraline (ZOLOFT) 25 MG tablet Take 1 tablet (25 mg total) by mouth daily. 30 tablet 9   No current facility-administered medications for this visit.    Facility-Administered Medications Ordered in Other Visits  Medication Dose Route Frequency Provider Last Rate Last Dose  . 0.9 %  sodium chloride infusion   Intravenous Once Alvy Bimler, Ni, MD      . ferumoxytol (FERAHEME) 510 mg in sodium chloride 0.9 % 100 mL IVPB  510 mg Intravenous Once Alvy Bimler, Ni, MD        PHYSICAL EXAMINATION: ECOG PERFORMANCE STATUS: 0 - Asymptomatic  Vitals:   06/06/18 1318  BP: 139/86  Pulse: (!) 114  Resp: 18  Temp: 98.2 F (36.8 C)  SpO2: 99%   Filed Weights   06/06/18 1318  Weight: 139 lb 8 oz (63.3 kg)    GENERAL:alert, no distress and comfortable SKIN: skin color, texture, turgor are normal, no rashes or significant lesions EYES: normal, Conjunctiva are pink and non-injected, sclera clear OROPHARYNX:no exudate, no erythema and lips, buccal mucosa, and tongue normal  NECK: supple, thyroid normal size, non-tender, without nodularity LYMPH:  no palpable lymphadenopathy in the cervical, axillary or inguinal LUNGS: clear to auscultation and percussion with normal breathing effort HEART: regular rate & rhythm and no murmurs and no lower extremity edema ABDOMEN:abdomen soft, non-tender and normal bowel sounds Musculoskeletal:no cyanosis of digits and no clubbing  NEURO: alert & oriented x 3 with fluent speech, no focal motor/sensory deficits  LABORATORY DATA:  I have reviewed the data as listed CBC Latest Ref Rng & Units 06/05/2018 05/16/2018 04/25/2018  WBC 4.0 - 10.5 K/uL 5.6 7.0 4.2  Hemoglobin 12.0 - 15.0 g/dL 13.8 14.0 13.8  Hematocrit 36.0 - 46.0 % 41.6 41.0 40.8  Platelets 150 - 400  K/uL 161 157 157     CMP Latest Ref Rng & Units 06/05/2018 05/16/2018 04/25/2018  Glucose 70 - 99 mg/dL 94 85 101(H)  BUN 6 - 20 mg/dL 8 13 10   Creatinine 0.44 - 1.00 mg/dL 0.77 0.79 0.77  Sodium 135 - 145 mmol/L 144 142 143  Potassium 3.5 - 5.1 mmol/L 3.6 3.8 3.6  Chloride 98 - 111 mmol/L 107 105 106  CO2 22 - 32 mmol/L 27 26 26   Calcium 8.9 - 10.3 mg/dL 9.7 9.8 9.7  Total Protein 6.5 - 8.1 g/dL 7.7 7.4 7.7  Total Bilirubin 0.3 - 1.2 mg/dL 0.5 0.7 0.5  Alkaline Phos 38 - 126 U/L 88 85 79  AST 15 - 41 U/L 21 18 21   ALT 0 - 44 U/L 11 13 17       RADIOGRAPHIC STUDIES: I have personally reviewed the radiological images as listed and agreed with the findings in the report. Ct Abdomen Pelvis W Contrast  Result Date: 06/05/2018 CLINICAL DATA:  Followup metastatic cervical carcinoma. On oral chemotherapy. EXAM: CT ABDOMEN AND PELVIS WITH CONTRAST TECHNIQUE: Multidetector CT imaging of the abdomen and pelvis was performed using the standard protocol following bolus administration of intravenous contrast. CONTRAST:  134mL OMNIPAQUE IOHEXOL 300 MG/ML  SOLN COMPARISON:  02/20/2018 FINDINGS: Lower Chest: No acute findings. Hepatobiliary: Multiple liver metastases are again seen, some showing mild decrease in size since previous study. Index lesion in segment 3 currently measures 5.9 x 3.9 cm compared to 6.5 x 4.2 cm previously. No new or enlarging liver masses identified. Gallbladder is unremarkable. No evidence of biliary ductal dilatation. Pancreas:  No mass or inflammatory changes. Spleen: Within normal limits in size and appearance. Adrenals/Urinary Tract: No masses identified. No evidence of hydronephrosis. Stomach/Bowel: No evidence of obstruction, inflammatory process or abnormal fluid collections. Vascular/Lymphatic: New 12 mm lymph node in the porta hepatis. New mild left paraaortic lymphadenopathy, with largest lymph node measuring 12 mm on image 34/2. Mild right external iliac lymphadenopathy  is increased with lymph node currently measuring 1.4 cm on image 60/2 compared to 11 mm previously. No abdominal aortic aneurysm. Reproductive: Multiple uterine fibroids remain stable, many of which are calcified. Irregular soft tissue prominence is seen in the region of the cervix and right vaginal cuff, which measures approximately 5.1 x 4.8 cm compared to 3.8 x 3.2 cm previously. Ovoid cystic lesion in right adnexa measures 5.7 x 2.2 cm, without significant change compared to prior exams. Other:  None. Musculoskeletal:  No suspicious bone lesions identified. IMPRESSION: New mild lymphadenopathy in left paraaortic region and porta hepatis, and slight increase in size of right external iliac lymph node, consistent with metastatic disease. Increased soft tissue prominence in region of cervix and right vaginal cuff, consistent with local tumor progression. Multiple liver metastases, with mild decrease in size of several metastatic lesions. Stable uterine fibroids. Electronically Signed   By: Earle Gell M.D.   On: 06/05/2018 14:17     ASSESSMENT & PLAN:  Stephanie Fuentes is a 47 y.o. female with    1. Cervical Cancer, metastatic to liver -She was diagnosed in 06/2107 -She completed 6 cycles of Cisplatin and Taxol with good response,  and now she is currently on maintenance Avastin every 3 weeks. Plan to complete at least 6 months. She is tolerating treatment very well.  -She received flu shot on 04/04/18 -I reviewed her restaging CT AP images from 06/05/2018 in person with pt and her sister today,  which showed some improved in  liver mets, but cervical tumor and abdominal lymph nodes have slightly progressed, Overall stable with mild progression.  -I discussed to better control her cancer, I will recommend restarting chemotherapy. She tolerated and responded very well to CT previously and no residual side effects. If she is not able to tolerate Cisplatin, carboplatin is a possible replacement options. Pt  would like to proceed with Avastin for today and she will discuss restarting chemo with Dr. Alvy Bimler when she returns.  -Given the very mild disease progression, continue Avastin with close follow up is also reasonable at this stage  -I briefly discussed her possibility of surgery of her primary tumor at cervix, will defer this to Dr. Alvy Bimler. Due to her diffuse liver metastasis, curative surgery will be very challenge.  -Labs reviewed, her CBC, CMP and Urine protein are all WNL. Adequate to proceed with Avastin today.   2. Elevated BP without diagnosis of hypertension -Blood pressures well controlled with current prescription antihypertensives. Continue, will monitor  -BP at  139/86 today (06/06/18)   3. Generalized anxiety disorder -She has significant anxiety but denies depression -Dr. Alvy Bimler previously recommended Zoloft along with Xanax as needed. Pt agreed to proceed.  PLAN:  -Scan and Labs reviewed and adequate to proceed with Avastin today  -Lab, flush and f/u with Dr. Alvy Bimler on 12/5 -Due to slight disease progression, I will schedule her to restart chemo cisplatin and taxol on 12/6      No problem-specific Assessment & Plan notes found for this encounter.   No orders of the defined types were placed in this encounter.  All questions were answered. The patient knows to call the clinic with any problems, questions or concerns. No barriers to learning was detected. I spent 25 minutes counseling the patient face to face. The total time spent in the appointment was 30 minutes and more than 50% was on counseling and review of test results     Truitt Merle, MD 06/06/2018   I, Joslyn Devon, am acting as scribe for Truitt Merle, MD.   I have reviewed the above documentation for accuracy and completeness, and I agree with the above.

## 2018-06-11 NOTE — Progress Notes (Signed)
Disability successfully faxed to Hiram Comber at [phone]. Mailed copy to patient address on file.

## 2018-06-20 ENCOUNTER — Telehealth: Payer: Self-pay | Admitting: *Deleted

## 2018-06-20 ENCOUNTER — Other Ambulatory Visit (HOSPITAL_COMMUNITY)
Admission: RE | Admit: 2018-06-20 | Discharge: 2018-06-20 | Disposition: A | Discharge status: Home / Self Care | Payer: BC Managed Care – PPO | Source: Ambulatory Visit | Attending: Hematology and Oncology | Admitting: Hematology and Oncology

## 2018-06-20 DIAGNOSIS — C55 Malignant neoplasm of uterus, part unspecified: Secondary | ICD-10-CM | POA: Diagnosis present

## 2018-06-20 NOTE — Telephone Encounter (Signed)
Notified [facility] to add PD-L1 testing as directed below.

## 2018-06-20 NOTE — Telephone Encounter (Signed)
-----  Message from Heath Lark, MD sent at 06/20/2018  8:40 AM EST ----- Regarding: add PD-L 1 testing Can pathologist add PD-L1 testing on liver biopsy from 1/23? Accession: [id number]

## 2018-06-26 ENCOUNTER — Telehealth: Payer: Self-pay

## 2018-06-26 ENCOUNTER — Telehealth: Payer: Self-pay | Admitting: Hematology and Oncology

## 2018-06-26 ENCOUNTER — Inpatient Hospital Stay: Payer: BC Managed Care – PPO | Attending: Hematology and Oncology

## 2018-06-26 ENCOUNTER — Encounter: Payer: Self-pay | Admitting: Hematology and Oncology

## 2018-06-26 ENCOUNTER — Inpatient Hospital Stay: Payer: BC Managed Care – PPO

## 2018-06-26 ENCOUNTER — Inpatient Hospital Stay (HOSPITAL_BASED_OUTPATIENT_CLINIC_OR_DEPARTMENT_OTHER): Payer: BC Managed Care – PPO | Admitting: Hematology and Oncology

## 2018-06-26 ENCOUNTER — Other Ambulatory Visit: Payer: Self-pay | Admitting: Hematology and Oncology

## 2018-06-26 DIAGNOSIS — C787 Secondary malignant neoplasm of liver and intrahepatic bile duct: Secondary | ICD-10-CM | POA: Diagnosis not present

## 2018-06-26 DIAGNOSIS — Z79899 Other long term (current) drug therapy: Secondary | ICD-10-CM | POA: Insufficient documentation

## 2018-06-26 DIAGNOSIS — R030 Elevated blood-pressure reading, without diagnosis of hypertension: Secondary | ICD-10-CM | POA: Diagnosis not present

## 2018-06-26 DIAGNOSIS — D500 Iron deficiency anemia secondary to blood loss (chronic): Secondary | ICD-10-CM

## 2018-06-26 DIAGNOSIS — Z5111 Encounter for antineoplastic chemotherapy: Secondary | ICD-10-CM | POA: Diagnosis not present

## 2018-06-26 DIAGNOSIS — C530 Malignant neoplasm of endocervix: Secondary | ICD-10-CM | POA: Insufficient documentation

## 2018-06-26 DIAGNOSIS — Z7189 Other specified counseling: Secondary | ICD-10-CM

## 2018-06-26 LAB — CBC WITH DIFFERENTIAL/PLATELET
ABS IMMATURE GRANULOCYTES: 0.02 10*3/uL (ref 0.00–0.07)
BASOS ABS: 0 10*3/uL (ref 0.0–0.1)
Basophils Relative: 0 %
Eosinophils Absolute: 0 10*3/uL (ref 0.0–0.5)
Eosinophils Relative: 0 %
HEMATOCRIT: 39.6 % (ref 36.0–46.0)
HEMOGLOBIN: 13.2 g/dL (ref 12.0–15.0)
Immature Granulocytes: 0 %
LYMPHS ABS: 1.1 10*3/uL (ref 0.7–4.0)
LYMPHS PCT: 17 %
MCH: 29.9 pg (ref 26.0–34.0)
MCHC: 33.3 g/dL (ref 30.0–36.0)
MCV: 89.8 fL (ref 80.0–100.0)
Monocytes Absolute: 0.5 10*3/uL (ref 0.1–1.0)
Monocytes Relative: 8 %
NEUTROS ABS: 4.7 10*3/uL (ref 1.7–7.7)
NRBC: 0 % (ref 0.0–0.2)
Neutrophils Relative %: 75 %
Platelets: 183 10*3/uL (ref 150–400)
RBC: 4.41 MIL/uL (ref 3.87–5.11)
RDW: 12.6 % (ref 11.5–15.5)
WBC: 6.3 10*3/uL (ref 4.0–10.5)

## 2018-06-26 LAB — COMPREHENSIVE METABOLIC PANEL
ALT: 10 U/L (ref 0–44)
AST: 20 U/L (ref 15–41)
Albumin: 4 g/dL (ref 3.5–5.0)
Alkaline Phosphatase: 101 U/L (ref 38–126)
Anion gap: 11 (ref 5–15)
BUN: 10 mg/dL (ref 6–20)
CHLORIDE: 105 mmol/L (ref 98–111)
CO2: 25 mmol/L (ref 22–32)
Calcium: 9.5 mg/dL (ref 8.9–10.3)
Creatinine, Ser: 0.83 mg/dL (ref 0.44–1.00)
GFR calc Af Amer: >60 mL/min (ref >60)
Glucose, Bld: 116 mg/dL — ABNORMAL HIGH (ref 70–99)
POTASSIUM: 3.4 mmol/L — AB (ref 3.5–5.1)
SODIUM: 141 mmol/L (ref 135–145)
Total Bilirubin: 0.7 mg/dL (ref 0.3–1.2)
Total Protein: 7.6 g/dL (ref 6.5–8.1)

## 2018-06-26 LAB — TOTAL PROTEIN, URINE DIPSTICK

## 2018-06-26 MED ORDER — HEPARIN SOD (PORK) LOCK FLUSH 100 UNIT/ML IV SOLN
500.0000 [IU] | Freq: Once | INTRAVENOUS | Status: AC
  Administered 2018-06-26: 500 [IU]
  Filled 2018-06-26: qty 5

## 2018-06-26 MED ORDER — SODIUM CHLORIDE 0.9% FLUSH
10.0000 mL | Freq: Once | INTRAVENOUS | Status: AC
  Administered 2018-06-26: 10 mL
  Filled 2018-06-26: qty 10

## 2018-06-26 NOTE — Progress Notes (Signed)
[facility] OFFICE PROGRESS NOTE  Patient Care Team: Avel Sensor, MD as PCP - General (Obstetrics and Gynecology)  ASSESSMENT & PLAN:  Cervical cancer [facility]) I have review recent imaging study She is asymptomatic Previously, I have requested additional testing to be done on her liver biopsy 25% of the cells tested positive for PDL 1.  We discussed the risk, benefits, side effects of pembrolizumab and she agreed to proceed We will get insurance prior authorization I recommend minimum 3 cycles of treatment before repeat imaging studies   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[facility]) Despite recurrence of cancer in her liver, she is not symptomatic Continue to observe closely  Goals of care, counseling/discussion The patient is aware, with relapsed disease, this is not curative She would like to proceed with palliative treatment She is not able to work I have spent some time completing application for her to continue on disability   No orders of the defined types were placed in this encounter.   INTERVAL HISTORY: Please see below for problem oriented charting. She returns to discuss plan of care She is not symptomatic from liver metastasis No pain, nausea or changes in bowel habits She denies recurrence of vaginal bleeding No recent infection, fever or chills - SUMMARY OF ONCOLOGIC HISTORY: Oncology History   She tested positive for PD-L1 25% from liver biopsy     Cervical cancer ([place])   07/22/2017 Initial Diagnosis    The patient was examined in the wellness center.  Upon vaginal exam, friable mass of tissue presumably from the cervix was interpreted as poorly differentiated high-grade malignant neoplasm.  Follow-up workup with ultrasound of the uterus revealed multiple large uterine fibroids.    07/22/2017 Pathology Results    Biopsy confirmed poorly differentiated high-grade malignant neoplasm.    08/06/2017 Imaging    6.6 cm uterine mass in area of cervix and  upper vagina, highly suspicious for cervical carcinoma.  Mild bilateral iliac lymphadenopathy, consistent with metastatic disease. No abdominal lymphadenopathy identified.  Multiple large liver masses, with areas of central necrosis or scarring, highly suspicious for liver metastases. Although unlikely, it is conceivable that some of these lesions could represent other etiology such as focal nodular hyperplasia or adenomas. Consider further evaluation with PET-CT, abdomen MRI without and with contrast, or percutaneous needle biopsy.  Multiple large uterine fibroids, many showing cystic degeneration.  Bilateral cystic adnexal lesions with probably benign features. Differential diagnosis includes ovarian cysts, endometriomas, and hydrosalpinges. These could also be further assessed on PET-CT or MRI.  No evidence of thoracic metastatic disease.     08/09/2017 Tumor Marker    Patient's tumor was tested for the following markers: CA-125 Results of the tumor marker test revealed 79.2    08/14/2017 Pathology Results    Liver, needle/core biopsy, left hepatic lobe - METASTATIC POORLY DIFFERENTIATED CARCINOMA, SEE COMMENT. Microscopic Comment The tumor is poorly differentiated with abundant necrosis. Pancytokeratin, PAX-8, cytokeratin 7, and p16 are positive. There is weak non-specific staining with TTF-1, synaptophysin (very focal), p63 (very focal). Chromogranin, CD56, S100, desmin, SMA, ER, CD10, cytokeratin 10, cytokeratin 5/6, and cytokeratin 20 are negative. Thus, the immunoprofile is suggest of a gynecologic primary. Dr. Lyndon Code has reviewed the case.     08/21/2017 Procedure    Ultrasound and fluoroscopically guided right internal jugular single lumen power port catheter insertion. Tip in the SVC/RA junction. Catheter ready for use.    08/30/2017 -  Chemotherapy    The patient had received cisplatin and Taxol  09/12/2017 - 06/26/2018 Chemotherapy    The patient had bevacizumab (AVASTIN)  800 mg in sodium chloride 0.9 % 100 mL chemo infusion, 15 mg/kg = 800 mg, Intravenous,  Once, 10 of 10 cycles Dose modification: 15 mg/kg (original dose 15 mg/kg, Cycle 8, Reason: Provider Judgment), 15 mg/kg (original dose 15 mg/kg, Cycle 9, Reason: Other (see comments)) Administration: 800 mg (09/20/2017), 800 mg (11/01/2017), 800 mg (11/22/2017), 800 mg (12/13/2017), 800 mg (02/21/2018), 800 mg (04/04/2018), 1,000 mg (04/25/2018), 1,000 mg (05/16/2018), 1,000 mg (06/06/2018)  for chemotherapy treatment.     10/24/2017 Imaging    Interval resolution of large cervical mass and bilateral iliac lymphadenopathy since previous study.  Significant decrease in size of diffuse liver metastases since prior study. No new or progressive metastatic disease identified.  Stable probably benign cystic lesion in right adnexa. Previously seen left adnexal cystic lesion has resolved since prior study.  Stable large uterine fibroids. Increased mild right hydronephrosis due to extrinsic compression of ureter by enlarged uterus.    02/20/2018 Imaging    Interval improvement in diffuse liver metastases since prior study. No new or progressive metastatic disease.  Stable large uterine fibroids.  No significant change in tubular cystic lesion in right adnexa, suspicious for hydrosalpinx or endometrioma.    06/05/2018 Imaging    CT AP W Contrast 06/05/18 IMPRESSION: New mild lymphadenopathy in left paraaortic region and porta hepatis, and slight increase in size of right external iliac lymph node, consistent with metastatic disease.  Increased soft tissue prominence in region of cervix and right vaginal cuff, consistent with local tumor progression.  Multiple liver metastases, with mild decrease in size of several metastatic lesions.  Stable uterine fibroids.     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([place])   08/09/2017 Initial Diagnosis    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[facility])    09/12/2017 - 06/26/2018 Chemotherapy    The patient had  bevacizumab (AVASTIN) 800 mg in sodium chloride 0.9 % 100 mL chemo infusion, 15 mg/kg = 800 mg, Intravenous,  Once, 10 of 10 cycles Dose modification: 15 mg/kg (original dose 15 mg/kg, Cycle 8, Reason: Provider Judgment), 15 mg/kg (original dose 15 mg/kg, Cycle 9, Reason: Other (see comments)) Administration: 800 mg (09/20/2017), 800 mg (11/01/2017), 800 mg (11/22/2017), 800 mg (12/13/2017), 800 mg (02/21/2018), 800 mg (04/04/2018), 1,000 mg (04/25/2018), 1,000 mg (05/16/2018), 1,000 mg (06/06/2018)  for chemotherapy treatment.     06/26/2018 -  Chemotherapy    The patient had pembrolizumab (KEYTRUDA) 200 mg in sodium chloride 0.9 % 50 mL chemo infusion, 200 mg, Intravenous, Once, 0 of 4 cycles  for chemotherapy treatment.      REVIEW OF SYSTEMS:   Constitutional: Denies fevers, chills or abnormal weight loss Eyes: Denies blurriness of vision Ears, nose, mouth, throat, and face: Denies mucositis or sore throat Respiratory: Denies cough, dyspnea or wheezes Cardiovascular: Denies palpitation, chest discomfort or lower extremity swelling Gastrointestinal:  Denies nausea, heartburn or change in bowel habits Skin: Denies abnormal skin rashes Lymphatics: Denies new lymphadenopathy or easy bruising Neurological:Denies numbness, tingling or new weaknesses Behavioral/Psych: Mood is stable, no new changes  All other systems were reviewed with the patient and are negative.  I have reviewed the past medical history, past surgical history, social history and family history with the patient and they are unchanged from previous note.  ALLERGIES:  is allergic to benadryl [diphenhydramine].  MEDICATIONS:  Current Outpatient Medications  Medication Sig Dispense Refill  . acetaminophen (TYLENOL) 325 MG tablet Take 650 mg by mouth  every 6 (six) hours as needed.    . ALPRAZolam (XANAX) 0.5 MG tablet Take 1 tablet (0.5 mg total) by mouth 2 (two) times daily as needed for anxiety. 60 tablet 0  . amLODipine (NORVASC)  10 MG tablet TAKE 1 TABLET(10 MG) BY MOUTH DAILY 30 tablet 3  . magnesium oxide (MAG-OX) 400 (241.3 Mg) MG tablet Take 1 tablet (400 mg total) by mouth 2 (two) times daily. 60 tablet 6  . prochlorperazine (COMPAZINE) 10 MG tablet Take 1 tablet (10 mg total) by mouth every 6 (six) hours as needed for nausea or vomiting. 30 tablet 0  . prochlorperazine (COMPAZINE) 10 MG tablet TAKE 1 TABLET(10 MG) BY MOUTH EVERY 6 HOURS AS NEEDED FOR NAUSEA OR VOMITING 30 tablet 9  . sertraline (ZOLOFT) 25 MG tablet Take 1 tablet (25 mg total) by mouth daily. 30 tablet 9   No current facility-administered medications for this visit.    Facility-Administered Medications Ordered in Other Visits  Medication Dose Route Frequency Provider Last Rate Last Dose  . 0.9 %  sodium chloride infusion   Intravenous Once Alvy Bimler, Lenee Franze, MD      . ferumoxytol (FERAHEME) 510 mg in sodium chloride 0.9 % 100 mL IVPB  510 mg Intravenous Once Alvy Bimler, Jalei Shibley, MD        PHYSICAL EXAMINATION: ECOG PERFORMANCE STATUS: 0 - Asymptomatic  Vitals:   06/26/18 0858  BP: 124/66  Pulse: 100  Resp: 18  Temp: 98.6 F (37 C)  SpO2: 100%   Filed Weights   06/26/18 0858  Weight: 142 lb 6.4 oz (64.6 kg)    GENERAL:alert, no distress and comfortable SKIN: skin color, texture, turgor are normal, no rashes or significant lesions EYES: normal, Conjunctiva are pink and non-injected, sclera clear OROPHARYNX:no exudate, no erythema and lips, buccal mucosa, and tongue normal  NECK: supple, thyroid normal size, non-tender, without nodularity LYMPH:  no palpable lymphadenopathy in the cervical, axillary or inguinal LUNGS: clear to auscultation and percussion with normal breathing effort HEART: regular rate & rhythm and no murmurs and no lower extremity edema ABDOMEN:abdomen soft, non-tender and normal bowel sounds Musculoskeletal:no cyanosis of digits and no clubbing  NEURO: alert & oriented x 3 with fluent speech, no focal motor/sensory  deficits  LABORATORY DATA:  I have reviewed the data as listed    Component Value Date/Time   NA 141 06/26/2018 0822   K 3.4 (L) 06/26/2018 0822   CL 105 06/26/2018 0822   CO2 25 06/26/2018 0822   GLUCOSE 116 (H) 06/26/2018 0822   BUN 10 06/26/2018 0822   CREATININE 0.83 06/26/2018 0822   CREATININE 0.85 02/20/2018 0850   CALCIUM 9.5 06/26/2018 0822   PROT 7.6 06/26/2018 0822   ALBUMIN 4.0 06/26/2018 0822   AST 20 06/26/2018 0822   AST 22 02/20/2018 0850   ALT 10 06/26/2018 0822   ALT 19 02/20/2018 0850   ALKPHOS 101 06/26/2018 0822   BILITOT 0.7 06/26/2018 0822   BILITOT 0.6 02/20/2018 0850   GFRNONAA >60 06/26/2018 0822   GFRNONAA >60 02/20/2018 0850   GFRAA >60 06/26/2018 0822   GFRAA >60 02/20/2018 0850    No results found for: SPEP, UPEP  Lab Results  Component Value Date   WBC 6.3 06/26/2018   NEUTROABS 4.7 06/26/2018   HGB 13.2 06/26/2018   HCT 39.6 06/26/2018   MCV 89.8 06/26/2018   PLT 183 06/26/2018      Chemistry      Component Value Date/Time   NA 141  06/26/2018 0822   K 3.4 (L) 06/26/2018 0822   CL 105 06/26/2018 0822   CO2 25 06/26/2018 0822   BUN 10 06/26/2018 0822   CREATININE 0.83 06/26/2018 0822   CREATININE 0.85 02/20/2018 0850      Component Value Date/Time   CALCIUM 9.5 06/26/2018 0822   ALKPHOS 101 06/26/2018 0822   AST 20 06/26/2018 0822   AST 22 02/20/2018 0850   ALT 10 06/26/2018 0822   ALT 19 02/20/2018 0850   BILITOT 0.7 06/26/2018 0822   BILITOT 0.6 02/20/2018 0850       RADIOGRAPHIC STUDIES:  I have personally reviewed the radiological images as listed and agreed with the findings in the report. Ct Abdomen Pelvis W Contrast  Result Date: 06/05/2018 CLINICAL DATA:  Followup metastatic cervical carcinoma. On oral chemotherapy. EXAM: CT ABDOMEN AND PELVIS WITH CONTRAST TECHNIQUE: Multidetector CT imaging of the abdomen and pelvis was performed using the standard protocol following bolus administration of intravenous  contrast. CONTRAST:  156m OMNIPAQUE IOHEXOL 300 MG/ML  SOLN COMPARISON:  02/20/2018 FINDINGS: Lower Chest: No acute findings. Hepatobiliary: Multiple liver metastases are again seen, some showing mild decrease in size since previous study. Index lesion in segment 3 currently measures 5.9 x 3.9 cm compared to 6.5 x 4.2 cm previously. No new or enlarging liver masses identified. Gallbladder is unremarkable. No evidence of biliary ductal dilatation. Pancreas:  No mass or inflammatory changes. Spleen: Within normal limits in size and appearance. Adrenals/Urinary Tract: No masses identified. No evidence of hydronephrosis. Stomach/Bowel: No evidence of obstruction, inflammatory process or abnormal fluid collections. Vascular/Lymphatic: New 12 mm lymph node in the porta hepatis. New mild left paraaortic lymphadenopathy, with largest lymph node measuring 12 mm on image 34/2. Mild right external iliac lymphadenopathy is increased with lymph node currently measuring 1.4 cm on image 60/2 compared to 11 mm previously. No abdominal aortic aneurysm. Reproductive: Multiple uterine fibroids remain stable, many of which are calcified. Irregular soft tissue prominence is seen in the region of the cervix and right vaginal cuff, which measures approximately 5.1 x 4.8 cm compared to 3.8 x 3.2 cm previously. Ovoid cystic lesion in right adnexa measures 5.7 x 2.2 cm, without significant change compared to prior exams. Other:  None. Musculoskeletal:  No suspicious bone lesions identified. IMPRESSION: New mild lymphadenopathy in left paraaortic region and porta hepatis, and slight increase in size of right external iliac lymph node, consistent with metastatic disease. Increased soft tissue prominence in region of cervix and right vaginal cuff, consistent with local tumor progression. Multiple liver metastases, with mild decrease in size of several metastatic lesions. Stable uterine fibroids. Electronically Signed   By: JEarle GellM.D.    On: 06/05/2018 14:17    All questions were answered. The patient knows to call the clinic with any problems, questions or concerns. No barriers to learning was detected.  I spent 30 minutes counseling the patient face to face. The total time spent in the appointment was 40 minutes and more than 50% was on counseling and review of test results  NHeath Lark MD 06/26/2018 9:47 AM

## 2018-06-26 NOTE — Assessment & Plan Note (Addendum)
I have review recent imaging study She is asymptomatic Previously, I have requested additional testing to be done on her liver biopsy 25% of the cells tested positive for PDL 1.  We discussed the risk, benefits, side effects of pembrolizumab and she agreed to proceed We will get insurance prior authorization I recommend minimum 3 cycles of treatment before repeat imaging studies

## 2018-06-26 NOTE — Telephone Encounter (Signed)
Called Stephanie Fuentes and asked her to come in at [facility] for her infusion that is now only 2 hours instead of 8 hours.  She will check in and come straight to infusion for her treatment. Gardiner Rhyme

## 2018-06-26 NOTE — Progress Notes (Signed)
DISCONTINUE OFF PATHWAY REGIMEN Tawni Pummel Dx]   [id number]:Bevacizumab 15 mg/kg + Cisplatin 50 mg/m2 + Paclitaxel 175 mg/m2 q21 Days:   A cycle is every 21 days:     Paclitaxel      Cisplatin      Bevacizumab   **Always confirm dose/schedule in your pharmacy ordering system**  REASON: Disease Progression PRIOR TREATMENT: Bevacizumab 15 mg/kg + Cisplatin 50 mg/m2 + Paclitaxel 175 mg/m2 q21 Days TREATMENT RESPONSE: Progressive Disease (PD)  START OFF PATHWAY REGIMEN - [Other Dx]   [id number]:Pembrolizumab 200 mg q21 Days:   A cycle is 21 days:     Pembrolizumab   **Always confirm dose/schedule in your pharmacy ordering system**  Patient Characteristics: Intent of Therapy: Non-Curative / Palliative Intent, Discussed with Patient

## 2018-06-26 NOTE — Telephone Encounter (Signed)
Called and given below message. She verbalized understanding. 

## 2018-06-26 NOTE — Telephone Encounter (Signed)
-----   Message from Heath Lark, MD sent at 06/26/2018 11:41 AM EST ----- Regarding: chemo tomorrow Pls call her, it is authorized Keep appt tomorrow I will send scheduling msg to scheduling 12/27

## 2018-06-26 NOTE — Assessment & Plan Note (Signed)
The patient is aware, with relapsed disease, this is not curative She would like to proceed with palliative treatment She is not able to work I have spent some time completing application for her to continue on disability

## 2018-06-26 NOTE — Telephone Encounter (Signed)
Return for no new orders as per 12/5 los

## 2018-06-26 NOTE — Assessment & Plan Note (Signed)
Despite recurrence of cancer in her liver, she is not symptomatic Continue to observe closely

## 2018-06-27 ENCOUNTER — Inpatient Hospital Stay: Payer: BC Managed Care – PPO

## 2018-06-27 ENCOUNTER — Other Ambulatory Visit: Payer: Self-pay | Admitting: Hematology and Oncology

## 2018-06-27 VITALS — BP 126/75 | HR 100 | Temp 98.3°F | Resp 18

## 2018-06-27 DIAGNOSIS — D500 Iron deficiency anemia secondary to blood loss (chronic): Secondary | ICD-10-CM

## 2018-06-27 DIAGNOSIS — C787 Secondary malignant neoplasm of liver and intrahepatic bile duct: Secondary | ICD-10-CM

## 2018-06-27 DIAGNOSIS — C530 Malignant neoplasm of endocervix: Secondary | ICD-10-CM

## 2018-06-27 DIAGNOSIS — R5383 Other fatigue: Secondary | ICD-10-CM | POA: Insufficient documentation

## 2018-06-27 DIAGNOSIS — Z7189 Other specified counseling: Secondary | ICD-10-CM

## 2018-06-27 MED ORDER — SODIUM CHLORIDE 0.9 % IV SOLN
Freq: Once | INTRAVENOUS | Status: AC
  Administered 2018-06-27: 09:00:00 via INTRAVENOUS
  Filled 2018-06-27: qty 250

## 2018-06-27 MED ORDER — HEPARIN SOD (PORK) LOCK FLUSH 100 UNIT/ML IV SOLN
500.0000 [IU] | Freq: Once | INTRAVENOUS | Status: AC | PRN
  Administered 2018-06-27: 500 [IU]
  Filled 2018-06-27: qty 5

## 2018-06-27 MED ORDER — SODIUM CHLORIDE 0.9 % IV SOLN
200.0000 mg | Freq: Once | INTRAVENOUS | Status: AC
  Administered 2018-06-27: 200 mg via INTRAVENOUS
  Filled 2018-06-27: qty 8

## 2018-06-27 MED ORDER — SODIUM CHLORIDE 0.9% FLUSH
10.0000 mL | INTRAVENOUS | Status: DC | PRN
  Administered 2018-06-27: 10 mL
  Filled 2018-06-27: qty 10

## 2018-06-27 NOTE — Patient Instructions (Signed)
[facility] Discharge Instructions for Patients Receiving Chemotherapy  Today you received the following chemotherapy agents: pembrolizumab Beryle Flock)  To help prevent nausea and vomiting after your treatment, we encourage you to take your nausea medication {as directed by your physician.    If you develop nausea and vomiting that is not controlled by your nausea medication, call the clinic.   BELOW ARE SYMPTOMS THAT SHOULD BE REPORTED IMMEDIATELY:  *FEVER GREATER THAN 100.5 F  *CHILLS WITH OR WITHOUT FEVER  NAUSEA AND VOMITING THAT IS NOT CONTROLLED WITH YOUR NAUSEA MEDICATION  *UNUSUAL SHORTNESS OF BREATH  *UNUSUAL BRUISING OR BLEEDING  TENDERNESS IN MOUTH AND THROAT WITH OR WITHOUT PRESENCE OF ULCERS  *URINARY PROBLEMS  *BOWEL PROBLEMS  UNUSUAL RASH Items with * indicate a potential emergency and should be followed up as soon as possible.  Feel free to call the clinic should you have any questions or concerns. The clinic phone number is [phone].  Please show the [facility] at check-in to the Emergency Department and triage nurse.  Pembrolizumab injection What is this medicine? PEMBROLIZUMAB (pem broe liz ue mab) is a monoclonal antibody. It is used to treat melanoma, head and neck cancer, Hodgkin lymphoma, non-small cell lung cancer, urothelial cancer, stomach cancer, and cancers that have a certain genetic condition. This medicine may be used for other purposes; ask your health care provider or pharmacist if you have questions. COMMON BRAND NAME(S): Keytruda What should I tell my health care provider before I take this medicine? They need to know if you have any of these conditions: -diabetes -immune system problems -inflammatory bowel disease -liver disease -lung or breathing disease -lupus -organ transplant -an unusual or allergic reaction to pembrolizumab, other medicines, foods, dyes, or preservatives -pregnant or trying to get  pregnant -breast-feeding How should I use this medicine? This medicine is for infusion into a vein. It is given by a health care professional in a hospital or clinic setting. A special MedGuide will be given to you before each treatment. Be sure to read this information carefully each time. Talk to your pediatrician regarding the use of this medicine in children. While this drug may be prescribed for selected conditions, precautions do apply. Overdosage: If you think you have taken too much of this medicine contact a poison control center or emergency room at once. NOTE: This medicine is only for you. Do not share this medicine with others. What if I miss a dose? It is important not to miss your dose. Call your doctor or health care professional if you are unable to keep an appointment. What may interact with this medicine? Interactions have not been studied. Give your health care provider a list of all the medicines, herbs, non-prescription drugs, or dietary supplements you use. Also tell them if you smoke, drink alcohol, or use illegal drugs. Some items may interact with your medicine. This list may not describe all possible interactions. Give your health care provider a list of all the medicines, herbs, non-prescription drugs, or dietary supplements you use. Also tell them if you smoke, drink alcohol, or use illegal drugs. Some items may interact with your medicine. What should I watch for while using this medicine? Your condition will be monitored carefully while you are receiving this medicine. You may need blood work done while you are taking this medicine. Do not become pregnant while taking this medicine or for 4 months after stopping it. Women should inform their doctor if they wish to become  pregnant or think they might be pregnant. There is a potential for serious side effects to an unborn child. Talk to your health care professional or pharmacist for more information. Do not breast-feed  an infant while taking this medicine or for 4 months after the last dose. What side effects may I notice from receiving this medicine? Side effects that you should report to your doctor or health care professional as soon as possible: -allergic reactions like skin rash, itching or hives, swelling of the face, lips, or tongue -bloody or black, tarry -breathing problems -changes in vision -chest pain -chills -constipation -cough -dizziness or feeling faint or lightheaded -fast or irregular heartbeat -fever -flushing -hair loss -low blood counts - this medicine may decrease the number of white blood cells, red blood cells and platelets. You may be at increased risk for infections and bleeding. -muscle pain -muscle weakness -persistent headache -signs and symptoms of high blood sugar such as dizziness; dry mouth; dry skin; fruity breath; nausea; stomach pain; increased hunger or thirst; increased urination -signs and symptoms of kidney injury like trouble passing urine or change in the amount of urine -signs and symptoms of liver injury like dark urine, light-colored stools, loss of appetite, nausea, right upper belly pain, yellowing of the eyes or skin -stomach pain -sweating -weight loss Side effects that usually do not require medical attention (report to your doctor or health care professional if they continue or are bothersome): -decreased appetite -diarrhea -tiredness This list may not describe all possible side effects. Call your doctor for medical advice about side effects. You may report side effects to FDA at 1-800-FDA-1088. Where should I keep my medicine? This drug is given in a hospital or clinic and will not be stored at home. NOTE: This sheet is a summary. It may not cover all possible information. If you have questions about this medicine, talk to your doctor, pharmacist, or health care provider.  2018 Elsevier/Gold Standard (2016-04-17 12:29:36)

## 2018-06-30 ENCOUNTER — Telehealth: Payer: Self-pay

## 2018-06-30 NOTE — Telephone Encounter (Signed)
Called to follow up after new treatment on Friday. She is doing well, with no complaints.

## 2018-07-18 ENCOUNTER — Inpatient Hospital Stay: Payer: BC Managed Care – PPO

## 2018-07-18 ENCOUNTER — Inpatient Hospital Stay (HOSPITAL_BASED_OUTPATIENT_CLINIC_OR_DEPARTMENT_OTHER): Payer: BC Managed Care – PPO | Admitting: Hematology and Oncology

## 2018-07-18 ENCOUNTER — Encounter: Payer: Self-pay | Admitting: Hematology and Oncology

## 2018-07-18 DIAGNOSIS — C530 Malignant neoplasm of endocervix: Secondary | ICD-10-CM | POA: Diagnosis not present

## 2018-07-18 DIAGNOSIS — Z79899 Other long term (current) drug therapy: Secondary | ICD-10-CM | POA: Diagnosis not present

## 2018-07-18 DIAGNOSIS — D500 Iron deficiency anemia secondary to blood loss (chronic): Secondary | ICD-10-CM

## 2018-07-18 DIAGNOSIS — C787 Secondary malignant neoplasm of liver and intrahepatic bile duct: Secondary | ICD-10-CM

## 2018-07-18 DIAGNOSIS — Z7189 Other specified counseling: Secondary | ICD-10-CM

## 2018-07-18 DIAGNOSIS — R030 Elevated blood-pressure reading, without diagnosis of hypertension: Secondary | ICD-10-CM | POA: Diagnosis not present

## 2018-07-18 DIAGNOSIS — R5383 Other fatigue: Secondary | ICD-10-CM

## 2018-07-18 LAB — CBC WITH DIFFERENTIAL/PLATELET
Abs Immature Granulocytes: 0.02 10*3/uL (ref 0.00–0.07)
Basophils Absolute: 0 10*3/uL (ref 0.0–0.1)
Basophils Relative: 1 %
Eosinophils Absolute: 0 10*3/uL (ref 0.0–0.5)
Eosinophils Relative: 1 %
HCT: 41.2 % (ref 36.0–46.0)
Hemoglobin: 13.6 g/dL (ref 12.0–15.0)
Immature Granulocytes: 0 %
Lymphocytes Relative: 17 %
Lymphs Abs: 1.1 10*3/uL (ref 0.7–4.0)
MCH: 29.4 pg (ref 26.0–34.0)
MCHC: 33 g/dL (ref 30.0–36.0)
MCV: 89.2 fL (ref 80.0–100.0)
MONO ABS: 0.5 10*3/uL (ref 0.1–1.0)
Monocytes Relative: 8 %
Neutro Abs: 4.9 10*3/uL (ref 1.7–7.7)
Neutrophils Relative %: 73 %
Platelets: 209 10*3/uL (ref 150–400)
RBC: 4.62 MIL/uL (ref 3.87–5.11)
RDW: 12.8 % (ref 11.5–15.5)
WBC: 6.6 10*3/uL (ref 4.0–10.5)
nRBC: 0 % (ref 0.0–0.2)

## 2018-07-18 LAB — COMPREHENSIVE METABOLIC PANEL
ALT: 21 U/L (ref 0–44)
AST: 29 U/L (ref 15–41)
Albumin: 4.2 g/dL (ref 3.5–5.0)
Alkaline Phosphatase: 149 U/L — ABNORMAL HIGH (ref 38–126)
Anion gap: 10 (ref 5–15)
BILIRUBIN TOTAL: 0.5 mg/dL (ref 0.3–1.2)
BUN: 10 mg/dL (ref 6–20)
CO2: 27 mmol/L (ref 22–32)
Calcium: 9.7 mg/dL (ref 8.9–10.3)
Chloride: 104 mmol/L (ref 98–111)
Creatinine, Ser: 0.76 mg/dL (ref 0.44–1.00)
GFR calc Af Amer: >60 mL/min (ref >60)
GFR calc non Af Amer: >60 mL/min (ref >60)
Glucose, Bld: 91 mg/dL (ref 70–99)
POTASSIUM: 3.6 mmol/L (ref 3.5–5.1)
Sodium: 141 mmol/L (ref 135–145)
Total Protein: 8 g/dL (ref 6.5–8.1)

## 2018-07-18 LAB — TSH: TSH: 2.351 u[IU]/mL (ref 0.308–3.960)

## 2018-07-18 MED ORDER — SODIUM CHLORIDE 0.9 % IV SOLN
200.0000 mg | Freq: Once | INTRAVENOUS | Status: AC
  Administered 2018-07-18: 200 mg via INTRAVENOUS
  Filled 2018-07-18: qty 8

## 2018-07-18 MED ORDER — SODIUM CHLORIDE 0.9 % IV SOLN
Freq: Once | INTRAVENOUS | Status: AC
  Administered 2018-07-18: 14:00:00 via INTRAVENOUS
  Filled 2018-07-18: qty 250

## 2018-07-18 MED ORDER — SODIUM CHLORIDE 0.9% FLUSH
10.0000 mL | Freq: Once | INTRAVENOUS | Status: AC
  Administered 2018-07-18: 10 mL
  Filled 2018-07-18: qty 10

## 2018-07-18 MED ORDER — SODIUM CHLORIDE 0.9% FLUSH
10.0000 mL | INTRAVENOUS | Status: DC | PRN
  Administered 2018-07-18: 10 mL
  Filled 2018-07-18: qty 10

## 2018-07-18 MED ORDER — HEPARIN SOD (PORK) LOCK FLUSH 100 UNIT/ML IV SOLN
500.0000 [IU] | Freq: Once | INTRAVENOUS | Status: AC | PRN
  Administered 2018-07-18: 500 [IU]
  Filled 2018-07-18: qty 5

## 2018-07-18 NOTE — Progress Notes (Signed)
[place] OFFICE PROGRESS NOTE  Patient Care Team: Avel Sensor, MD as PCP - General (Obstetrics and Gynecology)  ASSESSMENT & PLAN:  Cervical cancer ([place]) Overall, she tolerated pembrolizumab well I recommend minimum 3 cycles of treatment before repeat imaging study, probably around end of February 2020  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[facility]) Despite recurrence of cancer in her liver, she is not symptomatic Continue to observe closely  Elevated BP without diagnosis of hypertension Her blood pressure is stable.  She will continue amlodipine   No orders of the defined types were placed in this encounter.   INTERVAL HISTORY: Please see below for problem oriented charting. She returns for further follow-up She feels well Denies recent infection, fever or chills She denies abdominal bloating, pain or changes in bowel habits  SUMMARY OF ONCOLOGIC HISTORY: Oncology History   She tested positive for PD-L1 25% from liver biopsy     Cervical cancer ([place])   07/22/2017 Initial Diagnosis    The patient was examined in the wellness center.  Upon vaginal exam, friable mass of tissue presumably from the cervix was interpreted as poorly differentiated high-grade malignant neoplasm.  Follow-up workup with ultrasound of the uterus revealed multiple large uterine fibroids.    07/22/2017 Pathology Results    Biopsy confirmed poorly differentiated high-grade malignant neoplasm.    08/06/2017 Imaging    6.6 cm uterine mass in area of cervix and upper vagina, highly suspicious for cervical carcinoma.  Mild bilateral iliac lymphadenopathy, consistent with metastatic disease. No abdominal lymphadenopathy identified.  Multiple large liver masses, with areas of central necrosis or scarring, highly suspicious for liver metastases. Although unlikely, it is conceivable that some of these lesions could represent other etiology such as focal nodular hyperplasia or adenomas. Consider  further evaluation with PET-CT, abdomen MRI without and with contrast, or percutaneous needle biopsy.  Multiple large uterine fibroids, many showing cystic degeneration.  Bilateral cystic adnexal lesions with probably benign features. Differential diagnosis includes ovarian cysts, endometriomas, and hydrosalpinges. These could also be further assessed on PET-CT or MRI.  No evidence of thoracic metastatic disease.     08/09/2017 Tumor Marker    Patient's tumor was tested for the following markers: CA-125 Results of the tumor marker test revealed 79.2    08/14/2017 Pathology Results    Liver, needle/core biopsy, left hepatic lobe - METASTATIC POORLY DIFFERENTIATED CARCINOMA, SEE COMMENT. Microscopic Comment The tumor is poorly differentiated with abundant necrosis. Pancytokeratin, PAX-8, cytokeratin 7, and p16 are positive. There is weak non-specific staining with TTF-1, synaptophysin (very focal), p63 (very focal). Chromogranin, CD56, S100, desmin, SMA, ER, CD10, cytokeratin 10, cytokeratin 5/6, and cytokeratin 20 are negative. Thus, the immunoprofile is suggest of a gynecologic primary. Dr. Lyndon Code has reviewed the case.     08/21/2017 Procedure    Ultrasound and fluoroscopically guided right internal jugular single lumen power port catheter insertion. Tip in the SVC/RA junction. Catheter ready for use.    08/30/2017 -  Chemotherapy    The patient had received cisplatin and Taxol    09/12/2017 - 06/26/2018 Chemotherapy    The patient had bevacizumab (AVASTIN) 800 mg in sodium chloride 0.9 % 100 mL chemo infusion, 15 mg/kg = 800 mg, Intravenous,  Once, 10 of 10 cycles Dose modification: 15 mg/kg (original dose 15 mg/kg, Cycle 8, Reason: Provider Judgment), 15 mg/kg (original dose 15 mg/kg, Cycle 9, Reason: Other (see comments)) Administration: 800 mg (09/20/2017), 800 mg (11/01/2017), 800 mg (11/22/2017), 800 mg (12/13/2017), 800 mg (02/21/2018),  800 mg (04/04/2018), 1,000 mg (04/25/2018), 1,000 mg  (05/16/2018), 1,000 mg (06/06/2018)  for chemotherapy treatment.     10/24/2017 Imaging    Interval resolution of large cervical mass and bilateral iliac lymphadenopathy since previous study.  Significant decrease in size of diffuse liver metastases since prior study. No new or progressive metastatic disease identified.  Stable probably benign cystic lesion in right adnexa. Previously seen left adnexal cystic lesion has resolved since prior study.  Stable large uterine fibroids. Increased mild right hydronephrosis due to extrinsic compression of ureter by enlarged uterus.    02/20/2018 Imaging    Interval improvement in diffuse liver metastases since prior study. No new or progressive metastatic disease.  Stable large uterine fibroids.  No significant change in tubular cystic lesion in right adnexa, suspicious for hydrosalpinx or endometrioma.    06/05/2018 Imaging    CT AP W Contrast 06/05/18 IMPRESSION: New mild lymphadenopathy in left paraaortic region and porta hepatis, and slight increase in size of right external iliac lymph node, consistent with metastatic disease.  Increased soft tissue prominence in region of cervix and right vaginal cuff, consistent with local tumor progression.  Multiple liver metastases, with mild decrease in size of several metastatic lesions.  Stable uterine fibroids.    06/27/2018 -  Chemotherapy    The patient had Keytruda     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([place])   08/09/2017 Initial Diagnosis    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[facility])    09/12/2017 - 06/26/2018 Chemotherapy    The patient had bevacizumab (AVASTIN) 800 mg in sodium chloride 0.9 % 100 mL chemo infusion, 15 mg/kg = 800 mg, Intravenous,  Once, 10 of 10 cycles Dose modification: 15 mg/kg (original dose 15 mg/kg, Cycle 8, Reason: Provider Judgment), 15 mg/kg (original dose 15 mg/kg, Cycle 9, Reason: Other (see comments)) Administration: 800 mg (09/20/2017), 800 mg (11/01/2017), 800 mg (11/22/2017), 800 mg  (12/13/2017), 800 mg (02/21/2018), 800 mg (04/04/2018), 1,000 mg (04/25/2018), 1,000 mg (05/16/2018), 1,000 mg (06/06/2018)  for chemotherapy treatment.     06/27/2018 -  Chemotherapy    The patient had pembrolizumab (KEYTRUDA) 200 mg in sodium chloride 0.9 % 50 mL chemo infusion, 200 mg, Intravenous, Once, 2 of 4 cycles Administration: 200 mg (06/27/2018)  for chemotherapy treatment.      REVIEW OF SYSTEMS:   Constitutional: Denies fevers, chills or abnormal weight loss Eyes: Denies blurriness of vision Ears, nose, mouth, throat, and face: Denies mucositis or sore throat Respiratory: Denies cough, dyspnea or wheezes Cardiovascular: Denies palpitation, chest discomfort or lower extremity swelling Gastrointestinal:  Denies nausea, heartburn or change in bowel habits Skin: Denies abnormal skin rashes Lymphatics: Denies new lymphadenopathy or easy bruising Neurological:Denies numbness, tingling or new weaknesses Behavioral/Psych: Mood is stable, no new changes  All other systems were reviewed with the patient and are negative.  I have reviewed the past medical history, past surgical history, social history and family history with the patient and they are unchanged from previous note.  ALLERGIES:  is allergic to benadryl [diphenhydramine].  MEDICATIONS:  Current Outpatient Medications  Medication Sig Dispense Refill  . acetaminophen (TYLENOL) 325 MG tablet Take 650 mg by mouth every 6 (six) hours as needed.    . ALPRAZolam (XANAX) 0.5 MG tablet Take 1 tablet (0.5 mg total) by mouth 2 (two) times daily as needed for anxiety. 60 tablet 0  . amLODipine (NORVASC) 10 MG tablet TAKE 1 TABLET(10 MG) BY MOUTH DAILY 30 tablet 3  . magnesium oxide (MAG-OX) 400 (241.3  Mg) MG tablet Take 1 tablet (400 mg total) by mouth 2 (two) times daily. 60 tablet 6  . prochlorperazine (COMPAZINE) 10 MG tablet Take 1 tablet (10 mg total) by mouth every 6 (six) hours as needed for nausea or vomiting. 30 tablet 0  .  sertraline (ZOLOFT) 25 MG tablet Take 1 tablet (25 mg total) by mouth daily. 30 tablet 9   No current facility-administered medications for this visit.    Facility-Administered Medications Ordered in Other Visits  Medication Dose Route Frequency Provider Last Rate Last Dose  . 0.9 %  sodium chloride infusion   Intravenous Once Alvy Bimler, Refujio Haymer, MD      . heparin lock flush 100 unit/mL  500 Units Intracatheter Once PRN Alvy Bimler, Carmencita Cusic, MD      . pembrolizumab (KEYTRUDA) 200 mg in sodium chloride 0.9 % 50 mL chemo infusion  200 mg Intravenous Once Heath Lark, MD 116 mL/hr at 07/18/18 1438 200 mg at 07/18/18 1438  . sodium chloride flush (NS) 0.9 % injection 10 mL  10 mL Intracatheter PRN Alvy Bimler, Rip Hawes, MD        PHYSICAL EXAMINATION: ECOG PERFORMANCE STATUS: 0 - Asymptomatic  Vitals:   07/18/18 1303  BP: (!) 143/94  Pulse: 71  Resp: 18  Temp: 98.4 F (36.9 C)  SpO2: 100%   Filed Weights   07/18/18 1303  Weight: 140 lb 8 oz (63.7 kg)    GENERAL:alert, no distress and comfortable SKIN: skin color, texture, turgor are normal, no rashes or significant lesions EYES: normal, Conjunctiva are pink and non-injected, sclera clear OROPHARYNX:no exudate, no erythema and lips, buccal mucosa, and tongue normal  NECK: supple, thyroid normal size, non-tender, without nodularity LYMPH:  no palpable lymphadenopathy in the cervical, axillary or inguinal LUNGS: clear to auscultation and percussion with normal breathing effort HEART: regular rate & rhythm and no murmurs and no lower extremity edema ABDOMEN:abdomen soft, non-tender and normal bowel sounds Musculoskeletal:no cyanosis of digits and no clubbing  NEURO: alert & oriented x 3 with fluent speech, no focal motor/sensory deficits  LABORATORY DATA:  I have reviewed the data as listed    Component Value Date/Time   NA 141 07/18/2018 1156   K 3.6 07/18/2018 1156   CL 104 07/18/2018 1156   CO2 27 07/18/2018 1156   GLUCOSE 91 07/18/2018 1156   BUN  10 07/18/2018 1156   CREATININE 0.76 07/18/2018 1156   CREATININE 0.85 02/20/2018 0850   CALCIUM 9.7 07/18/2018 1156   PROT 8.0 07/18/2018 1156   ALBUMIN 4.2 07/18/2018 1156   AST 29 07/18/2018 1156   AST 22 02/20/2018 0850   ALT 21 07/18/2018 1156   ALT 19 02/20/2018 0850   ALKPHOS 149 (H) 07/18/2018 1156   BILITOT 0.5 07/18/2018 1156   BILITOT 0.6 02/20/2018 0850   GFRNONAA >60 07/18/2018 1156   GFRNONAA >60 02/20/2018 0850   GFRAA >60 07/18/2018 1156   GFRAA >60 02/20/2018 0850    No results found for: SPEP, UPEP  Lab Results  Component Value Date   WBC 6.6 07/18/2018   NEUTROABS 4.9 07/18/2018   HGB 13.6 07/18/2018   HCT 41.2 07/18/2018   MCV 89.2 07/18/2018   PLT 209 07/18/2018      Chemistry      Component Value Date/Time   NA 141 07/18/2018 1156   K 3.6 07/18/2018 1156   CL 104 07/18/2018 1156   CO2 27 07/18/2018 1156   BUN 10 07/18/2018 1156   CREATININE 0.76 07/18/2018 1156  CREATININE 0.85 02/20/2018 0850      Component Value Date/Time   CALCIUM 9.7 07/18/2018 1156   ALKPHOS 149 (H) 07/18/2018 1156   AST 29 07/18/2018 1156   AST 22 02/20/2018 0850   ALT 21 07/18/2018 1156   ALT 19 02/20/2018 0850   BILITOT 0.5 07/18/2018 1156   BILITOT 0.6 02/20/2018 0850      All questions were answered. The patient knows to call the clinic with any problems, questions or concerns. No barriers to learning was detected.  I spent 15 minutes counseling the patient face to face. The total time spent in the appointment was 20 minutes and more than 50% was on counseling and review of test results  Heath Lark, MD 07/18/2018 2:59 PM

## 2018-07-18 NOTE — Assessment & Plan Note (Signed)
Despite recurrence of cancer in her liver, she is not symptomatic Continue to observe closely

## 2018-07-18 NOTE — Assessment & Plan Note (Signed)
Overall, she tolerated pembrolizumab well I recommend minimum 3 cycles of treatment before repeat imaging study, probably around end of February 2020

## 2018-07-18 NOTE — Assessment & Plan Note (Signed)
Her blood pressure is stable.  She will continue amlodipine

## 2018-07-18 NOTE — Patient Instructions (Signed)
[facility] Discharge Instructions for Patients Receiving Chemotherapy  Today you received the following chemotherapy agents :  Keytruda.  To help prevent nausea and vomiting after your treatment, we encourage you to take your nausea medication as prescribed.   If you develop nausea and vomiting that is not controlled by your nausea medication, call the clinic.   BELOW ARE SYMPTOMS THAT SHOULD BE REPORTED IMMEDIATELY:  *FEVER GREATER THAN 100.5 F  *CHILLS WITH OR WITHOUT FEVER  NAUSEA AND VOMITING THAT IS NOT CONTROLLED WITH YOUR NAUSEA MEDICATION  *UNUSUAL SHORTNESS OF BREATH  *UNUSUAL BRUISING OR BLEEDING  TENDERNESS IN MOUTH AND THROAT WITH OR WITHOUT PRESENCE OF ULCERS  *URINARY PROBLEMS  *BOWEL PROBLEMS  UNUSUAL RASH Items with * indicate a potential emergency and should be followed up as soon as possible.  Feel free to call the clinic should you have any questions or concerns. The clinic phone number is [phone].  Please show the CHEMO ALERT CARD at check-in to the Emergency Department and triage nurse.  

## 2018-08-08 ENCOUNTER — Inpatient Hospital Stay: Payer: BC Managed Care – PPO

## 2018-08-08 ENCOUNTER — Inpatient Hospital Stay: Payer: BC Managed Care – PPO | Attending: Hematology and Oncology

## 2018-08-08 ENCOUNTER — Inpatient Hospital Stay (HOSPITAL_BASED_OUTPATIENT_CLINIC_OR_DEPARTMENT_OTHER): Payer: BC Managed Care – PPO | Admitting: Hematology and Oncology

## 2018-08-08 ENCOUNTER — Encounter: Payer: Self-pay | Admitting: Hematology and Oncology

## 2018-08-08 ENCOUNTER — Telehealth: Payer: Self-pay | Admitting: Hematology and Oncology

## 2018-08-08 DIAGNOSIS — Z79899 Other long term (current) drug therapy: Secondary | ICD-10-CM | POA: Diagnosis not present

## 2018-08-08 DIAGNOSIS — F411 Generalized anxiety disorder: Secondary | ICD-10-CM

## 2018-08-08 DIAGNOSIS — D500 Iron deficiency anemia secondary to blood loss (chronic): Secondary | ICD-10-CM

## 2018-08-08 DIAGNOSIS — R5383 Other fatigue: Secondary | ICD-10-CM

## 2018-08-08 DIAGNOSIS — C530 Malignant neoplasm of endocervix: Secondary | ICD-10-CM

## 2018-08-08 DIAGNOSIS — C787 Secondary malignant neoplasm of liver and intrahepatic bile duct: Secondary | ICD-10-CM

## 2018-08-08 DIAGNOSIS — R030 Elevated blood-pressure reading, without diagnosis of hypertension: Secondary | ICD-10-CM | POA: Diagnosis not present

## 2018-08-08 DIAGNOSIS — Z7189 Other specified counseling: Secondary | ICD-10-CM

## 2018-08-08 DIAGNOSIS — Z5112 Encounter for antineoplastic immunotherapy: Secondary | ICD-10-CM | POA: Insufficient documentation

## 2018-08-08 LAB — CBC WITH DIFFERENTIAL/PLATELET
Abs Immature Granulocytes: 0.02 10*3/uL (ref 0.00–0.07)
Basophils Absolute: 0 10*3/uL (ref 0.0–0.1)
Basophils Relative: 0 %
Eosinophils Absolute: 0 10*3/uL (ref 0.0–0.5)
Eosinophils Relative: 0 %
HCT: 38.2 % (ref 36.0–46.0)
Hemoglobin: 12.8 g/dL (ref 12.0–15.0)
Immature Granulocytes: 0 %
Lymphocytes Relative: 14 %
Lymphs Abs: 1 10*3/uL (ref 0.7–4.0)
MCH: 29.6 pg (ref 26.0–34.0)
MCHC: 33.5 g/dL (ref 30.0–36.0)
MCV: 88.2 fL (ref 80.0–100.0)
Monocytes Absolute: 0.6 10*3/uL (ref 0.1–1.0)
Monocytes Relative: 8 %
Neutro Abs: 5.4 10*3/uL (ref 1.7–7.7)
Neutrophils Relative %: 78 %
Platelets: 172 10*3/uL (ref 150–400)
RBC: 4.33 MIL/uL (ref 3.87–5.11)
RDW: 13.1 % (ref 11.5–15.5)
WBC: 7.2 10*3/uL (ref 4.0–10.5)
nRBC: 0 % (ref 0.0–0.2)

## 2018-08-08 LAB — COMPREHENSIVE METABOLIC PANEL
ALT: 9 U/L (ref 0–44)
ANION GAP: 11 (ref 5–15)
AST: 24 U/L (ref 15–41)
Albumin: 4.1 g/dL (ref 3.5–5.0)
Alkaline Phosphatase: 137 U/L — ABNORMAL HIGH (ref 38–126)
BUN: 11 mg/dL (ref 6–20)
CALCIUM: 9.5 mg/dL (ref 8.9–10.3)
CO2: 26 mmol/L (ref 22–32)
Chloride: 106 mmol/L (ref 98–111)
Creatinine, Ser: 0.72 mg/dL (ref 0.44–1.00)
GFR calc Af Amer: >60 mL/min (ref >60)
GFR calc non Af Amer: >60 mL/min (ref >60)
Glucose, Bld: 106 mg/dL — ABNORMAL HIGH (ref 70–99)
Potassium: 3.7 mmol/L (ref 3.5–5.1)
Sodium: 143 mmol/L (ref 135–145)
Total Bilirubin: 0.6 mg/dL (ref 0.3–1.2)
Total Protein: 7.8 g/dL (ref 6.5–8.1)

## 2018-08-08 LAB — TSH: TSH: 1.143 u[IU]/mL (ref 0.308–3.960)

## 2018-08-08 MED ORDER — SODIUM CHLORIDE 0.9% FLUSH
10.0000 mL | Freq: Once | INTRAVENOUS | Status: AC
  Administered 2018-08-08: 10 mL
  Filled 2018-08-08: qty 10

## 2018-08-08 MED ORDER — HEPARIN SOD (PORK) LOCK FLUSH 100 UNIT/ML IV SOLN
500.0000 [IU] | Freq: Once | INTRAVENOUS | Status: AC | PRN
  Administered 2018-08-08: 500 [IU]
  Filled 2018-08-08: qty 5

## 2018-08-08 MED ORDER — SODIUM CHLORIDE 0.9% FLUSH
10.0000 mL | INTRAVENOUS | Status: DC | PRN
  Administered 2018-08-08: 10 mL
  Filled 2018-08-08: qty 10

## 2018-08-08 MED ORDER — SODIUM CHLORIDE 0.9 % IV SOLN
Freq: Once | INTRAVENOUS | Status: AC
  Administered 2018-08-08: 12:00:00 via INTRAVENOUS
  Filled 2018-08-08: qty 250

## 2018-08-08 MED ORDER — AMLODIPINE BESYLATE 10 MG PO TABS: ORAL_TABLET | ORAL | 3 refills | Status: DC

## 2018-08-08 MED ORDER — SODIUM CHLORIDE 0.9 % IV SOLN
200.0000 mg | Freq: Once | INTRAVENOUS | Status: AC
  Administered 2018-08-08: 200 mg via INTRAVENOUS
  Filled 2018-08-08: qty 8

## 2018-08-08 MED ORDER — ALPRAZOLAM 0.5 MG PO TABS: 0.5000 mg | ORAL_TABLET | Freq: Every evening | ORAL | 0 refills | Status: DC | PRN

## 2018-08-08 NOTE — Assessment & Plan Note (Signed)
She has significant anxiety but denies depression I recommend Zoloft along with Xanax as needed. We discussed the risk and benefits of each treatment and she agreed to proceed. I refilled her prescription Xanax today

## 2018-08-08 NOTE — Progress Notes (Signed)
[facility] OFFICE PROGRESS NOTE  Patient Care Team: Avel Sensor, MD as PCP - General (Obstetrics and Gynecology)  ASSESSMENT & PLAN:  Cervical cancer ([place]) Overall, she tolerated pembrolizumab well I recommend minimum 3 cycles of treatment before repeat imaging study, probably around end of February 2020  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[facility]) Despite recurrence of cancer in her liver, she is not symptomatic Continue to observe closely  Generalized anxiety disorder She has significant anxiety but denies depression I recommend Zoloft along with Xanax as needed. We discussed the risk and benefits of each treatment and she agreed to proceed. I refilled her prescription Xanax today  Elevated BP without diagnosis of hypertension Her blood pressure has started to trend down since discontinuation of bevacizumab. If her blood pressure remain normal in the next visit, I would consider discontinuation of amlodipine.   No orders of the defined types were placed in this encounter.   INTERVAL HISTORY: Please see below for problem oriented charting. She returns for further follow-up She tolerated recent treatment well No recent abdominal pain She has some mild constipation, resolved with laxative She continues to have intermittent anxiety but well controlled with her current prescription medicine She denies recent headache, leg swelling or dizziness  SUMMARY OF ONCOLOGIC HISTORY: Oncology History   She tested positive for PD-L1 25% from liver biopsy     Cervical cancer ([place])   07/22/2017 Initial Diagnosis    The patient was examined in the wellness center.  Upon vaginal exam, friable mass of tissue presumably from the cervix was interpreted as poorly differentiated high-grade malignant neoplasm.  Follow-up workup with ultrasound of the uterus revealed multiple large uterine fibroids.    07/22/2017 Pathology Results    Biopsy confirmed poorly differentiated high-grade  malignant neoplasm.    08/06/2017 Imaging    6.6 cm uterine mass in area of cervix and upper vagina, highly suspicious for cervical carcinoma.  Mild bilateral iliac lymphadenopathy, consistent with metastatic disease. No abdominal lymphadenopathy identified.  Multiple large liver masses, with areas of central necrosis or scarring, highly suspicious for liver metastases. Although unlikely, it is conceivable that some of these lesions could represent other etiology such as focal nodular hyperplasia or adenomas. Consider further evaluation with PET-CT, abdomen MRI without and with contrast, or percutaneous needle biopsy.  Multiple large uterine fibroids, many showing cystic degeneration.  Bilateral cystic adnexal lesions with probably benign features. Differential diagnosis includes ovarian cysts, endometriomas, and hydrosalpinges. These could also be further assessed on PET-CT or MRI.  No evidence of thoracic metastatic disease.     08/09/2017 Tumor Marker    Patient's tumor was tested for the following markers: CA-125 Results of the tumor marker test revealed 79.2    08/14/2017 Pathology Results    Liver, needle/core biopsy, left hepatic lobe - METASTATIC POORLY DIFFERENTIATED CARCINOMA, SEE COMMENT. Microscopic Comment The tumor is poorly differentiated with abundant necrosis. Pancytokeratin, PAX-8, cytokeratin 7, and p16 are positive. There is weak non-specific staining with TTF-1, synaptophysin (very focal), p63 (very focal). Chromogranin, CD56, S100, desmin, SMA, ER, CD10, cytokeratin 10, cytokeratin 5/6, and cytokeratin 20 are negative. Thus, the immunoprofile is suggest of a gynecologic primary. Dr. Lyndon Code has reviewed the case.     08/21/2017 Procedure    Ultrasound and fluoroscopically guided right internal jugular single lumen power port catheter insertion. Tip in the SVC/RA junction. Catheter ready for use.    08/30/2017 -  Chemotherapy    The patient had received cisplatin and  Taxol  09/12/2017 - 06/26/2018 Chemotherapy    The patient had bevacizumab (AVASTIN) 800 mg in sodium chloride 0.9 % 100 mL chemo infusion, 15 mg/kg = 800 mg, Intravenous,  Once, 10 of 10 cycles Dose modification: 15 mg/kg (original dose 15 mg/kg, Cycle 8, Reason: Provider Judgment), 15 mg/kg (original dose 15 mg/kg, Cycle 9, Reason: Other (see comments)) Administration: 800 mg (09/20/2017), 800 mg (11/01/2017), 800 mg (11/22/2017), 800 mg (12/13/2017), 800 mg (02/21/2018), 800 mg (04/04/2018), 1,000 mg (04/25/2018), 1,000 mg (05/16/2018), 1,000 mg (06/06/2018)  for chemotherapy treatment.     10/24/2017 Imaging    Interval resolution of large cervical mass and bilateral iliac lymphadenopathy since previous study.  Significant decrease in size of diffuse liver metastases since prior study. No new or progressive metastatic disease identified.  Stable probably benign cystic lesion in right adnexa. Previously seen left adnexal cystic lesion has resolved since prior study.  Stable large uterine fibroids. Increased mild right hydronephrosis due to extrinsic compression of ureter by enlarged uterus.    02/20/2018 Imaging    Interval improvement in diffuse liver metastases since prior study. No new or progressive metastatic disease.  Stable large uterine fibroids.  No significant change in tubular cystic lesion in right adnexa, suspicious for hydrosalpinx or endometrioma.    06/05/2018 Imaging    CT AP W Contrast 06/05/18 IMPRESSION: New mild lymphadenopathy in left paraaortic region and porta hepatis, and slight increase in size of right external iliac lymph node, consistent with metastatic disease.  Increased soft tissue prominence in region of cervix and right vaginal cuff, consistent with local tumor progression.  Multiple liver metastases, with mild decrease in size of several metastatic lesions.  Stable uterine fibroids.    06/27/2018 -  Chemotherapy    The patient had Keytruda      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([place])   08/09/2017 Initial Diagnosis    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[facility])    09/12/2017 - 06/26/2018 Chemotherapy    The patient had bevacizumab (AVASTIN) 800 mg in sodium chloride 0.9 % 100 mL chemo infusion, 15 mg/kg = 800 mg, Intravenous,  Once, 10 of 10 cycles Dose modification: 15 mg/kg (original dose 15 mg/kg, Cycle 8, Reason: Provider Judgment), 15 mg/kg (original dose 15 mg/kg, Cycle 9, Reason: Other (see comments)) Administration: 800 mg (09/20/2017), 800 mg (11/01/2017), 800 mg (11/22/2017), 800 mg (12/13/2017), 800 mg (02/21/2018), 800 mg (04/04/2018), 1,000 mg (04/25/2018), 1,000 mg (05/16/2018), 1,000 mg (06/06/2018)  for chemotherapy treatment.     06/27/2018 -  Chemotherapy    The patient had pembrolizumab (KEYTRUDA) 200 mg in sodium chloride 0.9 % 50 mL chemo infusion, 200 mg, Intravenous, Once, 3 of 4 cycles Administration: 200 mg (06/27/2018), 200 mg (07/18/2018), 200 mg (08/08/2018)  for chemotherapy treatment.      REVIEW OF SYSTEMS:   Constitutional: Denies fevers, chills or abnormal weight loss Eyes: Denies blurriness of vision Ears, nose, mouth, throat, and face: Denies mucositis or sore throat Respiratory: Denies cough, dyspnea or wheezes Cardiovascular: Denies palpitation, chest discomfort or lower extremity swelling Gastrointestinal:  Denies nausea, heartburn or change in bowel habits Skin: Denies abnormal skin rashes Lymphatics: Denies new lymphadenopathy or easy bruising Neurological:Denies numbness, tingling or new weaknesses Behavioral/Psych: Mood is stable, no new changes  All other systems were reviewed with the patient and are negative.  I have reviewed the past medical history, past surgical history, social history and family history with the patient and they are unchanged from previous note.  ALLERGIES:  is allergic to benadryl [diphenhydramine].  MEDICATIONS:  Current Outpatient Medications  Medication Sig Dispense Refill  . acetaminophen  (TYLENOL) 325 MG tablet Take 650 mg by mouth every 6 (six) hours as needed.    . ALPRAZolam (XANAX) 0.5 MG tablet Take 1 tablet (0.5 mg total) by mouth at bedtime as needed for anxiety. 60 tablet 0  . amLODipine (NORVASC) 10 MG tablet TAKE 1 TABLET(10 MG) BY MOUTH DAILY 30 tablet 3  . magnesium oxide (MAG-OX) 400 (241.3 Mg) MG tablet Take 1 tablet (400 mg total) by mouth 2 (two) times daily. 60 tablet 6  . prochlorperazine (COMPAZINE) 10 MG tablet Take 1 tablet (10 mg total) by mouth every 6 (six) hours as needed for nausea or vomiting. 30 tablet 0  . sertraline (ZOLOFT) 25 MG tablet Take 1 tablet (25 mg total) by mouth daily. 30 tablet 9   No current facility-administered medications for this visit.    Facility-Administered Medications Ordered in Other Visits  Medication Dose Route Frequency Provider Last Rate Last Dose  . 0.9 %  sodium chloride infusion   Intravenous Once Alvy Bimler, Elynor Kallenberger, MD        PHYSICAL EXAMINATION: ECOG PERFORMANCE STATUS: 0 - Asymptomatic  Vitals:   08/08/18 1049  BP: 126/72  Pulse: 95  Resp: 18  Temp: 98.4 F (36.9 C)  SpO2: 100%   Filed Weights   08/08/18 1049  Weight: 138 lb (62.6 kg)    GENERAL:alert, no distress and comfortable SKIN: skin color, texture, turgor are normal, no rashes or significant lesions EYES: normal, Conjunctiva are pink and non-injected, sclera clear OROPHARYNX:no exudate, no erythema and lips, buccal mucosa, and tongue normal  NECK: supple, thyroid normal size, non-tender, without nodularity LYMPH:  no palpable lymphadenopathy in the cervical, axillary or inguinal LUNGS: clear to auscultation and percussion with normal breathing effort HEART: regular rate & rhythm and no murmurs and no lower extremity edema ABDOMEN:abdomen soft, non-tender and normal bowel sounds Musculoskeletal:no cyanosis of digits and no clubbing  NEURO: alert & oriented x 3 with fluent speech, no focal motor/sensory deficits  LABORATORY DATA:  I have  reviewed the data as listed    Component Value Date/Time   NA 143 08/08/2018 1030   K 3.7 08/08/2018 1030   CL 106 08/08/2018 1030   CO2 26 08/08/2018 1030   GLUCOSE 106 (H) 08/08/2018 1030   BUN 11 08/08/2018 1030   CREATININE 0.72 08/08/2018 1030   CREATININE 0.85 02/20/2018 0850   CALCIUM 9.5 08/08/2018 1030   PROT 7.8 08/08/2018 1030   ALBUMIN 4.1 08/08/2018 1030   AST 24 08/08/2018 1030   AST 22 02/20/2018 0850   ALT 9 08/08/2018 1030   ALT 19 02/20/2018 0850   ALKPHOS 137 (H) 08/08/2018 1030   BILITOT 0.6 08/08/2018 1030   BILITOT 0.6 02/20/2018 0850   GFRNONAA >60 08/08/2018 1030   GFRNONAA >60 02/20/2018 0850   GFRAA >60 08/08/2018 1030   GFRAA >60 02/20/2018 0850    No results found for: SPEP, UPEP  Lab Results  Component Value Date   WBC 7.2 08/08/2018   NEUTROABS 5.4 08/08/2018   HGB 12.8 08/08/2018   HCT 38.2 08/08/2018   MCV 88.2 08/08/2018   PLT 172 08/08/2018      Chemistry      Component Value Date/Time   NA 143 08/08/2018 1030   K 3.7 08/08/2018 1030   CL 106 08/08/2018 1030   CO2 26 08/08/2018 1030   BUN 11 08/08/2018 1030   CREATININE 0.72 08/08/2018 1030  CREATININE 0.85 02/20/2018 0850      Component Value Date/Time   CALCIUM 9.5 08/08/2018 1030   ALKPHOS 137 (H) 08/08/2018 1030   AST 24 08/08/2018 1030   AST 22 02/20/2018 0850   ALT 9 08/08/2018 1030   ALT 19 02/20/2018 0850   BILITOT 0.6 08/08/2018 1030   BILITOT 0.6 02/20/2018 0850     All questions were answered. The patient knows to call the clinic with any problems, questions or concerns. No barriers to learning was detected.  I spent 15 minutes counseling the patient face to face. The total time spent in the appointment was 20 minutes and more than 50% was on counseling and review of test results  Heath Lark, MD 08/08/2018 5:32 PM

## 2018-08-08 NOTE — Assessment & Plan Note (Signed)
Her blood pressure has started to trend down since discontinuation of bevacizumab. If her blood pressure remain normal in the next visit, I would consider discontinuation of amlodipine.

## 2018-08-08 NOTE — Telephone Encounter (Signed)
No los °

## 2018-08-08 NOTE — Patient Instructions (Signed)
[facility] Discharge Instructions for Patients Receiving Chemotherapy  Today you received the following chemotherapy agents:  Keytruda.  To help prevent nausea and vomiting after your treatment, we encourage you to take your nausea medication as directed.   If you develop nausea and vomiting that is not controlled by your nausea medication, call the clinic.   BELOW ARE SYMPTOMS THAT SHOULD BE REPORTED IMMEDIATELY:  *FEVER GREATER THAN 100.5 F  *CHILLS WITH OR WITHOUT FEVER  NAUSEA AND VOMITING THAT IS NOT CONTROLLED WITH YOUR NAUSEA MEDICATION  *UNUSUAL SHORTNESS OF BREATH  *UNUSUAL BRUISING OR BLEEDING  TENDERNESS IN MOUTH AND THROAT WITH OR WITHOUT PRESENCE OF ULCERS  *URINARY PROBLEMS  *BOWEL PROBLEMS  UNUSUAL RASH Items with * indicate a potential emergency and should be followed up as soon as possible.  Feel free to call the clinic should you have any questions or concerns. The clinic phone number is [phone].  Please show the CHEMO ALERT CARD at check-in to the Emergency Department and triage nurse.    

## 2018-08-08 NOTE — Assessment & Plan Note (Signed)
Overall, she tolerated pembrolizumab well I recommend minimum 3 cycles of treatment before repeat imaging study, probably around end of February 2020

## 2018-08-08 NOTE — Assessment & Plan Note (Signed)
Despite recurrence of cancer in her liver, she is not symptomatic Continue to observe closely

## 2018-08-21 NOTE — Progress Notes (Signed)
Disability forms completed and taken to front desk of cancer center for pick up per patient request.

## 2018-08-29 ENCOUNTER — Inpatient Hospital Stay: Payer: BC Managed Care – PPO | Attending: Hematology and Oncology

## 2018-08-29 ENCOUNTER — Inpatient Hospital Stay: Payer: BC Managed Care – PPO

## 2018-08-29 ENCOUNTER — Telehealth: Payer: Self-pay | Admitting: Hematology and Oncology

## 2018-08-29 ENCOUNTER — Inpatient Hospital Stay (HOSPITAL_BASED_OUTPATIENT_CLINIC_OR_DEPARTMENT_OTHER): Payer: BC Managed Care – PPO | Admitting: Hematology and Oncology

## 2018-08-29 ENCOUNTER — Encounter: Payer: Self-pay | Admitting: Hematology and Oncology

## 2018-08-29 VITALS — BP 119/71 | HR 109 | Temp 98.0°F | Resp 18 | Ht 64.0 in | Wt 138.8 lb

## 2018-08-29 VITALS — HR 75

## 2018-08-29 DIAGNOSIS — D500 Iron deficiency anemia secondary to blood loss (chronic): Secondary | ICD-10-CM

## 2018-08-29 DIAGNOSIS — C530 Malignant neoplasm of endocervix: Secondary | ICD-10-CM | POA: Insufficient documentation

## 2018-08-29 DIAGNOSIS — Z7189 Other specified counseling: Secondary | ICD-10-CM

## 2018-08-29 DIAGNOSIS — C787 Secondary malignant neoplasm of liver and intrahepatic bile duct: Secondary | ICD-10-CM

## 2018-08-29 DIAGNOSIS — R030 Elevated blood-pressure reading, without diagnosis of hypertension: Secondary | ICD-10-CM

## 2018-08-29 DIAGNOSIS — Z79899 Other long term (current) drug therapy: Secondary | ICD-10-CM | POA: Diagnosis not present

## 2018-08-29 DIAGNOSIS — R5383 Other fatigue: Secondary | ICD-10-CM

## 2018-08-29 DIAGNOSIS — Z5111 Encounter for antineoplastic chemotherapy: Secondary | ICD-10-CM | POA: Insufficient documentation

## 2018-08-29 DIAGNOSIS — C775 Secondary and unspecified malignant neoplasm of intrapelvic lymph nodes: Secondary | ICD-10-CM | POA: Diagnosis not present

## 2018-08-29 DIAGNOSIS — Z7689 Persons encountering health services in other specified circumstances: Secondary | ICD-10-CM | POA: Diagnosis not present

## 2018-08-29 LAB — CBC WITH DIFFERENTIAL/PLATELET
Abs Immature Granulocytes: 0.02 10*3/uL (ref 0.00–0.07)
Basophils Absolute: 0 10*3/uL (ref 0.0–0.1)
Basophils Relative: 0 %
Eosinophils Absolute: 0 10*3/uL (ref 0.0–0.5)
Eosinophils Relative: 0 %
HCT: 38.1 % (ref 36.0–46.0)
Hemoglobin: 12.4 g/dL (ref 12.0–15.0)
Immature Granulocytes: 0 %
LYMPHS PCT: 13 %
Lymphs Abs: 1 10*3/uL (ref 0.7–4.0)
MCH: 29 pg (ref 26.0–34.0)
MCHC: 32.5 g/dL (ref 30.0–36.0)
MCV: 89.2 fL (ref 80.0–100.0)
Monocytes Absolute: 0.6 10*3/uL (ref 0.1–1.0)
Monocytes Relative: 7 %
Neutro Abs: 6.1 10*3/uL (ref 1.7–7.7)
Neutrophils Relative %: 80 %
Platelets: 162 10*3/uL (ref 150–400)
RBC: 4.27 MIL/uL (ref 3.87–5.11)
RDW: 13.6 % (ref 11.5–15.5)
WBC: 7.7 10*3/uL (ref 4.0–10.5)
nRBC: 0 % (ref 0.0–0.2)

## 2018-08-29 LAB — COMPREHENSIVE METABOLIC PANEL
ALT: 20 U/L (ref 0–44)
AST: 48 U/L — ABNORMAL HIGH (ref 15–41)
Albumin: 4 g/dL (ref 3.5–5.0)
Alkaline Phosphatase: 135 U/L — ABNORMAL HIGH (ref 38–126)
Anion gap: 11 (ref 5–15)
BUN: 10 mg/dL (ref 6–20)
CO2: 26 mmol/L (ref 22–32)
Calcium: 9.2 mg/dL (ref 8.9–10.3)
Chloride: 104 mmol/L (ref 98–111)
Creatinine, Ser: 0.75 mg/dL (ref 0.44–1.00)
GFR calc Af Amer: >60 mL/min (ref >60)
GFR calc non Af Amer: >60 mL/min (ref >60)
Glucose, Bld: 138 mg/dL — ABNORMAL HIGH (ref 70–99)
Potassium: 3.3 mmol/L — ABNORMAL LOW (ref 3.5–5.1)
Sodium: 141 mmol/L (ref 135–145)
Total Bilirubin: 0.7 mg/dL (ref 0.3–1.2)
Total Protein: 7.3 g/dL (ref 6.5–8.1)

## 2018-08-29 LAB — TSH: TSH: 0.971 u[IU]/mL (ref 0.308–3.960)

## 2018-08-29 MED ORDER — SODIUM CHLORIDE 0.9 % IV SOLN
200.0000 mg | Freq: Once | INTRAVENOUS | Status: AC
  Administered 2018-08-29: 200 mg via INTRAVENOUS
  Filled 2018-08-29: qty 8

## 2018-08-29 MED ORDER — SODIUM CHLORIDE 0.9% FLUSH
10.0000 mL | INTRAVENOUS | Status: DC | PRN
  Administered 2018-08-29: 10 mL
  Filled 2018-08-29: qty 10

## 2018-08-29 MED ORDER — AMLODIPINE BESYLATE 5 MG PO TABS: 5.0000 mg | ORAL_TABLET | Freq: Every day | ORAL | Status: DC

## 2018-08-29 MED ORDER — SODIUM CHLORIDE 0.9 % IV SOLN
Freq: Once | INTRAVENOUS | Status: AC
  Administered 2018-08-29: 10:00:00 via INTRAVENOUS
  Filled 2018-08-29: qty 250

## 2018-08-29 MED ORDER — SODIUM CHLORIDE 0.9% FLUSH
10.0000 mL | Freq: Once | INTRAVENOUS | Status: AC
  Administered 2018-08-29: 10 mL
  Filled 2018-08-29: qty 10

## 2018-08-29 MED ORDER — HEPARIN SOD (PORK) LOCK FLUSH 100 UNIT/ML IV SOLN
500.0000 [IU] | Freq: Once | INTRAVENOUS | Status: AC | PRN
  Administered 2018-08-29: 500 [IU]
  Filled 2018-08-29: qty 5

## 2018-08-29 NOTE — Assessment & Plan Note (Signed)
Despite recurrence of cancer in her liver, she is not symptomatic Continue to observe closely

## 2018-08-29 NOTE — Assessment & Plan Note (Signed)
So far, she tolerated immunotherapy without major side effects We will proceed with cycle 4 today Before cycle 5, I plan to repeat CT imaging at the end of the month to assess response to therapy

## 2018-08-29 NOTE — Telephone Encounter (Signed)
Gave avs and calendar ° °

## 2018-08-29 NOTE — Progress Notes (Signed)
Ok to proceed with treatment today per DR. Alvy Bimler. High Potassium food list given to patient and discussed. Patient verbalized an understanding.

## 2018-08-29 NOTE — Assessment & Plan Note (Signed)
Since discontinuation of bevacizumab, her blood pressure is trending down I recommend reducing amlodipine to 5 mg daily If her blood pressure remain low in the next visit, we will discontinue that permanently.

## 2018-08-29 NOTE — Patient Instructions (Signed)
[facility] Discharge Instructions for Patients Receiving Chemotherapy  Today you received the following chemotherapy agents:  Keytruda.  To help prevent nausea and vomiting after your treatment, we encourage you to take your nausea medication as directed.   If you develop nausea and vomiting that is not controlled by your nausea medication, call the clinic.   BELOW ARE SYMPTOMS THAT SHOULD BE REPORTED IMMEDIATELY:  *FEVER GREATER THAN 100.5 F  *CHILLS WITH OR WITHOUT FEVER  NAUSEA AND VOMITING THAT IS NOT CONTROLLED WITH YOUR NAUSEA MEDICATION  *UNUSUAL SHORTNESS OF BREATH  *UNUSUAL BRUISING OR BLEEDING  TENDERNESS IN MOUTH AND THROAT WITH OR WITHOUT PRESENCE OF ULCERS  *URINARY PROBLEMS  *BOWEL PROBLEMS  UNUSUAL RASH Items with * indicate a potential emergency and should be followed up as soon as possible.  Feel free to call the clinic should you have any questions or concerns. The clinic phone number is [phone].  Please show the CHEMO ALERT CARD at check-in to the Emergency Department and triage nurse.    

## 2018-08-29 NOTE — Progress Notes (Signed)
[place] OFFICE PROGRESS NOTE  Patient Care Team: Avel Sensor, MD as PCP - General (Obstetrics and Gynecology)  ASSESSMENT & PLAN:  Cervical cancer [facility]) So far, she tolerated immunotherapy without major side effects We will proceed with cycle 4 today Before cycle 5, I plan to repeat CT imaging at the end of the month to assess response to therapy  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[facility]) Despite recurrence of cancer in her liver, she is not symptomatic Continue to observe closely  Elevated BP without diagnosis of hypertension Since discontinuation of bevacizumab, her blood pressure is trending down I recommend reducing amlodipine to 5 mg daily If her blood pressure remain low in the next visit, we will discontinue that permanently.   Orders Placed This Encounter  Procedures  . CT ABDOMEN PELVIS W CONTRAST    Standing Status:   Future    Standing Expiration Date:   08/30/2019    Order Specific Question:   If indicated for the ordered procedure, I authorize the administration of contrast media per Radiology protocol    Answer:   Yes    Order Specific Question:   Preferred imaging location?    Answer:   [facility]    Order Specific Question:   Radiology Contrast Protocol - do NOT remove file path    Answer:   [email or web address]    Order Specific Question:   Is patient pregnant?    Answer:   No    INTERVAL HISTORY: Please see below for problem oriented charting. She returns for cycle 4 of chemotherapy She feels well Denies side effects of treatment such as nausea vomiting She has chronic constipation, resolved with laxative Denies abdominal pain  SUMMARY OF ONCOLOGIC HISTORY: Oncology History   She tested positive for PD-L1 25% from liver biopsy     Cervical cancer ([place])   07/22/2017 Initial Diagnosis    The patient was examined in the wellness center.  Upon vaginal exam, friable mass of tissue presumably from the cervix  was interpreted as poorly differentiated high-grade malignant neoplasm.  Follow-up workup with ultrasound of the uterus revealed multiple large uterine fibroids.    07/22/2017 Pathology Results    Biopsy confirmed poorly differentiated high-grade malignant neoplasm.    08/06/2017 Imaging    6.6 cm uterine mass in area of cervix and upper vagina, highly suspicious for cervical carcinoma.  Mild bilateral iliac lymphadenopathy, consistent with metastatic disease. No abdominal lymphadenopathy identified.  Multiple large liver masses, with areas of central necrosis or scarring, highly suspicious for liver metastases. Although unlikely, it is conceivable that some of these lesions could represent other etiology such as focal nodular hyperplasia or adenomas. Consider further evaluation with PET-CT, abdomen MRI without and with contrast, or percutaneous needle biopsy.  Multiple large uterine fibroids, many showing cystic degeneration.  Bilateral cystic adnexal lesions with probably benign features. Differential diagnosis includes ovarian cysts, endometriomas, and hydrosalpinges. These could also be further assessed on PET-CT or MRI.  No evidence of thoracic metastatic disease.     08/09/2017 Tumor Marker    Patient's tumor was tested for the following markers: CA-125 Results of the tumor marker test revealed 79.2    08/14/2017 Pathology Results    Liver, needle/core biopsy, left hepatic lobe - METASTATIC POORLY DIFFERENTIATED CARCINOMA, SEE COMMENT. Microscopic Comment The tumor is poorly differentiated with abundant necrosis. Pancytokeratin, PAX-8, cytokeratin 7, and p16 are positive. There is weak non-specific staining with TTF-1, synaptophysin (very focal), p63 (very focal). Chromogranin, CD56, S100,  desmin, SMA, ER, CD10, cytokeratin 10, cytokeratin 5/6, and cytokeratin 20 are negative. Thus, the immunoprofile is suggest of a gynecologic primary. Dr. Lyndon Code has reviewed the case.      08/21/2017 Procedure    Ultrasound and fluoroscopically guided right internal jugular single lumen power port catheter insertion. Tip in the SVC/RA junction. Catheter ready for use.    08/30/2017 -  Chemotherapy    The patient had received cisplatin and Taxol    09/12/2017 - 06/26/2018 Chemotherapy    The patient had bevacizumab (AVASTIN) 800 mg in sodium chloride 0.9 % 100 mL chemo infusion, 15 mg/kg = 800 mg, Intravenous,  Once, 10 of 10 cycles Dose modification: 15 mg/kg (original dose 15 mg/kg, Cycle 8, Reason: Provider Judgment), 15 mg/kg (original dose 15 mg/kg, Cycle 9, Reason: Other (see comments)) Administration: 800 mg (09/20/2017), 800 mg (11/01/2017), 800 mg (11/22/2017), 800 mg (12/13/2017), 800 mg (02/21/2018), 800 mg (04/04/2018), 1,000 mg (04/25/2018), 1,000 mg (05/16/2018), 1,000 mg (06/06/2018)  for chemotherapy treatment.     10/24/2017 Imaging    Interval resolution of large cervical mass and bilateral iliac lymphadenopathy since previous study.  Significant decrease in size of diffuse liver metastases since prior study. No new or progressive metastatic disease identified.  Stable probably benign cystic lesion in right adnexa. Previously seen left adnexal cystic lesion has resolved since prior study.  Stable large uterine fibroids. Increased mild right hydronephrosis due to extrinsic compression of ureter by enlarged uterus.    02/20/2018 Imaging    Interval improvement in diffuse liver metastases since prior study. No new or progressive metastatic disease.  Stable large uterine fibroids.  No significant change in tubular cystic lesion in right adnexa, suspicious for hydrosalpinx or endometrioma.    06/05/2018 Imaging    CT AP W Contrast 06/05/18 IMPRESSION: New mild lymphadenopathy in left paraaortic region and porta hepatis, and slight increase in size of right external iliac lymph node, consistent with metastatic disease.  Increased soft tissue prominence in region of cervix and  right vaginal cuff, consistent with local tumor progression.  Multiple liver metastases, with mild decrease in size of several metastatic lesions.  Stable uterine fibroids.    06/27/2018 -  Chemotherapy    The patient had Keytruda     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([place])   08/09/2017 Initial Diagnosis    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[facility])    09/12/2017 - 06/26/2018 Chemotherapy    The patient had bevacizumab (AVASTIN) 800 mg in sodium chloride 0.9 % 100 mL chemo infusion, 15 mg/kg = 800 mg, Intravenous,  Once, 10 of 10 cycles Dose modification: 15 mg/kg (original dose 15 mg/kg, Cycle 8, Reason: Provider Judgment), 15 mg/kg (original dose 15 mg/kg, Cycle 9, Reason: Other (see comments)) Administration: 800 mg (09/20/2017), 800 mg (11/01/2017), 800 mg (11/22/2017), 800 mg (12/13/2017), 800 mg (02/21/2018), 800 mg (04/04/2018), 1,000 mg (04/25/2018), 1,000 mg (05/16/2018), 1,000 mg (06/06/2018)  for chemotherapy treatment.     06/27/2018 -  Chemotherapy    The patient had pembrolizumab (KEYTRUDA) 200 mg in sodium chloride 0.9 % 50 mL chemo infusion, 200 mg, Intravenous, Once, 4 of 5 cycles Administration: 200 mg (06/27/2018), 200 mg (07/18/2018), 200 mg (08/08/2018)  for chemotherapy treatment.      REVIEW OF SYSTEMS:   Constitutional: Denies fevers, chills or abnormal weight loss Eyes: Denies blurriness of vision Ears, nose, mouth, throat, and face: Denies mucositis or sore throat Respiratory: Denies cough, dyspnea or wheezes Cardiovascular: Denies palpitation, chest discomfort or lower extremity swelling Gastrointestinal:  Denies nausea, heartburn or change in bowel habits Skin: Denies abnormal skin rashes Lymphatics: Denies new lymphadenopathy or easy bruising Neurological:Denies numbness, tingling or new weaknesses Behavioral/Psych: Mood is stable, no new changes  All other systems were reviewed with the patient and are negative.  I have reviewed the past medical history, past surgical history, social  history and family history with the patient and they are unchanged from previous note.  ALLERGIES:  is allergic to benadryl [diphenhydramine].  MEDICATIONS:  Current Outpatient Medications  Medication Sig Dispense Refill  . acetaminophen (TYLENOL) 325 MG tablet Take 650 mg by mouth every 6 (six) hours as needed.    . ALPRAZolam (XANAX) 0.5 MG tablet Take 1 tablet (0.5 mg total) by mouth at bedtime as needed for anxiety. 60 tablet 0  . amLODipine (NORVASC) 5 MG tablet Take 1 tablet (5 mg total) by mouth daily. TAKE 1 TABLET(10 MG) BY MOUTH DAILY    . magnesium oxide (MAG-OX) 400 (241.3 Mg) MG tablet Take 1 tablet (400 mg total) by mouth 2 (two) times daily. 60 tablet 6  . prochlorperazine (COMPAZINE) 10 MG tablet Take 1 tablet (10 mg total) by mouth every 6 (six) hours as needed for nausea or vomiting. 30 tablet 0  . sertraline (ZOLOFT) 25 MG tablet Take 1 tablet (25 mg total) by mouth daily. 30 tablet 9   No current facility-administered medications for this visit.    Facility-Administered Medications Ordered in Other Visits  Medication Dose Route Frequency Provider Last Rate Last Dose  . 0.9 %  sodium chloride infusion   Intravenous Once Brynleigh Sequeira, MD      . 0.9 %  sodium chloride infusion   Intravenous Once Alvy Bimler, Tavia Stave, MD      . heparin lock flush 100 unit/mL  500 Units Intracatheter Once PRN Alvy Bimler, Mykel Sponaugle, MD      . pembrolizumab (KEYTRUDA) 200 mg in sodium chloride 0.9 % 50 mL chemo infusion  200 mg Intravenous Once Doneisha Ivey, MD      . sodium chloride flush (NS) 0.9 % injection 10 mL  10 mL Intracatheter PRN Alvy Bimler, Cathye Kreiter, MD        PHYSICAL EXAMINATION: ECOG PERFORMANCE STATUS: 1 - Symptomatic but completely ambulatory  Vitals:   08/29/18 0912  BP: 119/71  Pulse: (!) 109  Resp: 18  Temp: 98 F (36.7 C)  SpO2: 100%   Filed Weights   08/29/18 0912  Weight: 138 lb 12.8 oz (63 kg)    GENERAL:alert, no distress and comfortable SKIN: skin color, texture, turgor are normal,  no rashes or significant lesions EYES: normal, Conjunctiva are pink and non-injected, sclera clear OROPHARYNX:no exudate, no erythema and lips, buccal mucosa, and tongue normal  NECK: supple, thyroid normal size, non-tender, without nodularity LYMPH:  no palpable lymphadenopathy in the cervical, axillary or inguinal LUNGS: clear to auscultation and percussion with normal breathing effort HEART: regular rate & rhythm and no murmurs and no lower extremity edema ABDOMEN:abdomen soft, non-tender and normal bowel sounds Musculoskeletal:no cyanosis of digits and no clubbing  NEURO: alert & oriented x 3 with fluent speech, no focal motor/sensory deficits  LABORATORY DATA:  I have reviewed the data as listed    Component Value Date/Time   NA 141 08/29/2018 0833   K 3.3 (L) 08/29/2018 0833   CL 104 08/29/2018 0833   CO2 26 08/29/2018 0833   GLUCOSE 138 (H) 08/29/2018 0833   BUN 10 08/29/2018 0833   CREATININE 0.75 08/29/2018 0833   CREATININE 0.85  02/20/2018 0850   CALCIUM 9.2 08/29/2018 0833   PROT 7.3 08/29/2018 0833   ALBUMIN 4.0 08/29/2018 0833   AST 48 (H) 08/29/2018 0833   AST 22 02/20/2018 0850   ALT 20 08/29/2018 0833   ALT 19 02/20/2018 0850   ALKPHOS 135 (H) 08/29/2018 0833   BILITOT 0.7 08/29/2018 0833   BILITOT 0.6 02/20/2018 0850   GFRNONAA >60 08/29/2018 0833   GFRNONAA >60 02/20/2018 0850   GFRAA >60 08/29/2018 0833   GFRAA >60 02/20/2018 0850    No results found for: SPEP, UPEP  Lab Results  Component Value Date   WBC 7.7 08/29/2018   NEUTROABS 6.1 08/29/2018   HGB 12.4 08/29/2018   HCT 38.1 08/29/2018   MCV 89.2 08/29/2018   PLT 162 08/29/2018      Chemistry      Component Value Date/Time   NA 141 08/29/2018 0833   K 3.3 (L) 08/29/2018 0833   CL 104 08/29/2018 0833   CO2 26 08/29/2018 0833   BUN 10 08/29/2018 0833   CREATININE 0.75 08/29/2018 0833   CREATININE 0.85 02/20/2018 0850      Component Value Date/Time   CALCIUM 9.2 08/29/2018 0833    ALKPHOS 135 (H) 08/29/2018 0833   AST 48 (H) 08/29/2018 0833   AST 22 02/20/2018 0850   ALT 20 08/29/2018 0833   ALT 19 02/20/2018 0850   BILITOT 0.7 08/29/2018 0833   BILITOT 0.6 02/20/2018 0850       All questions were answered. The patient knows to call the clinic with any problems, questions or concerns. No barriers to learning was detected.  I spent 15 minutes counseling the patient face to face. The total time spent in the appointment was 20 minutes and more than 50% was on counseling and review of test results  Heath Lark, MD 08/29/2018 9:36 AM

## 2018-09-12 ENCOUNTER — Telehealth: Payer: Self-pay

## 2018-09-12 NOTE — Telephone Encounter (Signed)
She called and left a message to call her.  Attempted to call both numbers with no answer or voicemail.

## 2018-09-15 ENCOUNTER — Telehealth: Payer: Self-pay | Admitting: Hematology and Oncology

## 2018-09-15 NOTE — Telephone Encounter (Signed)
Patient called to reschedule  °

## 2018-09-19 ENCOUNTER — Ambulatory Visit (HOSPITAL_COMMUNITY)
Admission: RE | Admit: 2018-09-19 | Discharge: 2018-09-19 | Disposition: A | Discharge status: Home / Self Care | Payer: BC Managed Care – PPO | Source: Ambulatory Visit | Attending: Hematology and Oncology | Admitting: Hematology and Oncology

## 2018-09-19 ENCOUNTER — Inpatient Hospital Stay: Payer: BC Managed Care – PPO

## 2018-09-19 DIAGNOSIS — R5383 Other fatigue: Secondary | ICD-10-CM

## 2018-09-19 DIAGNOSIS — C787 Secondary malignant neoplasm of liver and intrahepatic bile duct: Secondary | ICD-10-CM | POA: Diagnosis present

## 2018-09-19 DIAGNOSIS — C530 Malignant neoplasm of endocervix: Secondary | ICD-10-CM | POA: Diagnosis present

## 2018-09-19 DIAGNOSIS — D500 Iron deficiency anemia secondary to blood loss (chronic): Secondary | ICD-10-CM

## 2018-09-19 LAB — CBC WITH DIFFERENTIAL/PLATELET
Abs Immature Granulocytes: 0.01 10*3/uL (ref 0.00–0.07)
Basophils Absolute: 0 10*3/uL (ref 0.0–0.1)
Basophils Relative: 0 %
EOS ABS: 0 10*3/uL (ref 0.0–0.5)
EOS PCT: 0 %
HCT: 38.6 % (ref 36.0–46.0)
Hemoglobin: 12.3 g/dL (ref 12.0–15.0)
Immature Granulocytes: 0 %
Lymphocytes Relative: 18 %
Lymphs Abs: 1.1 10*3/uL (ref 0.7–4.0)
MCH: 28.5 pg (ref 26.0–34.0)
MCHC: 31.9 g/dL (ref 30.0–36.0)
MCV: 89.4 fL (ref 80.0–100.0)
Monocytes Absolute: 0.6 10*3/uL (ref 0.1–1.0)
Monocytes Relative: 9 %
Neutro Abs: 4.7 10*3/uL (ref 1.7–7.7)
Neutrophils Relative %: 73 %
PLATELETS: 168 10*3/uL (ref 150–400)
RBC: 4.32 MIL/uL (ref 3.87–5.11)
RDW: 13.9 % (ref 11.5–15.5)
WBC: 6.5 10*3/uL (ref 4.0–10.5)
nRBC: 0 % (ref 0.0–0.2)

## 2018-09-19 LAB — COMPREHENSIVE METABOLIC PANEL
ALT: 41 U/L (ref 0–44)
AST: 69 U/L — ABNORMAL HIGH (ref 15–41)
Albumin: 4.2 g/dL (ref 3.5–5.0)
Alkaline Phosphatase: 348 U/L — ABNORMAL HIGH (ref 38–126)
Anion gap: 12 (ref 5–15)
BUN: 8 mg/dL (ref 6–20)
CHLORIDE: 103 mmol/L (ref 98–111)
CO2: 25 mmol/L (ref 22–32)
Calcium: 9.2 mg/dL (ref 8.9–10.3)
Creatinine, Ser: 0.73 mg/dL (ref 0.44–1.00)
GFR calc Af Amer: >60 mL/min (ref >60)
GFR calc non Af Amer: >60 mL/min (ref >60)
Glucose, Bld: 91 mg/dL (ref 70–99)
Potassium: 3.8 mmol/L (ref 3.5–5.1)
Sodium: 140 mmol/L (ref 135–145)
Total Bilirubin: 0.4 mg/dL (ref 0.3–1.2)
Total Protein: 7.9 g/dL (ref 6.5–8.1)

## 2018-09-19 MED ORDER — IOHEXOL 300 MG/ML  SOLN
100.0000 mL | Freq: Once | INTRAMUSCULAR | Status: AC | PRN
  Administered 2018-09-19: 100 mL via INTRAVENOUS

## 2018-09-19 MED ORDER — HEPARIN SOD (PORK) LOCK FLUSH 100 UNIT/ML IV SOLN
INTRAVENOUS | Status: AC
  Filled 2018-09-19: qty 5

## 2018-09-19 MED ORDER — SODIUM CHLORIDE 0.9% FLUSH
10.0000 mL | Freq: Once | INTRAVENOUS | Status: AC
  Administered 2018-09-19: 10 mL
  Filled 2018-09-19: qty 10

## 2018-09-19 MED ORDER — SODIUM CHLORIDE (PF) 0.9 % IJ SOLN
INTRAMUSCULAR | Status: AC
  Filled 2018-09-19: qty 50

## 2018-09-19 MED ORDER — HEPARIN SOD (PORK) LOCK FLUSH 100 UNIT/ML IV SOLN
500.0000 [IU] | Freq: Once | INTRAVENOUS | Status: AC
  Administered 2018-09-19: 500 [IU] via INTRAVENOUS

## 2018-09-22 ENCOUNTER — Telehealth: Payer: Self-pay | Admitting: Hematology and Oncology

## 2018-09-22 ENCOUNTER — Inpatient Hospital Stay: Payer: BC Managed Care – PPO

## 2018-09-22 ENCOUNTER — Inpatient Hospital Stay: Payer: BC Managed Care – PPO | Attending: Hematology and Oncology | Admitting: Hematology and Oncology

## 2018-09-22 ENCOUNTER — Encounter: Payer: Self-pay | Admitting: Hematology and Oncology

## 2018-09-22 VITALS — BP 129/71 | HR 109 | Temp 98.2°F | Resp 18 | Ht 64.0 in | Wt 136.2 lb

## 2018-09-22 DIAGNOSIS — G893 Neoplasm related pain (acute) (chronic): Secondary | ICD-10-CM | POA: Insufficient documentation

## 2018-09-22 DIAGNOSIS — C787 Secondary malignant neoplasm of liver and intrahepatic bile duct: Secondary | ICD-10-CM | POA: Diagnosis not present

## 2018-09-22 DIAGNOSIS — C530 Malignant neoplasm of endocervix: Secondary | ICD-10-CM | POA: Diagnosis present

## 2018-09-22 DIAGNOSIS — Z7189 Other specified counseling: Secondary | ICD-10-CM

## 2018-09-22 DIAGNOSIS — D259 Leiomyoma of uterus, unspecified: Secondary | ICD-10-CM

## 2018-09-22 DIAGNOSIS — Z79899 Other long term (current) drug therapy: Secondary | ICD-10-CM | POA: Insufficient documentation

## 2018-09-22 DIAGNOSIS — Z5111 Encounter for antineoplastic chemotherapy: Secondary | ICD-10-CM | POA: Diagnosis present

## 2018-09-22 DIAGNOSIS — D500 Iron deficiency anemia secondary to blood loss (chronic): Secondary | ICD-10-CM

## 2018-09-22 DIAGNOSIS — Z9221 Personal history of antineoplastic chemotherapy: Secondary | ICD-10-CM | POA: Diagnosis not present

## 2018-09-22 DIAGNOSIS — R748 Abnormal levels of other serum enzymes: Secondary | ICD-10-CM | POA: Diagnosis not present

## 2018-09-22 LAB — TSH: TSH: 2.273 u[IU]/mL (ref 0.308–3.960)

## 2018-09-22 MED ORDER — LIDOCAINE-PRILOCAINE 2.5-2.5 % EX CREA: TOPICAL_CREAM | CUTANEOUS | 3 refills | Status: AC

## 2018-09-22 MED ORDER — DEXAMETHASONE 4 MG PO TABS: ORAL_TABLET | ORAL | 0 refills | Status: DC

## 2018-09-22 MED ORDER — ONDANSETRON HCL 8 MG PO TABS: 8.0000 mg | ORAL_TABLET | Freq: Three times a day (TID) | ORAL | 3 refills | Status: AC | PRN

## 2018-09-22 MED ORDER — MORPHINE SULFATE 15 MG PO TABS: 15.0000 mg | ORAL_TABLET | ORAL | 0 refills | Status: DC | PRN

## 2018-09-22 NOTE — Progress Notes (Signed)
DISCONTINUE OFF PATHWAY REGIMEN - [Other Dx]   [id number]:Pembrolizumab 200 mg q21 Days:   A cycle is 21 days:     Pembrolizumab   **Always confirm dose/schedule in your pharmacy ordering system**  REASON: Disease Progression PRIOR TREATMENT: Pembrolizumab 200 mg q21 Days TREATMENT RESPONSE: Progressive Disease (PD)  START OFF PATHWAY REGIMEN - Other Dx   [id number]:Carboplatin + Paclitaxel (5/175) q21 Days:   A cycle is every 21 days:     Paclitaxel      Carboplatin   **Always confirm dose/schedule in your pharmacy ordering system**  Patient Characteristics: Intent of Therapy: Non-Curative / Palliative Intent, Discussed with Patient

## 2018-09-22 NOTE — Telephone Encounter (Signed)
Gave avs and calendar ° °

## 2018-09-23 ENCOUNTER — Encounter: Payer: Self-pay | Admitting: Hematology and Oncology

## 2018-09-23 DIAGNOSIS — G893 Neoplasm related pain (acute) (chronic): Secondary | ICD-10-CM | POA: Insufficient documentation

## 2018-09-23 NOTE — Progress Notes (Signed)
FMLA successfully faxed to Jacque Perryman at [phone]. Mailed copy to patient address on file. 

## 2018-09-23 NOTE — Assessment & Plan Note (Signed)
The patient is aware, with relapsed disease, this is not curative She would like to proceed with palliative treatment She is not able to work I have spent some time completing application for her to continue on disability

## 2018-09-23 NOTE — Assessment & Plan Note (Signed)
She has liver metastasis and elevated liver enzymes We will monitor closely while she is on treatment

## 2018-09-23 NOTE — Assessment & Plan Note (Signed)
I have reviewed multiple imaging studies with the patient Unfortunately, she has signs of rapid disease progression and she is symptomatic I recommend switching her treatment to carboplatin and Taxol The goal of treatment is strictly palliative in nature  We reviewed the NCCN guidelines  We discussed some of the risks, benefits, side-effects of carboplatin & Taxol. Treatment is intravenous, every 3 weeks x 6 cycles  Some of the short term side-effects included, though not limited to, including weight loss, life threatening infections, risk of allergic reactions, need for transfusions of blood products, nausea, vomiting, change in bowel habits, loss of hair, admission to hospital for various reasons, and risks of death.   Long term side-effects are also discussed including risks of infertility, permanent damage to nerve function, hearing loss, chronic fatigue, kidney damage with possibility needing hemodialysis, and rare secondary malignancy including bone marrow disorders.  The patient is aware that the response rates discussed earlier is not guaranteed.  After a long discussion, patient made an informed decision to proceed with the prescribed plan of care.   Patient education material was dispensed. We discussed premedication with dexamethasone before chemotherapy.

## 2018-09-23 NOTE — Assessment & Plan Note (Signed)
She has new cancer associated pain likely due to disease I recommend immediate release morphine to take as needed We discussed potential side effects such as sedation, nausea and constipation I will see her back next week to review pain control

## 2018-09-23 NOTE — Progress Notes (Signed)
[place] OFFICE PROGRESS NOTE  Patient Care Team: Avel Sensor, MD as PCP - General (Obstetrics and Gynecology)  ASSESSMENT & PLAN:  Cervical cancer [facility]) I have reviewed multiple imaging studies with the patient Unfortunately, she has signs of rapid disease progression and she is symptomatic I recommend switching her treatment to carboplatin and Taxol The goal of treatment is strictly palliative in nature  We reviewed the NCCN guidelines  We discussed some of the risks, benefits, side-effects of carboplatin & Taxol. Treatment is intravenous, every 3 weeks x 6 cycles  Some of the short term side-effects included, though not limited to, including weight loss, life threatening infections, risk of allergic reactions, need for transfusions of blood products, nausea, vomiting, change in bowel habits, loss of hair, admission to hospital for various reasons, and risks of death.   Long term side-effects are also discussed including risks of infertility, permanent damage to nerve function, hearing loss, chronic fatigue, kidney damage with possibility needing hemodialysis, and rare secondary malignancy including bone marrow disorders.  The patient is aware that the response rates discussed earlier is not guaranteed.  After a long discussion, patient made an informed decision to proceed with the prescribed plan of care.   Patient education material was dispensed. We discussed premedication with dexamethasone before chemotherapy.   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[facility]) She has liver metastasis and elevated liver enzymes We will monitor closely while she is on treatment  Cancer associated pain She has new cancer associated pain likely due to disease I recommend immediate release morphine to take as needed We discussed potential side effects such as sedation, nausea and constipation I will see her back next week to review pain control  Goals of care, counseling/discussion The  patient is aware, with relapsed disease, this is not curative She would like to proceed with palliative treatment She is not able to work I have spent some time completing application for her to continue on disability   Orders Placed This Encounter  Procedures  . CBC with Differential (Cancer Center Only)    Standing Status:   Standing    Number of Occurrences:   20    Standing Expiration Date:   09/23/2019  . CMP ([place] only)    Standing Status:   Standing    Number of Occurrences:   20    Standing Expiration Date:   09/23/2019    INTERVAL HISTORY: Please see below for problem oriented charting. She returns to review test results She has complain of some leg pain and abdominal discomfort since last time I saw her Denies cough, chest pain or shortness of breath Her appetite is stable but she has lost some weight  SUMMARY OF ONCOLOGIC HISTORY: Oncology History   She tested positive for PD-L1 25% from liver biopsy  Progressed bevacizumab and keytruda     Cervical cancer ([place])   07/22/2017 Initial Diagnosis    The patient was examined in the wellness center.  Upon vaginal exam, friable mass of tissue presumably from the cervix was interpreted as poorly differentiated high-grade malignant neoplasm.  Follow-up workup with ultrasound of the uterus revealed multiple large uterine fibroids.    07/22/2017 Pathology Results    Biopsy confirmed poorly differentiated high-grade malignant neoplasm.    08/06/2017 Imaging    6.6 cm uterine mass in area of cervix and upper vagina, highly suspicious for cervical carcinoma.  Mild bilateral iliac lymphadenopathy, consistent with metastatic disease. No abdominal lymphadenopathy identified.  Multiple large liver  masses, with areas of central necrosis or scarring, highly suspicious for liver metastases. Although unlikely, it is conceivable that some of these lesions could represent other etiology such as focal nodular hyperplasia or  adenomas. Consider further evaluation with PET-CT, abdomen MRI without and with contrast, or percutaneous needle biopsy.  Multiple large uterine fibroids, many showing cystic degeneration.  Bilateral cystic adnexal lesions with probably benign features. Differential diagnosis includes ovarian cysts, endometriomas, and hydrosalpinges. These could also be further assessed on PET-CT or MRI.  No evidence of thoracic metastatic disease.     08/09/2017 Tumor Marker    Patient's tumor was tested for the following markers: CA-125 Results of the tumor marker test revealed 79.2    08/14/2017 Pathology Results    Liver, needle/core biopsy, left hepatic lobe - METASTATIC POORLY DIFFERENTIATED CARCINOMA, SEE COMMENT. Microscopic Comment The tumor is poorly differentiated with abundant necrosis. Pancytokeratin, PAX-8, cytokeratin 7, and p16 are positive. There is weak non-specific staining with TTF-1, synaptophysin (very focal), p63 (very focal). Chromogranin, CD56, S100, desmin, SMA, ER, CD10, cytokeratin 10, cytokeratin 5/6, and cytokeratin 20 are negative. Thus, the immunoprofile is suggest of a gynecologic primary. Dr. Lyndon Code has reviewed the case.     08/21/2017 Procedure    Ultrasound and fluoroscopically guided right internal jugular single lumen power port catheter insertion. Tip in the SVC/RA junction. Catheter ready for use.    08/30/2017 - 01/03/2018 Chemotherapy    The patient had received cisplatin and Taxol x 6 cycles    09/12/2017 - 06/26/2018 Chemotherapy    The patient had bevacizumab for chemotherapy treatment.     10/24/2017 Imaging    Interval resolution of large cervical mass and bilateral iliac lymphadenopathy since previous study.  Significant decrease in size of diffuse liver metastases since prior study. No new or progressive metastatic disease identified.  Stable probably benign cystic lesion in right adnexa. Previously seen left adnexal cystic lesion has resolved since prior  study.  Stable large uterine fibroids. Increased mild right hydronephrosis due to extrinsic compression of ureter by enlarged uterus.    02/20/2018 Imaging    Interval improvement in diffuse liver metastases since prior study. No new or progressive metastatic disease.  Stable large uterine fibroids.  No significant change in tubular cystic lesion in right adnexa, suspicious for hydrosalpinx or endometrioma.    06/05/2018 Imaging    CT AP W Contrast 06/05/18 IMPRESSION: New mild lymphadenopathy in left paraaortic region and porta hepatis, and slight increase in size of right external iliac lymph node, consistent with metastatic disease.  Increased soft tissue prominence in region of cervix and right vaginal cuff, consistent with local tumor progression.  Multiple liver metastases, with mild decrease in size of several metastatic lesions.  Stable uterine fibroids.    06/27/2018 - 08/29/2018 Chemotherapy    The patient had Keytruda    09/19/2018 Imaging    1. Increased size of large masses in the cervix and lower uterine segment, consistent with local recurrence of cervical carcinoma. This results in new moderate hydrometros. 2. Increased abdominal and pelvic lymphadenopathy, consistent with metastatic disease. 3. Markedly increased size of a central right hepatic lobe metastasis. Other smaller liver metastases show no significant change. 4. Stable large uterine fibroids.      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([place])   08/09/2017 Initial Diagnosis    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([place])    09/12/2017 - 06/26/2018 Chemotherapy    The patient had bevacizumab (AVASTIN) 800 mg in sodium chloride 0.9 % 100 mL chemo  infusion, 15 mg/kg = 800 mg, Intravenous,  Once, 10 of 10 cycles Dose modification: 15 mg/kg (original dose 15 mg/kg, Cycle 8, Reason: Provider Judgment), 15 mg/kg (original dose 15 mg/kg, Cycle 9, Reason: Other (see comments)) Administration: 800 mg (09/20/2017), 800 mg (11/01/2017), 800 mg  (11/22/2017), 800 mg (12/13/2017), 800 mg (02/21/2018), 800 mg (04/04/2018), 1,000 mg (04/25/2018), 1,000 mg (05/16/2018), 1,000 mg (06/06/2018)  for chemotherapy treatment.     06/27/2018 - 09/21/2018 Chemotherapy    The patient had pembrolizumab (KEYTRUDA) 200 mg in sodium chloride 0.9 % 50 mL chemo infusion, 200 mg, Intravenous, Once, 4 of 5 cycles Administration: 200 mg (06/27/2018), 200 mg (07/18/2018), 200 mg (08/08/2018), 200 mg (08/29/2018)  for chemotherapy treatment.     10/03/2018 -  Chemotherapy    The patient had palonosetron (ALOXI) injection 0.25 mg, 0.25 mg, Intravenous,  Once, 0 of 4 cycles CARBOplatin (PARAPLATIN) 550 mg in sodium chloride 0.9 % 250 mL chemo infusion, 550 mg (100 % of original dose 549 mg), Intravenous,  Once, 0 of 4 cycles Dose modification:   (original dose 549 mg, Cycle 1) PACLitaxel (TAXOL) 294 mg in sodium chloride 0.9 % 250 mL chemo infusion (> 9m/m2), 175 mg/m2 = 294 mg, Intravenous,  Once, 0 of 4 cycles  for chemotherapy treatment.      REVIEW OF SYSTEMS:   Constitutional: Denies fevers, chills  Eyes: Denies blurriness of vision Ears, nose, mouth, throat, and face: Denies mucositis or sore throat Respiratory: Denies cough, dyspnea or wheezes Cardiovascular: Denies palpitation, chest discomfort or lower extremity swelling Gastrointestinal:  Denies nausea, heartburn or change in bowel habits Skin: Denies abnormal skin rashes Lymphatics: Denies new lymphadenopathy or easy bruising Neurological:Denies numbness, tingling or new weaknesses Behavioral/Psych: Mood is stable, no new changes  All other systems were reviewed with the patient and are negative.  I have reviewed the past medical history, past surgical history, social history and family history with the patient and they are unchanged from previous note.  ALLERGIES:  is allergic to benadryl [diphenhydramine].  MEDICATIONS:  Current Outpatient Medications  Medication Sig Dispense Refill  .  acetaminophen (TYLENOL) 325 MG tablet Take 650 mg by mouth every 6 (six) hours as needed.    . ALPRAZolam (XANAX) 0.5 MG tablet Take 1 tablet (0.5 mg total) by mouth at bedtime as needed for anxiety. 60 tablet 0  . amLODipine (NORVASC) 5 MG tablet Take 1 tablet (5 mg total) by mouth daily. TAKE 1 TABLET(10 MG) BY MOUTH DAILY    . dexamethasone (DECADRON) 4 MG tablet Take 3 tabs at the night before and 3 tab the morning of chemotherapy, every 3 weeks, by mouth 36 tablet 0  . lidocaine-prilocaine (EMLA) cream Apply to affected area once 30 g 3  . magnesium oxide (MAG-OX) 400 (241.3 Mg) MG tablet Take 1 tablet (400 mg total) by mouth 2 (two) times daily. 60 tablet 6  . morphine (MSIR) 15 MG tablet Take 1 tablet (15 mg total) by mouth every 4 (four) hours as needed for severe pain. 30 tablet 0  . ondansetron (ZOFRAN) 8 MG tablet Take 1 tablet (8 mg total) by mouth every 8 (eight) hours as needed for nausea. 30 tablet 3  . prochlorperazine (COMPAZINE) 10 MG tablet Take 1 tablet (10 mg total) by mouth every 6 (six) hours as needed for nausea or vomiting. 30 tablet 0  . sertraline (ZOLOFT) 25 MG tablet Take 1 tablet (25 mg total) by mouth daily. 30 tablet 9   No current  facility-administered medications for this visit.     PHYSICAL EXAMINATION: ECOG PERFORMANCE STATUS: 1 - Symptomatic but completely ambulatory  Vitals:   09/22/18 1237  BP: 129/71  Pulse: (!) 109  Resp: 18  Temp: 98.2 F (36.8 C)  SpO2: 100%   Filed Weights   09/22/18 1237  Weight: 136 lb 3.2 oz (61.8 kg)    GENERAL:alert, no distress and comfortable SKIN: skin color, texture, turgor are normal, no rashes or significant lesions EYES: normal, Conjunctiva are pink and non-injected, sclera clear OROPHARYNX:no exudate, no erythema and lips, buccal mucosa, and tongue normal  NECK: supple, thyroid normal size, non-tender, without nodularity LYMPH:  no palpable lymphadenopathy in the cervical, axillary or inguinal LUNGS: clear  to auscultation and percussion with normal breathing effort HEART: regular rate & rhythm and no murmurs and no lower extremity edema ABDOMEN:abdomen soft, non-tender and normal bowel sounds Musculoskeletal:no cyanosis of digits and no clubbing  NEURO: alert & oriented x 3 with fluent speech, no focal motor/sensory deficits  LABORATORY DATA:  I have reviewed the data as listed    Component Value Date/Time   NA 140 09/19/2018 1447   K 3.8 09/19/2018 1447   CL 103 09/19/2018 1447   CO2 25 09/19/2018 1447   GLUCOSE 91 09/19/2018 1447   BUN 8 09/19/2018 1447   CREATININE 0.73 09/19/2018 1447   CREATININE 0.85 02/20/2018 0850   CALCIUM 9.2 09/19/2018 1447   PROT 7.9 09/19/2018 1447   ALBUMIN 4.2 09/19/2018 1447   AST 69 (H) 09/19/2018 1447   AST 22 02/20/2018 0850   ALT 41 09/19/2018 1447   ALT 19 02/20/2018 0850   ALKPHOS 348 (H) 09/19/2018 1447   BILITOT 0.4 09/19/2018 1447   BILITOT 0.6 02/20/2018 0850   GFRNONAA >60 09/19/2018 1447   GFRNONAA >60 02/20/2018 0850   GFRAA >60 09/19/2018 1447   GFRAA >60 02/20/2018 0850    No results found for: SPEP, UPEP  Lab Results  Component Value Date   WBC 6.5 09/19/2018   NEUTROABS 4.7 09/19/2018   HGB 12.3 09/19/2018   HCT 38.6 09/19/2018   MCV 89.4 09/19/2018   PLT 168 09/19/2018      Chemistry      Component Value Date/Time   NA 140 09/19/2018 1447   K 3.8 09/19/2018 1447   CL 103 09/19/2018 1447   CO2 25 09/19/2018 1447   BUN 8 09/19/2018 1447   CREATININE 0.73 09/19/2018 1447   CREATININE 0.85 02/20/2018 0850      Component Value Date/Time   CALCIUM 9.2 09/19/2018 1447   ALKPHOS 348 (H) 09/19/2018 1447   AST 69 (H) 09/19/2018 1447   AST 22 02/20/2018 0850   ALT 41 09/19/2018 1447   ALT 19 02/20/2018 0850   BILITOT 0.4 09/19/2018 1447   BILITOT 0.6 02/20/2018 0850       RADIOGRAPHIC STUDIES: I have reviewed multiple imaging studies with the patient I have personally reviewed the radiological images as  listed and agreed with the findings in the report. Ct Abdomen Pelvis W Contrast  Result Date: 09/20/2018 CLINICAL DATA:  Cervical carcinoma with liver metastases. Ongoing immunotherapy. Restaging. EXAM: CT ABDOMEN AND PELVIS WITH CONTRAST TECHNIQUE: Multidetector CT imaging of the abdomen and pelvis was performed using the standard protocol following bolus administration of intravenous contrast. CONTRAST:  175m OMNIPAQUE IOHEXOL 300 MG/ML  SOLN COMPARISON:  06/05/2018 FINDINGS: Lower Chest: No acute findings. Hepatobiliary: Liver metastases are again seen, with marked increase in size of a metastasis in  the central right hepatic lobe. This measures 8.1 x 6.7 cm compared to 1.8 x 1.5 cm previously. Other smaller liver metastases in both right and left lobes show no significant change. Gallbladder is unremarkable. No evidence of biliary ductal dilatation. Pancreas:  No mass or inflammatory changes. Spleen: Within normal limits in size and appearance. Adrenals/Urinary Tract: No masses identified. No evidence of hydronephrosis. Stomach/Bowel: No evidence of obstruction, inflammatory process or abnormal fluid collections. Vascular/Lymphatic: Increased necrotic adenopathy is seen in the porta hepatis, with largest nodal conglomerate measuring 2.9 cm in short axis, compared to 1.2 cm previously. New mild retroperitoneal lymphadenopathy is also seen in the paraaortic and aortocaval spaces, with largest lymph node measuring 1.6 cm in short axis on image 32/2. Mild bilateral iliac lymphadenopathy is new since previous study, largest measuring 1.6 cm in left external iliac chain, and 10 mm in the proximal left external iliac chain. Reproductive: Multiple uterine fibroids are again seen, largest measuring 9.3 cm and showing peripheral calcification. Increased fluid is seen within the endometrial cavity, consistent with hydrometros. Ill-defined low-attenuation mass in the region of the cervix currently measures 5.7 x 4.8 cm  on image 78/2 compared to 5.1 x 4.8 cm previously. Another ill-defined low-attenuation mass is seen in the lower uterine segment which measures 7.4 x 6.2 cm on image 72/2, which is significantly increased in size compared to prior study. This is consistent with local recurrence of cervical carcinoma. An ovoid fluid collection is again seen in the right posterior adnexa which measures 5.2 x 2.3 cm and is unchanged since previous study. No evidence of free intraperitoneal fluid. Other:  None. Musculoskeletal:  No suspicious bone lesions identified. IMPRESSION: 1. Increased size of large masses in the cervix and lower uterine segment, consistent with local recurrence of cervical carcinoma. This results in new moderate hydrometros. 2. Increased abdominal and pelvic lymphadenopathy, consistent with metastatic disease. 3. Markedly increased size of a central right hepatic lobe metastasis. Other smaller liver metastases show no significant change. 4. Stable large uterine fibroids. Electronically Signed   By: Earle Gell M.D.   On: 09/20/2018 15:21    All questions were answered. The patient knows to call the clinic with any problems, questions or concerns. No barriers to learning was detected.  I spent 30 minutes counseling the patient face to face. The total time spent in the appointment was 40 minutes and more than 50% was on counseling and review of test results  Heath Lark, MD 09/23/2018 3:41 PM

## 2018-10-02 ENCOUNTER — Other Ambulatory Visit: Payer: Self-pay

## 2018-10-02 ENCOUNTER — Inpatient Hospital Stay (HOSPITAL_BASED_OUTPATIENT_CLINIC_OR_DEPARTMENT_OTHER): Payer: BC Managed Care – PPO | Admitting: Hematology and Oncology

## 2018-10-02 ENCOUNTER — Inpatient Hospital Stay: Payer: BC Managed Care – PPO

## 2018-10-02 ENCOUNTER — Encounter: Payer: Self-pay | Admitting: Hematology and Oncology

## 2018-10-02 DIAGNOSIS — G893 Neoplasm related pain (acute) (chronic): Secondary | ICD-10-CM | POA: Diagnosis not present

## 2018-10-02 DIAGNOSIS — D259 Leiomyoma of uterus, unspecified: Secondary | ICD-10-CM

## 2018-10-02 DIAGNOSIS — C787 Secondary malignant neoplasm of liver and intrahepatic bile duct: Secondary | ICD-10-CM

## 2018-10-02 DIAGNOSIS — R748 Abnormal levels of other serum enzymes: Secondary | ICD-10-CM | POA: Diagnosis not present

## 2018-10-02 DIAGNOSIS — C530 Malignant neoplasm of endocervix: Secondary | ICD-10-CM | POA: Diagnosis not present

## 2018-10-02 DIAGNOSIS — Z79899 Other long term (current) drug therapy: Secondary | ICD-10-CM

## 2018-10-02 DIAGNOSIS — R5383 Other fatigue: Secondary | ICD-10-CM

## 2018-10-02 DIAGNOSIS — Z9221 Personal history of antineoplastic chemotherapy: Secondary | ICD-10-CM

## 2018-10-02 LAB — COMPREHENSIVE METABOLIC PANEL
ALT: 28 U/L (ref 0–44)
AST: 58 U/L — ABNORMAL HIGH (ref 15–41)
Albumin: 4 g/dL (ref 3.5–5.0)
Alkaline Phosphatase: 376 U/L — ABNORMAL HIGH (ref 38–126)
Anion gap: 12 (ref 5–15)
BILIRUBIN TOTAL: 0.5 mg/dL (ref 0.3–1.2)
BUN: 11 mg/dL (ref 6–20)
CO2: 25 mmol/L (ref 22–32)
Calcium: 9.2 mg/dL (ref 8.9–10.3)
Chloride: 103 mmol/L (ref 98–111)
Creatinine, Ser: 0.82 mg/dL (ref 0.44–1.00)
GFR calc Af Amer: >60 mL/min (ref >60)
GFR calc non Af Amer: >60 mL/min (ref >60)
Glucose, Bld: 90 mg/dL (ref 70–99)
Potassium: 4 mmol/L (ref 3.5–5.1)
Sodium: 140 mmol/L (ref 135–145)
Total Protein: 7.5 g/dL (ref 6.5–8.1)

## 2018-10-02 LAB — CBC WITH DIFFERENTIAL/PLATELET
ABS IMMATURE GRANULOCYTES: 0.02 10*3/uL (ref 0.00–0.07)
Basophils Absolute: 0 10*3/uL (ref 0.0–0.1)
Basophils Relative: 0 %
Eosinophils Absolute: 0 10*3/uL (ref 0.0–0.5)
Eosinophils Relative: 0 %
HCT: 36.3 % (ref 36.0–46.0)
Hemoglobin: 11.3 g/dL — ABNORMAL LOW (ref 12.0–15.0)
Immature Granulocytes: 0 %
LYMPHS PCT: 17 %
Lymphs Abs: 1.3 10*3/uL (ref 0.7–4.0)
MCH: 28.1 pg (ref 26.0–34.0)
MCHC: 31.1 g/dL (ref 30.0–36.0)
MCV: 90.3 fL (ref 80.0–100.0)
Monocytes Absolute: 0.5 10*3/uL (ref 0.1–1.0)
Monocytes Relative: 7 %
Neutro Abs: 5.7 10*3/uL (ref 1.7–7.7)
Neutrophils Relative %: 76 %
Platelets: 169 10*3/uL (ref 150–400)
RBC: 4.02 MIL/uL (ref 3.87–5.11)
RDW: 14.2 % (ref 11.5–15.5)
WBC: 7.5 10*3/uL (ref 4.0–10.5)
nRBC: 0 % (ref 0.0–0.2)

## 2018-10-02 LAB — TSH: TSH: 19.218 u[IU]/mL — ABNORMAL HIGH (ref 0.308–3.960)

## 2018-10-02 MED ORDER — LEVOTHYROXINE SODIUM 25 MCG PO TABS: 25.0000 ug | ORAL_TABLET | Freq: Every day | ORAL | 1 refills | Status: AC

## 2018-10-02 MED ORDER — SODIUM CHLORIDE 0.9% FLUSH
10.0000 mL | Freq: Once | INTRAVENOUS | Status: AC
  Administered 2018-10-02: 10 mL
  Filled 2018-10-02: qty 10

## 2018-10-02 MED ORDER — HEPARIN SOD (PORK) LOCK FLUSH 100 UNIT/ML IV SOLN
500.0000 [IU] | Freq: Once | INTRAVENOUS | Status: AC
  Administered 2018-10-02: 500 [IU]
  Filled 2018-10-02: qty 5

## 2018-10-02 NOTE — Progress Notes (Signed)
[facility] OFFICE PROGRESS NOTE  Patient Care Team: Avel Sensor, MD as PCP - General (Obstetrics and Gynecology)  ASSESSMENT & PLAN:  Cervical cancer [facility]) Unfortunately, she has signs of rapid disease progression and she is symptomatic I recommend switching her treatment to carboplatin and Taxol The goal of treatment is strictly palliative in nature  We reviewed the NCCN guidelines  We discussed some of the risks, benefits, side-effects of carboplatin & Taxol. Treatment is intravenous, every 3 weeks x 6 cycles  Some of the short term side-effects included, though not limited to, including weight loss, life threatening infections, risk of allergic reactions, need for transfusions of blood products, nausea, vomiting, change in bowel habits, loss of hair, admission to hospital for various reasons, and risks of death.   Long term side-effects are also discussed including risks of infertility, permanent damage to nerve function, hearing loss, chronic fatigue, kidney damage with possibility needing hemodialysis, and rare secondary malignancy including bone marrow disorders.  The patient is aware that the response rates discussed earlier is not guaranteed.  After a long discussion, patient made an informed decision to proceed with the prescribed plan of care.   Patient education material was dispensed. We discussed premedication with dexamethasone before chemotherapy. Given her young age, she does not need G-CSF support  Cancer associated pain She has new cancer associated pain likely due to disease I recommend immediate release morphine to take as needed We discussed potential side effects such as sedation, nausea and constipation Her pain is stable so far.  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   INTERVAL HISTORY: Please see below for problem oriented charting. She returns for chemotherapy consent and follow-up Since last time I saw her, she feels well Appetite is stable Her pain is well controlled with current prescription pain medicine to take as needed She denies recent bowel issues.  SUMMARY OF ONCOLOGIC HISTORY: Oncology History   She tested positive for PD-L1 25% from liver biopsy  Progressed bevacizumab and keytruda     Cervical cancer ([place])   07/22/2017 Initial Diagnosis    The patient was examined in the wellness center.  Upon vaginal exam, friable mass of tissue presumably from the cervix was interpreted as poorly differentiated high-grade malignant neoplasm.  Follow-up workup with ultrasound of the uterus revealed multiple large uterine fibroids.    07/22/2017 Pathology Results    Biopsy confirmed poorly differentiated high-grade malignant neoplasm.    08/06/2017 Imaging    6.6 cm uterine mass in area of cervix and upper vagina, highly suspicious for cervical carcinoma.  Mild bilateral iliac lymphadenopathy, consistent with metastatic disease. No abdominal lymphadenopathy identified.  Multiple large liver masses, with areas of central necrosis or scarring, highly suspicious for liver metastases. Although unlikely, it is conceivable that some of these lesions could represent other etiology such as focal nodular hyperplasia or adenomas. Consider further evaluation with PET-CT, abdomen MRI without and with contrast, or percutaneous needle biopsy.  Multiple large uterine fibroids, many showing cystic degeneration.  Bilateral cystic adnexal lesions with probably benign features. Differential diagnosis includes ovarian cysts, endometriomas, and hydrosalpinges. These could also be further assessed on PET-CT or MRI.  No evidence of thoracic metastatic disease.     08/09/2017 Tumor Marker    Patient's tumor was tested  for the  following markers: CA-125 Results of the tumor marker test revealed 79.2    08/14/2017 Pathology Results    Liver, needle/core biopsy, left hepatic lobe - METASTATIC POORLY DIFFERENTIATED CARCINOMA, SEE COMMENT. Microscopic Comment The tumor is poorly differentiated with abundant necrosis. Pancytokeratin, PAX-8, cytokeratin 7, and p16 are positive. There is weak non-specific staining with TTF-1, synaptophysin (very focal), p63 (very focal). Chromogranin, CD56, S100, desmin, SMA, ER, CD10, cytokeratin 10, cytokeratin 5/6, and cytokeratin 20 are negative. Thus, the immunoprofile is suggest of a gynecologic primary. Dr. Lyndon Code has reviewed the case.     08/21/2017 Procedure    Ultrasound and fluoroscopically guided right internal jugular single lumen power port catheter insertion. Tip in the SVC/RA junction. Catheter ready for use.    08/30/2017 - 01/03/2018 Chemotherapy    The patient had received cisplatin and Taxol x 6 cycles    09/12/2017 - 06/26/2018 Chemotherapy    The patient had bevacizumab for chemotherapy treatment.     10/24/2017 Imaging    Interval resolution of large cervical mass and bilateral iliac lymphadenopathy since previous study.  Significant decrease in size of diffuse liver metastases since prior study. No new or progressive metastatic disease identified.  Stable probably benign cystic lesion in right adnexa. Previously seen left adnexal cystic lesion has resolved since prior study.  Stable large uterine fibroids. Increased mild right hydronephrosis due to extrinsic compression of ureter by enlarged uterus.    02/20/2018 Imaging    Interval improvement in diffuse liver metastases since prior study. No new or progressive metastatic disease.  Stable large uterine fibroids.  No significant change in tubular cystic lesion in right adnexa, suspicious for hydrosalpinx or endometrioma.    06/05/2018 Imaging    CT AP W Contrast 06/05/18 IMPRESSION: New mild lymphadenopathy in  left paraaortic region and porta hepatis, and slight increase in size of right external iliac lymph node, consistent with metastatic disease.  Increased soft tissue prominence in region of cervix and right vaginal cuff, consistent with local tumor progression.  Multiple liver metastases, with mild decrease in size of several metastatic lesions.  Stable uterine fibroids.    06/27/2018 - 08/29/2018 Chemotherapy    The patient had Keytruda    09/19/2018 Imaging    1. Increased size of large masses in the cervix and lower uterine segment, consistent with local recurrence of cervical carcinoma. This results in new moderate hydrometros. 2. Increased abdominal and pelvic lymphadenopathy, consistent with metastatic disease. 3. Markedly increased size of a central right hepatic lobe metastasis. Other smaller liver metastases show no significant change. 4. Stable large uterine fibroids.      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([place])   08/09/2017 Initial Diagnosis    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[facility])    09/12/2017 - 06/26/2018 Chemotherapy    The patient had bevacizumab (AVASTIN) 800 mg in sodium chloride 0.9 % 100 mL chemo infusion, 15 mg/kg = 800 mg, Intravenous,  Once, 10 of 10 cycles Dose modification: 15 mg/kg (original dose 15 mg/kg, Cycle 8, Reason: Provider Judgment), 15 mg/kg (original dose 15 mg/kg, Cycle 9, Reason: Other (see comments)) Administration: 800 mg (09/20/2017), 800 mg (11/01/2017), 800 mg (11/22/2017), 800 mg (12/13/2017), 800 mg (02/21/2018), 800 mg (04/04/2018), 1,000 mg (04/25/2018), 1,000 mg (05/16/2018), 1,000 mg (06/06/2018)  for chemotherapy treatment.     06/27/2018 - 09/21/2018 Chemotherapy    The patient had pembrolizumab (KEYTRUDA) 200 mg in sodium chloride 0.9 % 50 mL chemo infusion, 200 mg, Intravenous, Once, 4 of 5 cycles Administration:  200 mg (06/27/2018), 200 mg (07/18/2018), 200 mg (08/08/2018), 200 mg (08/29/2018)  for chemotherapy treatment.     10/03/2018 -  Chemotherapy    The patient  had palonosetron (ALOXI) injection 0.25 mg, 0.25 mg, Intravenous,  Once, 0 of 4 cycles CARBOplatin (PARAPLATIN) 550 mg in sodium chloride 0.9 % 250 mL chemo infusion, 550 mg (100 % of original dose 549 mg), Intravenous,  Once, 0 of 4 cycles Dose modification:   (original dose 549 mg, Cycle 1) PACLitaxel (TAXOL) 294 mg in sodium chloride 0.9 % 250 mL chemo infusion (> 60m/m2), 175 mg/m2 = 294 mg, Intravenous,  Once, 0 of 4 cycles  for chemotherapy treatment.      REVIEW OF SYSTEMS:   Constitutional: Denies fevers, chills or abnormal weight loss Eyes: Denies blurriness of vision Ears, nose, mouth, throat, and face: Denies mucositis or sore throat Respiratory: Denies cough, dyspnea or wheezes Cardiovascular: Denies palpitation, chest discomfort or lower extremity swelling Gastrointestinal:  Denies nausea, heartburn or change in bowel habits Skin: Denies abnormal skin rashes Lymphatics: Denies new lymphadenopathy or easy bruising Neurological:Denies numbness, tingling or new weaknesses Behavioral/Psych: Mood is stable, no new changes  All other systems were reviewed with the patient and are negative.  I have reviewed the past medical history, past surgical history, social history and family history with the patient and they are unchanged from previous note.  ALLERGIES:  is allergic to benadryl [diphenhydramine].  MEDICATIONS:  Current Outpatient Medications  Medication Sig Dispense Refill  . acetaminophen (TYLENOL) 325 MG tablet Take 650 mg by mouth every 6 (six) hours as needed.    . ALPRAZolam (XANAX) 0.5 MG tablet Take 1 tablet (0.5 mg total) by mouth at bedtime as needed for anxiety. 60 tablet 0  . amLODipine (NORVASC) 5 MG tablet Take 1 tablet (5 mg total) by mouth daily. TAKE 1 TABLET(10 MG) BY MOUTH DAILY    . dexamethasone (DECADRON) 4 MG tablet Take 3 tabs at the night before and 3 tab the morning of chemotherapy, every 3 weeks, by mouth 36 tablet 0  . lidocaine-prilocaine  (EMLA) cream Apply to affected area once 30 g 3  . magnesium oxide (MAG-OX) 400 (241.3 Mg) MG tablet Take 1 tablet (400 mg total) by mouth 2 (two) times daily. 60 tablet 6  . morphine (MSIR) 15 MG tablet Take 1 tablet (15 mg total) by mouth every 4 (four) hours as needed for severe pain. 30 tablet 0  . ondansetron (ZOFRAN) 8 MG tablet Take 1 tablet (8 mg total) by mouth every 8 (eight) hours as needed for nausea. 30 tablet 3  . prochlorperazine (COMPAZINE) 10 MG tablet Take 1 tablet (10 mg total) by mouth every 6 (six) hours as needed for nausea or vomiting. 30 tablet 0  . sertraline (ZOLOFT) 25 MG tablet Take 1 tablet (25 mg total) by mouth daily. 30 tablet 9   No current facility-administered medications for this visit.     PHYSICAL EXAMINATION: ECOG PERFORMANCE STATUS: 1 - Symptomatic but completely ambulatory  Vitals:   10/02/18 1232  BP: (!) 126/58  Pulse: 88  Resp: 18  Temp: 98.3 F (36.8 C)  SpO2: 100%   Filed Weights   10/02/18 1232  Weight: 138 lb 6.4 oz (62.8 kg)    GENERAL:alert, no distress and comfortable SKIN: skin color, texture, turgor are normal, no rashes or significant lesions EYES: normal, Conjunctiva are pink and non-injected, sclera clear OROPHARYNX:no exudate, no erythema and lips, buccal mucosa, and tongue normal  NECK:  supple, thyroid normal size, non-tender, without nodularity LYMPH:  no palpable lymphadenopathy in the cervical, axillary or inguinal LUNGS: clear to auscultation and percussion with normal breathing effort HEART: regular rate & rhythm and no murmurs and no lower extremity edema ABDOMEN:abdomen soft, non-tender and normal bowel sounds Musculoskeletal:no cyanosis of digits and no clubbing  NEURO: alert & oriented x 3 with fluent speech, no focal motor/sensory deficits  LABORATORY DATA:  I have reviewed the data as listed    Component Value Date/Time   NA 140 10/02/2018 1133   K 4.0 10/02/2018 1133   CL 103 10/02/2018 1133   CO2 25  10/02/2018 1133   GLUCOSE 90 10/02/2018 1133   BUN 11 10/02/2018 1133   CREATININE 0.82 10/02/2018 1133   CREATININE 0.85 02/20/2018 0850   CALCIUM 9.2 10/02/2018 1133   PROT 7.5 10/02/2018 1133   ALBUMIN 4.0 10/02/2018 1133   AST 58 (H) 10/02/2018 1133   AST 22 02/20/2018 0850   ALT 28 10/02/2018 1133   ALT 19 02/20/2018 0850   ALKPHOS 376 (H) 10/02/2018 1133   BILITOT 0.5 10/02/2018 1133   BILITOT 0.6 02/20/2018 0850   GFRNONAA >60 10/02/2018 1133   GFRNONAA >60 02/20/2018 0850   GFRAA >60 10/02/2018 1133   GFRAA >60 02/20/2018 0850    No results found for: SPEP, UPEP  Lab Results  Component Value Date   WBC 7.5 10/02/2018   NEUTROABS 5.7 10/02/2018   HGB 11.3 (L) 10/02/2018   HCT 36.3 10/02/2018   MCV 90.3 10/02/2018   PLT 169 10/02/2018      Chemistry      Component Value Date/Time   NA 140 10/02/2018 1133   K 4.0 10/02/2018 1133   CL 103 10/02/2018 1133   CO2 25 10/02/2018 1133   BUN 11 10/02/2018 1133   CREATININE 0.82 10/02/2018 1133   CREATININE 0.85 02/20/2018 0850      Component Value Date/Time   CALCIUM 9.2 10/02/2018 1133   ALKPHOS 376 (H) 10/02/2018 1133   AST 58 (H) 10/02/2018 1133   AST 22 02/20/2018 0850   ALT 28 10/02/2018 1133   ALT 19 02/20/2018 0850   BILITOT 0.5 10/02/2018 1133   BILITOT 0.6 02/20/2018 0850       RADIOGRAPHIC STUDIES: I have personally reviewed the radiological images as listed and agreed with the findings in the report. Ct Abdomen Pelvis W Contrast  Result Date: 09/20/2018 CLINICAL DATA:  Cervical carcinoma with liver metastases. Ongoing immunotherapy. Restaging. EXAM: CT ABDOMEN AND PELVIS WITH CONTRAST TECHNIQUE: Multidetector CT imaging of the abdomen and pelvis was performed using the standard protocol following bolus administration of intravenous contrast. CONTRAST:  143m OMNIPAQUE IOHEXOL 300 MG/ML  SOLN COMPARISON:  06/05/2018 FINDINGS: Lower Chest: No acute findings. Hepatobiliary: Liver metastases are again  seen, with marked increase in size of a metastasis in the central right hepatic lobe. This measures 8.1 x 6.7 cm compared to 1.8 x 1.5 cm previously. Other smaller liver metastases in both right and left lobes show no significant change. Gallbladder is unremarkable. No evidence of biliary ductal dilatation. Pancreas:  No mass or inflammatory changes. Spleen: Within normal limits in size and appearance. Adrenals/Urinary Tract: No masses identified. No evidence of hydronephrosis. Stomach/Bowel: No evidence of obstruction, inflammatory process or abnormal fluid collections. Vascular/Lymphatic: Increased necrotic adenopathy is seen in the porta hepatis, with largest nodal conglomerate measuring 2.9 cm in short axis, compared to 1.2 cm previously. New mild retroperitoneal lymphadenopathy is also seen in the paraaortic  and aortocaval spaces, with largest lymph node measuring 1.6 cm in short axis on image 32/2. Mild bilateral iliac lymphadenopathy is new since previous study, largest measuring 1.6 cm in left external iliac chain, and 10 mm in the proximal left external iliac chain. Reproductive: Multiple uterine fibroids are again seen, largest measuring 9.3 cm and showing peripheral calcification. Increased fluid is seen within the endometrial cavity, consistent with hydrometros. Ill-defined low-attenuation mass in the region of the cervix currently measures 5.7 x 4.8 cm on image 78/2 compared to 5.1 x 4.8 cm previously. Another ill-defined low-attenuation mass is seen in the lower uterine segment which measures 7.4 x 6.2 cm on image 72/2, which is significantly increased in size compared to prior study. This is consistent with local recurrence of cervical carcinoma. An ovoid fluid collection is again seen in the right posterior adnexa which measures 5.2 x 2.3 cm and is unchanged since previous study. No evidence of free intraperitoneal fluid. Other:  None. Musculoskeletal:  No suspicious bone lesions identified.  IMPRESSION: 1. Increased size of large masses in the cervix and lower uterine segment, consistent with local recurrence of cervical carcinoma. This results in new moderate hydrometros. 2. Increased abdominal and pelvic lymphadenopathy, consistent with metastatic disease. 3. Markedly increased size of a central right hepatic lobe metastasis. Other smaller liver metastases show no significant change. 4. Stable large uterine fibroids. Electronically Signed   By: Earle Gell M.D.   On: 09/20/2018 15:21    All questions were answered. The patient knows to call the clinic with any problems, questions or concerns. No barriers to learning was detected.  I spent 15 minutes counseling the patient face to face. The total time spent in the appointment was 20 minutes and more than 50% was on counseling and review of test results  Heath Lark, MD 10/02/2018 2:08 PM

## 2018-10-02 NOTE — Telephone Encounter (Signed)
Spoke to pt on phone regarding elevated TSH. Pt instructed to pick up prescription for levothyroxine.  Confirmed correct pharmacy.  Pt verbalizes understanding.

## 2018-10-02 NOTE — Assessment & Plan Note (Signed)
Unfortunately, she has signs of rapid disease progression and she is symptomatic I recommend switching her treatment to carboplatin and Taxol The goal of treatment is strictly palliative in nature  We reviewed the NCCN guidelines  We discussed some of the risks, benefits, side-effects of carboplatin & Taxol. Treatment is intravenous, every 3 weeks x 6 cycles  Some of the short term side-effects included, though not limited to, including weight loss, life threatening infections, risk of allergic reactions, need for transfusions of blood products, nausea, vomiting, change in bowel habits, loss of hair, admission to hospital for various reasons, and risks of death.   Long term side-effects are also discussed including risks of infertility, permanent damage to nerve function, hearing loss, chronic fatigue, kidney damage with possibility needing hemodialysis, and rare secondary malignancy including bone marrow disorders.  The patient is aware that the response rates discussed earlier is not guaranteed.  After a long discussion, patient made an informed decision to proceed with the prescribed plan of care.   Patient education material was dispensed. We discussed premedication with dexamethasone before chemotherapy. Given her young age, she does not need G-CSF support

## 2018-10-02 NOTE — Assessment & Plan Note (Signed)
She has new cancer associated pain likely due to disease I recommend immediate release morphine to take as needed We discussed potential side effects such as sedation, nausea and constipation Her pain is stable so far.

## 2018-10-02 NOTE — Assessment & Plan Note (Signed)
She has liver metastasis and elevated liver enzymes We will monitor closely while she is on treatment We will proceed with abnormal LFT

## 2018-10-03 ENCOUNTER — Other Ambulatory Visit: Payer: Self-pay

## 2018-10-03 ENCOUNTER — Inpatient Hospital Stay: Payer: BC Managed Care – PPO

## 2018-10-03 DIAGNOSIS — C787 Secondary malignant neoplasm of liver and intrahepatic bile duct: Secondary | ICD-10-CM

## 2018-10-03 DIAGNOSIS — C530 Malignant neoplasm of endocervix: Secondary | ICD-10-CM | POA: Diagnosis not present

## 2018-10-03 DIAGNOSIS — D500 Iron deficiency anemia secondary to blood loss (chronic): Secondary | ICD-10-CM

## 2018-10-03 DIAGNOSIS — Z7189 Other specified counseling: Secondary | ICD-10-CM

## 2018-10-03 MED ORDER — PALONOSETRON HCL INJECTION 0.25 MG/5ML
INTRAVENOUS | Status: AC
  Filled 2018-10-03: qty 5

## 2018-10-03 MED ORDER — SODIUM CHLORIDE 0.9 % IV SOLN
20.0000 mg | Freq: Once | INTRAVENOUS | Status: AC
  Administered 2018-10-03: 20 mg via INTRAVENOUS
  Filled 2018-10-03: qty 2

## 2018-10-03 MED ORDER — FAMOTIDINE IN NACL 20-0.9 MG/50ML-% IV SOLN: 20.0000 mg | Freq: Once | INTRAVENOUS | Status: DC

## 2018-10-03 MED ORDER — HEPARIN SOD (PORK) LOCK FLUSH 100 UNIT/ML IV SOLN
500.0000 [IU] | Freq: Once | INTRAVENOUS | Status: AC | PRN
  Administered 2018-10-03: 500 [IU]
  Filled 2018-10-03: qty 5

## 2018-10-03 MED ORDER — SODIUM CHLORIDE 0.9 % IV SOLN
175.0000 mg/m2 | Freq: Once | INTRAVENOUS | Status: AC
  Administered 2018-10-03: 294 mg via INTRAVENOUS
  Filled 2018-10-03: qty 49

## 2018-10-03 MED ORDER — SODIUM CHLORIDE 0.9 % IV SOLN
20.0000 mg | Freq: Once | INTRAVENOUS | Status: AC
  Administered 2018-10-03: 20 mg via INTRAVENOUS
  Filled 2018-10-03: qty 20

## 2018-10-03 MED ORDER — SODIUM CHLORIDE 0.9 % IV SOLN
538.5000 mg | Freq: Once | INTRAVENOUS | Status: AC
  Administered 2018-10-03: 540 mg via INTRAVENOUS
  Filled 2018-10-03: qty 54

## 2018-10-03 MED ORDER — SODIUM CHLORIDE 0.9% FLUSH
10.0000 mL | INTRAVENOUS | Status: DC | PRN
  Administered 2018-10-03: 10 mL
  Filled 2018-10-03: qty 10

## 2018-10-03 MED ORDER — PALONOSETRON HCL INJECTION 0.25 MG/5ML
0.2500 mg | Freq: Once | INTRAVENOUS | Status: AC
  Administered 2018-10-03: 0.25 mg via INTRAVENOUS

## 2018-10-03 MED ORDER — SODIUM CHLORIDE 0.9 % IV SOLN
Freq: Once | INTRAVENOUS | Status: AC
  Administered 2018-10-03: 09:00:00 via INTRAVENOUS
  Filled 2018-10-03: qty 250

## 2018-10-03 NOTE — Patient Instructions (Signed)
  [facility] Discharge Instructions for Patients Receiving Chemotherapy  Today you received the following chemotherapy agents CARBOPLATIN AND TAXOL. To help prevent nausea and vomiting after your treatment, we encourage you to take your nausea medication as directed.    If you develop nausea and vomiting that is not controlled by your nausea medication, call the clinic.   BELOW ARE SYMPTOMS THAT SHOULD BE REPORTED IMMEDIATELY:  *FEVER GREATER THAN 100.5 F  *CHILLS WITH OR WITHOUT FEVER  NAUSEA AND VOMITING THAT IS NOT CONTROLLED WITH YOUR NAUSEA MEDICATION  *UNUSUAL SHORTNESS OF BREATH  *UNUSUAL BRUISING OR BLEEDING  TENDERNESS IN MOUTH AND THROAT WITH OR WITHOUT PRESENCE OF ULCERS  *URINARY PROBLEMS  *BOWEL PROBLEMS  UNUSUAL RASH Items with * indicate a potential emergency and should be followed up as soon as possible.  Feel free to call the clinic should you have any questions or concerns. The clinic phone number is [phone].  Please show the [place] at check-in to the Emergency Department and triage nurse.

## 2018-10-23 ENCOUNTER — Inpatient Hospital Stay: Payer: BC Managed Care – PPO

## 2018-10-23 ENCOUNTER — Other Ambulatory Visit: Payer: Self-pay

## 2018-10-23 ENCOUNTER — Inpatient Hospital Stay: Payer: BC Managed Care – PPO | Attending: Hematology and Oncology | Admitting: Hematology and Oncology

## 2018-10-23 ENCOUNTER — Encounter: Payer: Self-pay | Admitting: Hematology and Oncology

## 2018-10-23 ENCOUNTER — Telehealth: Payer: Self-pay | Admitting: Hematology and Oncology

## 2018-10-23 VITALS — BP 127/72 | HR 132 | Temp 98.6°F | Resp 18 | Ht 64.0 in | Wt 133.6 lb

## 2018-10-23 VITALS — HR 88

## 2018-10-23 DIAGNOSIS — E079 Disorder of thyroid, unspecified: Secondary | ICD-10-CM | POA: Insufficient documentation

## 2018-10-23 DIAGNOSIS — C787 Secondary malignant neoplasm of liver and intrahepatic bile duct: Secondary | ICD-10-CM | POA: Insufficient documentation

## 2018-10-23 DIAGNOSIS — F411 Generalized anxiety disorder: Secondary | ICD-10-CM | POA: Insufficient documentation

## 2018-10-23 DIAGNOSIS — Z5111 Encounter for antineoplastic chemotherapy: Secondary | ICD-10-CM | POA: Insufficient documentation

## 2018-10-23 DIAGNOSIS — Z7189 Other specified counseling: Secondary | ICD-10-CM

## 2018-10-23 DIAGNOSIS — R748 Abnormal levels of other serum enzymes: Secondary | ICD-10-CM | POA: Insufficient documentation

## 2018-10-23 DIAGNOSIS — R5383 Other fatigue: Secondary | ICD-10-CM

## 2018-10-23 DIAGNOSIS — G893 Neoplasm related pain (acute) (chronic): Secondary | ICD-10-CM | POA: Insufficient documentation

## 2018-10-23 DIAGNOSIS — Z79899 Other long term (current) drug therapy: Secondary | ICD-10-CM | POA: Insufficient documentation

## 2018-10-23 DIAGNOSIS — C530 Malignant neoplasm of endocervix: Secondary | ICD-10-CM | POA: Insufficient documentation

## 2018-10-23 DIAGNOSIS — D500 Iron deficiency anemia secondary to blood loss (chronic): Secondary | ICD-10-CM

## 2018-10-23 LAB — COMPREHENSIVE METABOLIC PANEL
ALT: 20 U/L (ref 0–44)
AST: 23 U/L (ref 15–41)
Albumin: 4.5 g/dL (ref 3.5–5.0)
Alkaline Phosphatase: 193 U/L — ABNORMAL HIGH (ref 38–126)
Anion gap: 12 (ref 5–15)
BUN: 10 mg/dL (ref 6–20)
CO2: 23 mmol/L (ref 22–32)
Calcium: 9.9 mg/dL (ref 8.9–10.3)
Chloride: 105 mmol/L (ref 98–111)
Creatinine, Ser: 0.82 mg/dL (ref 0.44–1.00)
GFR calc Af Amer: >60 mL/min (ref >60)
GFR calc non Af Amer: >60 mL/min (ref >60)
Glucose, Bld: 239 mg/dL — ABNORMAL HIGH (ref 70–99)
Potassium: 3.5 mmol/L (ref 3.5–5.1)
Sodium: 140 mmol/L (ref 135–145)
Total Bilirubin: 0.4 mg/dL (ref 0.3–1.2)
Total Protein: 8.2 g/dL — ABNORMAL HIGH (ref 6.5–8.1)

## 2018-10-23 LAB — CBC WITH DIFFERENTIAL/PLATELET
Abs Immature Granulocytes: 0.04 10*3/uL (ref 0.00–0.07)
Basophils Absolute: 0 10*3/uL (ref 0.0–0.1)
Basophils Relative: 0 %
Eosinophils Absolute: 0 10*3/uL (ref 0.0–0.5)
Eosinophils Relative: 0 %
HCT: 39.5 % (ref 36.0–46.0)
Hemoglobin: 12.6 g/dL (ref 12.0–15.0)
Immature Granulocytes: 1 %
Lymphocytes Relative: 9 %
Lymphs Abs: 0.6 10*3/uL — ABNORMAL LOW (ref 0.7–4.0)
MCH: 28.6 pg (ref 26.0–34.0)
MCHC: 31.9 g/dL (ref 30.0–36.0)
MCV: 89.8 fL (ref 80.0–100.0)
Monocytes Absolute: 0.1 10*3/uL (ref 0.1–1.0)
Monocytes Relative: 2 %
Neutro Abs: 6.3 10*3/uL (ref 1.7–7.7)
Neutrophils Relative %: 88 %
Platelets: 186 10*3/uL (ref 150–400)
RBC: 4.4 MIL/uL (ref 3.87–5.11)
RDW: 15.3 % (ref 11.5–15.5)
WBC: 7.1 10*3/uL (ref 4.0–10.5)
nRBC: 0 % (ref 0.0–0.2)

## 2018-10-23 LAB — TSH: TSH: 0.36 u[IU]/mL (ref 0.308–3.960)

## 2018-10-23 MED ORDER — PALONOSETRON HCL INJECTION 0.25 MG/5ML
INTRAVENOUS | Status: AC
  Filled 2018-10-23: qty 5

## 2018-10-23 MED ORDER — FAMOTIDINE IN NACL 20-0.9 MG/50ML-% IV SOLN: 20.0000 mg | Freq: Once | INTRAVENOUS | Status: DC

## 2018-10-23 MED ORDER — HEPARIN SOD (PORK) LOCK FLUSH 100 UNIT/ML IV SOLN
500.0000 [IU] | Freq: Once | INTRAVENOUS | Status: AC | PRN
  Administered 2018-10-23: 500 [IU]
  Filled 2018-10-23: qty 5

## 2018-10-23 MED ORDER — SODIUM CHLORIDE 0.9 % IV SOLN
20.0000 mg | Freq: Once | INTRAVENOUS | Status: AC
  Administered 2018-10-23: 20 mg via INTRAVENOUS
  Filled 2018-10-23: qty 20

## 2018-10-23 MED ORDER — PALONOSETRON HCL INJECTION 0.25 MG/5ML
0.2500 mg | Freq: Once | INTRAVENOUS | Status: AC
  Administered 2018-10-23: 0.25 mg via INTRAVENOUS

## 2018-10-23 MED ORDER — SODIUM CHLORIDE 0.9 % IV SOLN
20.0000 mg | Freq: Once | INTRAVENOUS | Status: AC
  Administered 2018-10-23: 20 mg via INTRAVENOUS
  Filled 2018-10-23: qty 2

## 2018-10-23 MED ORDER — SODIUM CHLORIDE 0.9 % IV SOLN
175.0000 mg/m2 | Freq: Once | INTRAVENOUS | Status: AC
  Administered 2018-10-23: 294 mg via INTRAVENOUS
  Filled 2018-10-23: qty 49

## 2018-10-23 MED ORDER — SODIUM CHLORIDE 0.9% FLUSH
10.0000 mL | INTRAVENOUS | Status: DC | PRN
  Administered 2018-10-23: 10 mL
  Filled 2018-10-23: qty 10

## 2018-10-23 MED ORDER — SODIUM CHLORIDE 0.9 % IV SOLN
538.5000 mg | Freq: Once | INTRAVENOUS | Status: AC
  Administered 2018-10-23: 540 mg via INTRAVENOUS
  Filled 2018-10-23: qty 54

## 2018-10-23 MED ORDER — SODIUM CHLORIDE 0.9 % IV SOLN
Freq: Once | INTRAVENOUS | Status: AC
  Administered 2018-10-23: 10:00:00 via INTRAVENOUS
  Filled 2018-10-23: qty 250

## 2018-10-23 MED ORDER — SODIUM CHLORIDE 0.9% FLUSH
10.0000 mL | Freq: Once | INTRAVENOUS | Status: AC
  Administered 2018-10-23: 10 mL
  Filled 2018-10-23: qty 10

## 2018-10-23 NOTE — Assessment & Plan Note (Signed)
She has minimum pain. I am hopeful that this is a positive sign of her responding to treatment

## 2018-10-23 NOTE — Telephone Encounter (Signed)
Scheduled per sch msg. Called and spoke with patient. Confirmed dates and times of upcoming appts. Patient will get printout from infusion

## 2018-10-23 NOTE — Assessment & Plan Note (Signed)
Overall, she tolerated chemotherapy well except for several days of diarrhea which is self-limiting She denies peripheral neuropathy or nausea Her abdominal pain/distention has improved We will continue treatment as scheduled I plan minimum 3 doses of chemotherapy before repeat imaging study in May.

## 2018-10-23 NOTE — Assessment & Plan Note (Signed)
She is tachycardic today due to anxiety She is reassured

## 2018-10-23 NOTE — Progress Notes (Signed)
Pt's serum glucose is 239.   Per pt she had chocolate cereal and fruit for breakfast.  She also takes oral steroids. Dr Alvy Bimler made aware.  No changes to treatment plan. Pt advised on diet modification. OK to proceed with tx per NG.

## 2018-10-23 NOTE — Progress Notes (Signed)
[place] OFFICE PROGRESS NOTE  Patient Care Team: Avel Sensor, MD as PCP - General (Obstetrics and Gynecology)  ASSESSMENT & PLAN:  Cervical cancer (Stephanie Fuentes) Overall, she tolerated chemotherapy well except for several days of diarrhea which is self-limiting She denies peripheral neuropathy or nausea Her abdominal pain/distention has improved We will continue treatment as scheduled I plan minimum 3 doses of chemotherapy before repeat imaging study in May.  Cancer associated pain She has minimum pain. I am hopeful that this is a positive sign of her responding to treatment  Generalized anxiety disorder She is tachycardic today due to anxiety She is reassured  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[facility]) Her right upper quadrant pain is improving Her liver enzymes has normalized except for mildly elevated alkaline phosphatase Overall, I think she is responding to treatment We will proceed with treatment as scheduled   Orders Placed This Encounter  Procedures  . CT ABDOMEN PELVIS W CONTRAST    Standing Status:   Future    Standing Expiration Date:   10/23/2019    Order Specific Question:   If indicated for the ordered procedure, I authorize the administration of contrast media per Radiology protocol    Answer:   Yes    Order Specific Question:   Preferred imaging location?    Answer:   [facility]    Order Specific Question:   Radiology Contrast Protocol - do NOT remove file path    Answer:   [email or web address]    Order Specific Question:   Is patient pregnant?    Answer:   No    INTERVAL HISTORY: Please see below for problem oriented charting. She returns for cycle 2 of chemotherapy She tolerated treatment well She has less right upper quadrant pain She had one episode of diarrhea but that has since resolved. She denies peripheral neuropathy from treatment No recent infection, fever or chills  SUMMARY OF ONCOLOGIC  HISTORY: Oncology History   She tested positive for PD-L1 25% from liver biopsy  Progressed bevacizumab and keytruda     Cervical cancer (Stephanie Fuentes)   07/22/2017 Initial Diagnosis    The patient was examined in the [facility] center.  Upon vaginal exam, friable mass of tissue presumably from the cervix was interpreted as poorly differentiated high-grade malignant neoplasm.  Follow-up workup with ultrasound of the uterus revealed multiple large uterine fibroids.    07/22/2017 Pathology Results    Biopsy confirmed poorly differentiated high-grade malignant neoplasm.    08/06/2017 Imaging    6.6 cm uterine mass in area of cervix and upper vagina, highly suspicious for cervical carcinoma.  Mild bilateral iliac lymphadenopathy, consistent with metastatic disease. No abdominal lymphadenopathy identified.  Multiple large liver masses, with areas of central necrosis or scarring, highly suspicious for liver metastases. Although unlikely, it is conceivable that some of these lesions could represent other etiology such as focal nodular hyperplasia or adenomas. Consider further evaluation with PET-CT, abdomen MRI without and with contrast, or percutaneous needle biopsy.  Multiple large uterine fibroids, many showing cystic degeneration.  Bilateral cystic adnexal lesions with probably benign features. Differential diagnosis includes ovarian cysts, endometriomas, and hydrosalpinges. These could also be further assessed on PET-CT or MRI.  No evidence of thoracic metastatic disease.     08/09/2017 Tumor Marker    Patient's tumor was tested for the following markers: CA-125 Results of the tumor marker test revealed 79.2    08/14/2017 Pathology Results    Liver, needle/core biopsy, left hepatic lobe - METASTATIC  POORLY DIFFERENTIATED CARCINOMA, SEE COMMENT. Microscopic Comment The tumor is poorly differentiated with abundant necrosis. Pancytokeratin, PAX-8, cytokeratin 7, and p16 are positive. There  is weak non-specific staining with TTF-1, synaptophysin (very focal), p63 (very focal). Chromogranin, CD56, S100, desmin, SMA, ER, CD10, cytokeratin 10, cytokeratin 5/6, and cytokeratin 20 are negative. Thus, the immunoprofile is suggest of a gynecologic primary. Dr. Lyndon Code has reviewed the case.     08/21/2017 Procedure    Ultrasound and fluoroscopically guided right internal jugular single lumen power port catheter insertion. Tip in the SVC/RA junction. Catheter ready for use.    08/30/2017 - 01/03/2018 Chemotherapy    The patient had received cisplatin and Taxol x 6 cycles    09/12/2017 - 06/26/2018 Chemotherapy    The patient had bevacizumab for chemotherapy treatment.     10/24/2017 Imaging    Interval resolution of large cervical mass and bilateral iliac lymphadenopathy since previous study.  Significant decrease in size of diffuse liver metastases since prior study. No new or progressive metastatic disease identified.  Stable probably benign cystic lesion in right adnexa. Previously seen left adnexal cystic lesion has resolved since prior study.  Stable large uterine fibroids. Increased mild right hydronephrosis due to extrinsic compression of ureter by enlarged uterus.    02/20/2018 Imaging    Interval improvement in diffuse liver metastases since prior study. No new or progressive metastatic disease.  Stable large uterine fibroids.  No significant change in tubular cystic lesion in right adnexa, suspicious for hydrosalpinx or endometrioma.    06/05/2018 Imaging    CT AP W Contrast 06/05/18 IMPRESSION: New mild lymphadenopathy in left paraaortic region and porta hepatis, and slight increase in size of right external iliac lymph node, consistent with metastatic disease.  Increased soft tissue prominence in region of cervix and right vaginal cuff, consistent with local tumor progression.  Multiple liver metastases, with mild decrease in size of several metastatic lesions.  Stable  uterine fibroids.    06/27/2018 - 08/29/2018 Chemotherapy    The patient had Keytruda    09/19/2018 Imaging    1. Increased size of large masses in the cervix and lower uterine segment, consistent with local recurrence of cervical carcinoma. This results in new moderate hydrometros. 2. Increased abdominal and pelvic lymphadenopathy, consistent with metastatic disease. 3. Markedly increased size of a central right hepatic lobe metastasis. Other smaller liver metastases show no significant change. 4. Stable large uterine fibroids.     10/03/2018 -  Chemotherapy    The patient had carboplatin and taxol      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([place])   08/09/2017 Initial Diagnosis    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([place])    09/12/2017 - 06/26/2018 Chemotherapy    The patient had bevacizumab (AVASTIN) 800 mg in sodium chloride 0.9 % 100 mL chemo infusion, 15 mg/kg = 800 mg, Intravenous,  Once, 10 of 10 cycles Dose modification: 15 mg/kg (original dose 15 mg/kg, Cycle 8, Reason: Provider Judgment), 15 mg/kg (original dose 15 mg/kg, Cycle 9, Reason: Other (see comments)) Administration: 800 mg (09/20/2017), 800 mg (11/01/2017), 800 mg (11/22/2017), 800 mg (12/13/2017), 800 mg (02/21/2018), 800 mg (04/04/2018), 1,000 mg (04/25/2018), 1,000 mg (05/16/2018), 1,000 mg (06/06/2018)  for chemotherapy treatment.     06/27/2018 - 09/21/2018 Chemotherapy    The patient had pembrolizumab (KEYTRUDA) 200 mg in sodium chloride 0.9 % 50 mL chemo infusion, 200 mg, Intravenous, Once, 4 of 5 cycles Administration: 200 mg (06/27/2018), 200 mg (07/18/2018), 200 mg (08/08/2018), 200 mg (08/29/2018)  for chemotherapy treatment.     10/03/2018 -  Chemotherapy    The patient had palonosetron (ALOXI) injection 0.25 mg, 0.25 mg, Intravenous,  Once, 2 of 4 cycles Administration: 0.25 mg (10/03/2018) CARBOplatin (PARAPLATIN) 540 mg in sodium chloride 0.9 % 250 mL chemo infusion, 540 mg (98.1 % of original dose 549 mg), Intravenous,  Once, 2 of 4 cycles Dose  modification:   (original dose 549 mg, Cycle 1) Administration: 540 mg (10/03/2018) PACLitaxel (TAXOL) 294 mg in sodium chloride 0.9 % 250 mL chemo infusion (> 60m/m2), 175 mg/m2 = 294 mg, Intravenous,  Once, 2 of 4 cycles Administration: 294 mg (10/03/2018)  for chemotherapy treatment.      REVIEW OF SYSTEMS:   Constitutional: Denies fevers, chills or abnormal weight loss Eyes: Denies blurriness of vision Ears, nose, mouth, throat, and face: Denies mucositis or sore throat Respiratory: Denies cough, dyspnea or wheezes Cardiovascular: Denies palpitation, chest discomfort or lower extremity swelling Skin: Denies abnormal skin rashes Lymphatics: Denies new lymphadenopathy or easy bruising Neurological:Denies numbness, tingling or new weaknesses Behavioral/Psych: Mood is stable, no new changes  All other systems were reviewed with the patient and are negative.  I have reviewed the past medical history, past surgical history, social history and family history with the patient and they are unchanged from previous note.  ALLERGIES:  is allergic to benadryl [diphenhydramine].  MEDICATIONS:  Current Outpatient Medications  Medication Sig Dispense Refill  . acetaminophen (TYLENOL) 325 MG tablet Take 650 mg by mouth every 6 (six) hours as needed.    . ALPRAZolam (XANAX) 0.5 MG tablet Take 1 tablet (0.5 mg total) by mouth at bedtime as needed for anxiety. 60 tablet 0  . amLODipine (NORVASC) 5 MG tablet Take 1 tablet (5 mg total) by mouth daily. TAKE 1 TABLET(10 MG) BY MOUTH DAILY    . dexamethasone (DECADRON) 4 MG tablet Take 3 tabs at the night before and 3 tab the morning of chemotherapy, every 3 weeks, by mouth 36 tablet 0  . levothyroxine (SYNTHROID, LEVOTHROID) 25 MCG tablet Take 1 tablet (25 mcg total) by mouth daily before breakfast. 30 tablet 1  . lidocaine-prilocaine (EMLA) cream Apply to affected area once 30 g 3  . magnesium oxide (MAG-OX) 400 (241.3 Mg) MG tablet Take 1 tablet (400  mg total) by mouth 2 (two) times daily. 60 tablet 6  . morphine (MSIR) 15 MG tablet Take 1 tablet (15 mg total) by mouth every 4 (four) hours as needed for severe pain. 30 tablet 0  . ondansetron (ZOFRAN) 8 MG tablet Take 1 tablet (8 mg total) by mouth every 8 (eight) hours as needed for nausea. 30 tablet 3  . prochlorperazine (COMPAZINE) 10 MG tablet Take 1 tablet (10 mg total) by mouth every 6 (six) hours as needed for nausea or vomiting. 30 tablet 0  . sertraline (ZOLOFT) 25 MG tablet Take 1 tablet (25 mg total) by mouth daily. 30 tablet 9   No current facility-administered medications for this visit.    Facility-Administered Medications Ordered in Other Visits  Medication Dose Route Frequency Provider Last Rate Last Dose  . heparin lock flush 100 unit/mL  500 Units Intracatheter Once PRN GAlvy Bimler Brittan Butterbaugh, MD      . sodium chloride flush (NS) 0.9 % injection 10 mL  10 mL Intracatheter PRN GAlvy Bimler Lawrencia Mauney, MD        PHYSICAL EXAMINATION: ECOG PERFORMANCE STATUS: 1 - Symptomatic but completely ambulatory  Vitals:   10/23/18 0928  BP: 127/72  Pulse: (Marland Kitchen  132  Resp: 18  Temp: 98.6 F (37 C)  SpO2: 100%   Filed Weights   10/23/18 0928  Weight: 133 lb 9.6 oz (60.6 kg)    GENERAL:alert, no distress and comfortable SKIN: skin color, texture, turgor are normal, no rashes or significant lesions EYES: normal, Conjunctiva are pink and non-injected, sclera clear OROPHARYNX:no exudate, no erythema and lips, buccal mucosa, and tongue normal  NECK: supple, thyroid normal size, non-tender, without nodularity LYMPH:  no palpable lymphadenopathy in the cervical, axillary or inguinal LUNGS: clear to auscultation and percussion with normal breathing effort HEART: regular rate & rhythm and no murmurs and no lower extremity edema ABDOMEN:abdomen soft, non-tender and normal bowel sounds Musculoskeletal:no cyanosis of digits and no clubbing  NEURO: alert & oriented x 3 with fluent speech, no focal  motor/sensory deficits  LABORATORY DATA:  I have reviewed the data as listed    Component Value Date/Time   NA 140 10/23/2018 0849   K 3.5 10/23/2018 0849   CL 105 10/23/2018 0849   CO2 23 10/23/2018 0849   GLUCOSE 239 (H) 10/23/2018 0849   BUN 10 10/23/2018 0849   CREATININE 0.82 10/23/2018 0849   CREATININE 0.85 02/20/2018 0850   CALCIUM 9.9 10/23/2018 0849   PROT 8.2 (H) 10/23/2018 0849   ALBUMIN 4.5 10/23/2018 0849   AST 23 10/23/2018 0849   AST 22 02/20/2018 0850   ALT 20 10/23/2018 0849   ALT 19 02/20/2018 0850   ALKPHOS 193 (H) 10/23/2018 0849   BILITOT 0.4 10/23/2018 0849   BILITOT 0.6 02/20/2018 0850   GFRNONAA >60 10/23/2018 0849   GFRNONAA >60 02/20/2018 0850   GFRAA >60 10/23/2018 0849   GFRAA >60 02/20/2018 0850    No results found for: SPEP, UPEP  Lab Results  Component Value Date   WBC 7.1 10/23/2018   NEUTROABS 6.3 10/23/2018   HGB 12.6 10/23/2018   HCT 39.5 10/23/2018   MCV 89.8 10/23/2018   PLT 186 10/23/2018      Chemistry      Component Value Date/Time   NA 140 10/23/2018 0849   K 3.5 10/23/2018 0849   CL 105 10/23/2018 0849   CO2 23 10/23/2018 0849   BUN 10 10/23/2018 0849   CREATININE 0.82 10/23/2018 0849   CREATININE 0.85 02/20/2018 0850      Component Value Date/Time   CALCIUM 9.9 10/23/2018 0849   ALKPHOS 193 (H) 10/23/2018 0849   AST 23 10/23/2018 0849   AST 22 02/20/2018 0850   ALT 20 10/23/2018 0849   ALT 19 02/20/2018 0850   BILITOT 0.4 10/23/2018 0849   BILITOT 0.6 02/20/2018 0850      All questions were answered. The patient knows to call the clinic with any problems, questions or concerns. No barriers to learning was detected.  I spent 15 minutes counseling the patient face to face. The total time spent in the appointment was 20 minutes and more than 50% was on counseling and review of test results  Heath Lark, MD 10/23/2018 9:54 AM

## 2018-10-23 NOTE — Patient Instructions (Signed)
[facility] Discharge Instructions for Patients Receiving Chemotherapy  Today you received the following chemotherapy agents:  Taxol, Carboplatin  To help prevent nausea and vomiting after your treatment, we encourage you to take your nausea medication as prescribed.   If you develop nausea and vomiting that is not controlled by your nausea medication, call the clinic.   BELOW ARE SYMPTOMS THAT SHOULD BE REPORTED IMMEDIATELY:  *FEVER GREATER THAN 100.5 F  *CHILLS WITH OR WITHOUT FEVER  NAUSEA AND VOMITING THAT IS NOT CONTROLLED WITH YOUR NAUSEA MEDICATION  *UNUSUAL SHORTNESS OF BREATH  *UNUSUAL BRUISING OR BLEEDING  TENDERNESS IN MOUTH AND THROAT WITH OR WITHOUT PRESENCE OF ULCERS  *URINARY PROBLEMS  *BOWEL PROBLEMS  UNUSUAL RASH Items with * indicate a potential emergency and should be followed up as soon as possible.  Feel free to call the clinic should you have any questions or concerns. The clinic phone number is [phone].  Please show the CHEMO ALERT CARD at check-in to the Emergency Department and triage nurse.   

## 2018-10-23 NOTE — Assessment & Plan Note (Signed)
Her right upper quadrant pain is improving Her liver enzymes has normalized except for mildly elevated alkaline phosphatase Overall, I think she is responding to treatment We will proceed with treatment as scheduled

## 2018-11-13 ENCOUNTER — Inpatient Hospital Stay: Payer: BC Managed Care – PPO

## 2018-11-13 ENCOUNTER — Inpatient Hospital Stay (HOSPITAL_BASED_OUTPATIENT_CLINIC_OR_DEPARTMENT_OTHER): Payer: BC Managed Care – PPO | Admitting: Hematology and Oncology

## 2018-11-13 ENCOUNTER — Telehealth: Payer: Self-pay | Admitting: *Deleted

## 2018-11-13 ENCOUNTER — Other Ambulatory Visit: Payer: Self-pay

## 2018-11-13 VITALS — HR 90

## 2018-11-13 DIAGNOSIS — E079 Disorder of thyroid, unspecified: Secondary | ICD-10-CM | POA: Diagnosis not present

## 2018-11-13 DIAGNOSIS — G893 Neoplasm related pain (acute) (chronic): Secondary | ICD-10-CM | POA: Diagnosis not present

## 2018-11-13 DIAGNOSIS — Z79899 Other long term (current) drug therapy: Secondary | ICD-10-CM

## 2018-11-13 DIAGNOSIS — F411 Generalized anxiety disorder: Secondary | ICD-10-CM

## 2018-11-13 DIAGNOSIS — C530 Malignant neoplasm of endocervix: Secondary | ICD-10-CM | POA: Diagnosis not present

## 2018-11-13 DIAGNOSIS — D500 Iron deficiency anemia secondary to blood loss (chronic): Secondary | ICD-10-CM

## 2018-11-13 DIAGNOSIS — C787 Secondary malignant neoplasm of liver and intrahepatic bile duct: Secondary | ICD-10-CM

## 2018-11-13 DIAGNOSIS — R5383 Other fatigue: Secondary | ICD-10-CM

## 2018-11-13 DIAGNOSIS — Z7189 Other specified counseling: Secondary | ICD-10-CM

## 2018-11-13 LAB — COMPREHENSIVE METABOLIC PANEL
ALT: 11 U/L (ref 0–44)
AST: 26 U/L (ref 15–41)
Albumin: 4.3 g/dL (ref 3.5–5.0)
Alkaline Phosphatase: 151 U/L — ABNORMAL HIGH (ref 38–126)
Anion gap: 13 (ref 5–15)
BUN: 11 mg/dL (ref 6–20)
CO2: 23 mmol/L (ref 22–32)
Calcium: 10.1 mg/dL (ref 8.9–10.3)
Chloride: 103 mmol/L (ref 98–111)
Creatinine, Ser: 0.78 mg/dL (ref 0.44–1.00)
GFR calc Af Amer: >60 mL/min (ref >60)
GFR calc non Af Amer: >60 mL/min (ref >60)
Glucose, Bld: 169 mg/dL — ABNORMAL HIGH (ref 70–99)
Potassium: 3.8 mmol/L (ref 3.5–5.1)
Sodium: 139 mmol/L (ref 135–145)
Total Bilirubin: 0.4 mg/dL (ref 0.3–1.2)
Total Protein: 8.1 g/dL (ref 6.5–8.1)

## 2018-11-13 LAB — CBC WITH DIFFERENTIAL/PLATELET
Abs Immature Granulocytes: 0.02 10*3/uL (ref 0.00–0.07)
Basophils Absolute: 0 10*3/uL (ref 0.0–0.1)
Basophils Relative: 0 %
Eosinophils Absolute: 0 10*3/uL (ref 0.0–0.5)
Eosinophils Relative: 0 %
HCT: 38.4 % (ref 36.0–46.0)
Hemoglobin: 12.5 g/dL (ref 12.0–15.0)
Immature Granulocytes: 0 %
Lymphocytes Relative: 11 %
Lymphs Abs: 0.7 10*3/uL (ref 0.7–4.0)
MCH: 29.1 pg (ref 26.0–34.0)
MCHC: 32.6 g/dL (ref 30.0–36.0)
MCV: 89.5 fL (ref 80.0–100.0)
Monocytes Absolute: 0.1 10*3/uL (ref 0.1–1.0)
Monocytes Relative: 1 %
Neutro Abs: 6 10*3/uL (ref 1.7–7.7)
Neutrophils Relative %: 88 %
Platelets: 177 10*3/uL (ref 150–400)
RBC: 4.29 MIL/uL (ref 3.87–5.11)
RDW: 16.1 % — ABNORMAL HIGH (ref 11.5–15.5)
WBC: 6.8 10*3/uL (ref 4.0–10.5)
nRBC: 0 % (ref 0.0–0.2)

## 2018-11-13 LAB — TSH: TSH: <0.08 u[IU]/mL — ABNORMAL LOW (ref 0.308–3.960)

## 2018-11-13 MED ORDER — PALONOSETRON HCL INJECTION 0.25 MG/5ML
INTRAVENOUS | Status: AC
  Filled 2018-11-13: qty 5

## 2018-11-13 MED ORDER — FAMOTIDINE IN NACL 20-0.9 MG/50ML-% IV SOLN
20.0000 mg | Freq: Once | INTRAVENOUS | Status: AC
  Administered 2018-11-13: 20 mg via INTRAVENOUS

## 2018-11-13 MED ORDER — SODIUM CHLORIDE 0.9 % IV SOLN
Freq: Once | INTRAVENOUS | Status: AC
  Administered 2018-11-13: 10:00:00 via INTRAVENOUS
  Filled 2018-11-13: qty 250

## 2018-11-13 MED ORDER — SODIUM CHLORIDE 0.9 % IV SOLN
540.0000 mg | Freq: Once | INTRAVENOUS | Status: AC
  Administered 2018-11-13: 15:00:00 540 mg via INTRAVENOUS
  Filled 2018-11-13: qty 54

## 2018-11-13 MED ORDER — PALONOSETRON HCL INJECTION 0.25 MG/5ML
0.2500 mg | Freq: Once | INTRAVENOUS | Status: AC
  Administered 2018-11-13: 11:00:00 0.25 mg via INTRAVENOUS

## 2018-11-13 MED ORDER — SODIUM CHLORIDE 0.9 % IV SOLN
20.0000 mg | Freq: Once | INTRAVENOUS | Status: AC
  Administered 2018-11-13: 11:00:00 20 mg via INTRAVENOUS
  Filled 2018-11-13: qty 20

## 2018-11-13 MED ORDER — SODIUM CHLORIDE 0.9 % IV SOLN
175.0000 mg/m2 | Freq: Once | INTRAVENOUS | Status: AC
  Administered 2018-11-13: 12:00:00 294 mg via INTRAVENOUS
  Filled 2018-11-13: qty 49

## 2018-11-13 MED ORDER — SODIUM CHLORIDE 0.9% FLUSH
10.0000 mL | Freq: Once | INTRAVENOUS | Status: AC
  Administered 2018-11-13: 10 mL
  Filled 2018-11-13: qty 10

## 2018-11-13 MED ORDER — HEPARIN SOD (PORK) LOCK FLUSH 100 UNIT/ML IV SOLN
500.0000 [IU] | Freq: Once | INTRAVENOUS | Status: AC | PRN
  Administered 2018-11-13: 16:00:00 500 [IU]
  Filled 2018-11-13: qty 5

## 2018-11-13 MED ORDER — FAMOTIDINE IN NACL 20-0.9 MG/50ML-% IV SOLN
INTRAVENOUS | Status: AC
  Filled 2018-11-13: qty 50

## 2018-11-13 MED ORDER — SODIUM CHLORIDE 0.9% FLUSH
10.0000 mL | INTRAVENOUS | Status: DC | PRN
  Administered 2018-11-13: 16:00:00 10 mL
  Filled 2018-11-13: qty 10

## 2018-11-13 NOTE — Patient Instructions (Signed)
[facility] Discharge Instructions for Patients Receiving Chemotherapy  Today you received the following chemotherapy agents:  Taxol and Carboplatin.  To help prevent nausea and vomiting after your treatment, we encourage you to take your nausea medication as prescribed.   If you develop nausea and vomiting that is not controlled by your nausea medication, call the clinic.   BELOW ARE SYMPTOMS THAT SHOULD BE REPORTED IMMEDIATELY:  *FEVER GREATER THAN 100.5 F  *CHILLS WITH OR WITHOUT FEVER  NAUSEA AND VOMITING THAT IS NOT CONTROLLED WITH YOUR NAUSEA MEDICATION  *UNUSUAL SHORTNESS OF BREATH  *UNUSUAL BRUISING OR BLEEDING  TENDERNESS IN MOUTH AND THROAT WITH OR WITHOUT PRESENCE OF ULCERS  *URINARY PROBLEMS  *BOWEL PROBLEMS  UNUSUAL RASH Items with * indicate a potential emergency and should be followed up as soon as possible.  Feel free to call the clinic should you have any questions or concerns. The clinic phone number is [phone].  Please show the [place] at check-in to the Emergency Department and triage nurse.  This information is directly available on the CDC website: RunningShows.co.za.html    Source:CDC Reference to specific commercial products, manufacturers, companies, or trademarks does not constitute its endorsement or recommendation by the [place], [place], or Centers for [employer] and [employer].

## 2018-11-13 NOTE — Telephone Encounter (Signed)
-----   Message from Heath Lark, MD sent at 11/13/2018 11:24 AM EDT ----- Regarding: TSH low Let her know her TSH is now low STop levothyroxine until we recheck next appt

## 2018-11-13 NOTE — Patient Instructions (Signed)

## 2018-11-13 NOTE — Telephone Encounter (Signed)
Telephone call to patient and discussed directions below. Patient verbalized an understanding and confirms appt in May. Patient denied any further questions or concerns at this time.

## 2018-11-14 ENCOUNTER — Encounter: Payer: Self-pay | Admitting: Hematology and Oncology

## 2018-11-14 DIAGNOSIS — E079 Disorder of thyroid, unspecified: Secondary | ICD-10-CM | POA: Insufficient documentation

## 2018-11-14 NOTE — Assessment & Plan Note (Signed)
Overall, she tolerated chemotherapy well except for several days of diarrhea which is self-limiting She denies peripheral neuropathy or nausea Her abdominal pain/distention has improved We will continue treatment as scheduled After the chemotherapy today, she will get imaging study next month for objective assessment of response to therapy

## 2018-11-14 NOTE — Assessment & Plan Note (Signed)
She has wide fluctuation of her TSH values The TSH is profoundly suppressed She is given direction to hold levothyroxine and we will continue to monitor her TSH level next appointment

## 2018-11-14 NOTE — Assessment & Plan Note (Signed)
Her tachycardia could be due to anxiety We will proceed with treatment

## 2018-11-14 NOTE — Assessment & Plan Note (Signed)
Her right upper quadrant pain is improving/resolved Her liver enzymes has normalized except for mildly elevated alkaline phosphatase Overall, I think she is responding to treatment We will proceed with treatment as scheduled

## 2018-11-14 NOTE — Progress Notes (Signed)
[place] [facility] OFFICE PROGRESS NOTE  Patient Care Team: Avel Sensor, MD as PCP - General (Obstetrics and Gynecology)  ASSESSMENT & PLAN:  Cervical cancer ([place]) Overall, she tolerated chemotherapy well except for several days of diarrhea which is self-limiting She denies peripheral neuropathy or nausea Her abdominal pain/distention has improved We will continue treatment as scheduled After the chemotherapy today, she will get imaging study next month for objective assessment of response to therapy  Generalized anxiety disorder Her tachycardia could be due to anxiety We will proceed with treatment  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[facility]) Her right upper quadrant pain is improving/resolved Her liver enzymes has normalized except for mildly elevated alkaline phosphatase Overall, I think she is responding to treatment We will proceed with treatment as scheduled  Thyroid disease She has wide fluctuation of her TSH values The TSH is profoundly suppressed She is given direction to hold levothyroxine and we will continue to monitor her TSH level next appointment   No orders of the defined types were placed in this encounter.   INTERVAL HISTORY: Please see below for problem oriented charting. She returns for chemotherapy and follow-up Since last time I saw her, she feels well She denies abdominal pain Bowel habit changes are stable No recent neuropathy Denies infection, fever or chills  SUMMARY OF ONCOLOGIC HISTORY: Oncology History   She tested positive for PD-L1 25% from liver biopsy  Progressed bevacizumab and keytruda     Cervical cancer ([place])   07/22/2017 Initial Diagnosis    The patient was examined in the [facility].  Upon vaginal exam, friable mass of tissue presumably from the cervix was interpreted as poorly differentiated high-grade malignant neoplasm.  Follow-up workup with ultrasound of the uterus revealed multiple large uterine fibroids.    07/22/2017 Pathology Results    Biopsy confirmed poorly differentiated high-grade malignant neoplasm.    08/06/2017 Imaging    6.6 cm uterine mass in area of cervix and upper vagina, highly suspicious for cervical carcinoma.  Mild bilateral iliac lymphadenopathy, consistent with metastatic disease. No abdominal lymphadenopathy identified.  Multiple large liver masses, with areas of central necrosis or scarring, highly suspicious for liver metastases. Although unlikely, it is conceivable that some of these lesions could represent other etiology such as focal nodular hyperplasia or adenomas. Consider further evaluation with PET-CT, abdomen MRI without and with contrast, or percutaneous needle biopsy.  Multiple large uterine fibroids, many showing cystic degeneration.  Bilateral cystic adnexal lesions with probably benign features. Differential diagnosis includes ovarian cysts, endometriomas, and hydrosalpinges. These could also be further assessed on PET-CT or MRI.  No evidence of thoracic metastatic disease.     08/09/2017 Tumor Marker    Patient's tumor was tested for the following markers: CA-125 Results of the tumor marker test revealed 79.2    08/14/2017 Pathology Results    Liver, needle/core biopsy, left hepatic lobe - METASTATIC POORLY DIFFERENTIATED CARCINOMA, SEE COMMENT. Microscopic Comment The tumor is poorly differentiated with abundant necrosis. Pancytokeratin, PAX-8, cytokeratin 7, and p16 are positive. There is weak non-specific staining with TTF-1, synaptophysin (very focal), p63 (very focal). Chromogranin, CD56, S100, desmin, SMA, ER, CD10, cytokeratin 10, cytokeratin 5/6, and cytokeratin 20 are negative. Thus, the immunoprofile is suggest of a gynecologic primary. Dr. Lyndon Code has reviewed the case.     08/21/2017 Procedure    Ultrasound and fluoroscopically guided right internal jugular single lumen power port catheter insertion. Tip in the SVC/RA junction. Catheter  ready for use.    08/30/2017 -  01/03/2018 Chemotherapy    The patient had received cisplatin and Taxol x 6 cycles    09/12/2017 - 06/26/2018 Chemotherapy    The patient had bevacizumab for chemotherapy treatment.     10/24/2017 Imaging    Interval resolution of large cervical mass and bilateral iliac lymphadenopathy since previous study.  Significant decrease in size of diffuse liver metastases since prior study. No new or progressive metastatic disease identified.  Stable probably benign cystic lesion in right adnexa. Previously seen left adnexal cystic lesion has resolved since prior study.  Stable large uterine fibroids. Increased mild right hydronephrosis due to extrinsic compression of ureter by enlarged uterus.    02/20/2018 Imaging    Interval improvement in diffuse liver metastases since prior study. No new or progressive metastatic disease.  Stable large uterine fibroids.  No significant change in tubular cystic lesion in right adnexa, suspicious for hydrosalpinx or endometrioma.    06/05/2018 Imaging    CT AP W Contrast 06/05/18 IMPRESSION: New mild lymphadenopathy in left paraaortic region and porta hepatis, and slight increase in size of right external iliac lymph node, consistent with metastatic disease.  Increased soft tissue prominence in region of cervix and right vaginal cuff, consistent with local tumor progression.  Multiple liver metastases, with mild decrease in size of several metastatic lesions.  Stable uterine fibroids.    06/27/2018 - 08/29/2018 Chemotherapy    The patient had Keytruda    09/19/2018 Imaging    1. Increased size of large masses in the cervix and lower uterine segment, consistent with local recurrence of cervical carcinoma. This results in new moderate hydrometros. 2. Increased abdominal and pelvic lymphadenopathy, consistent with metastatic disease. 3. Markedly increased size of a central right hepatic lobe metastasis. Other smaller liver  metastases show no significant change. 4. Stable large uterine fibroids.     10/03/2018 -  Chemotherapy    The patient had carboplatin and taxol      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([place])   08/09/2017 Initial Diagnosis    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([place])    09/12/2017 - 06/26/2018 Chemotherapy    The patient had bevacizumab (AVASTIN) 800 mg in sodium chloride 0.9 % 100 mL chemo infusion, 15 mg/kg = 800 mg, Intravenous,  Once, 10 of 10 cycles Dose modification: 15 mg/kg (original dose 15 mg/kg, Cycle 8, Reason: Provider Judgment), 15 mg/kg (original dose 15 mg/kg, Cycle 9, Reason: Other (see comments)) Administration: 800 mg (09/20/2017), 800 mg (11/01/2017), 800 mg (11/22/2017), 800 mg (12/13/2017), 800 mg (02/21/2018), 800 mg (04/04/2018), 1,000 mg (04/25/2018), 1,000 mg (05/16/2018), 1,000 mg (06/06/2018)  for chemotherapy treatment.     06/27/2018 - 09/21/2018 Chemotherapy    The patient had pembrolizumab (KEYTRUDA) 200 mg in sodium chloride 0.9 % 50 mL chemo infusion, 200 mg, Intravenous, Once, 4 of 5 cycles Administration: 200 mg (06/27/2018), 200 mg (07/18/2018), 200 mg (08/08/2018), 200 mg (08/29/2018)  for chemotherapy treatment.     10/03/2018 -  Chemotherapy    The patient had palonosetron (ALOXI) injection 0.25 mg, 0.25 mg, Intravenous,  Once, 3 of 4 cycles Administration: 0.25 mg (10/03/2018), 0.25 mg (10/23/2018), 0.25 mg (11/13/2018) CARBOplatin (PARAPLATIN) 540 mg in sodium chloride 0.9 % 250 mL chemo infusion, 540 mg (98.1 % of original dose 549 mg), Intravenous,  Once, 3 of 4 cycles Dose modification:   (original dose 549 mg, Cycle 1) Administration: 540 mg (10/03/2018), 540 mg (10/23/2018), 540 mg (11/13/2018) PACLitaxel (TAXOL) 294 mg in sodium chloride 0.9 % 250 mL chemo infusion (>  74m/m2), 175 mg/m2 = 294 mg, Intravenous,  Once, 3 of 4 cycles Administration: 294 mg (10/03/2018), 294 mg (10/23/2018), 294 mg (11/13/2018)  for chemotherapy treatment.      REVIEW OF SYSTEMS:   Constitutional: Denies  fevers, chills or abnormal weight loss Eyes: Denies blurriness of vision Ears, nose, mouth, throat, and face: Denies mucositis or sore throat Respiratory: Denies cough, dyspnea or wheezes Cardiovascular: Denies palpitation, chest discomfort or lower extremity swelling Gastrointestinal:  Denies nausea, heartburn or change in bowel habits Skin: Denies abnormal skin rashes Lymphatics: Denies new lymphadenopathy or easy bruising Neurological:Denies numbness, tingling or new weaknesses Behavioral/Psych: Mood is stable, no new changes  All other systems were reviewed with the patient and are negative.  I have reviewed the past medical history, past surgical history, social history and family history with the patient and they are unchanged from previous note.  ALLERGIES:  is allergic to benadryl [diphenhydramine].  MEDICATIONS:  Current Outpatient Medications  Medication Sig Dispense Refill  . acetaminophen (TYLENOL) 325 MG tablet Take 650 mg by mouth every 6 (six) hours as needed.    . ALPRAZolam (XANAX) 0.5 MG tablet Take 1 tablet (0.5 mg total) by mouth at bedtime as needed for anxiety. 60 tablet 0  . amLODipine (NORVASC) 5 MG tablet Take 1 tablet (5 mg total) by mouth daily. TAKE 1 TABLET(10 MG) BY MOUTH DAILY    . dexamethasone (DECADRON) 4 MG tablet Take 3 tabs at the night before and 3 tab the morning of chemotherapy, every 3 weeks, by mouth 36 tablet 0  . levothyroxine (SYNTHROID, LEVOTHROID) 25 MCG tablet Take 1 tablet (25 mcg total) by mouth daily before breakfast. 30 tablet 1  . lidocaine-prilocaine (EMLA) cream Apply to affected area once 30 g 3  . magnesium oxide (MAG-OX) 400 (241.3 Mg) MG tablet Take 1 tablet (400 mg total) by mouth 2 (two) times daily. 60 tablet 6  . morphine (MSIR) 15 MG tablet Take 1 tablet (15 mg total) by mouth every 4 (four) hours as needed for severe pain. 30 tablet 0  . ondansetron (ZOFRAN) 8 MG tablet Take 1 tablet (8 mg total) by mouth every 8 (eight)  hours as needed for nausea. 30 tablet 3  . prochlorperazine (COMPAZINE) 10 MG tablet Take 1 tablet (10 mg total) by mouth every 6 (six) hours as needed for nausea or vomiting. 30 tablet 0  . sertraline (ZOLOFT) 25 MG tablet Take 1 tablet (25 mg total) by mouth daily. 30 tablet 9   No current facility-administered medications for this visit.     PHYSICAL EXAMINATION: ECOG PERFORMANCE STATUS: 0 - Asymptomatic  Vitals:   11/13/18 0938  BP: 128/61  Pulse: (!) 136  Resp: 18  Temp: 98 F (36.7 C)  SpO2: 100%   Filed Weights   11/13/18 0938  Weight: 133 lb 9.6 oz (60.6 kg)    GENERAL:alert, no distress and comfortable SKIN: skin color, texture, turgor are normal, no rashes or significant lesions EYES: normal, Conjunctiva are pink and non-injected, sclera clear OROPHARYNX:no exudate, no erythema and lips, buccal mucosa, and tongue normal  NECK: supple, thyroid normal size, non-tender, without nodularity LYMPH:  no palpable lymphadenopathy in the cervical, axillary or inguinal LUNGS: clear to auscultation and percussion with normal breathing effort HEART: regular rate & rhythm and no murmurs and no lower extremity edema ABDOMEN:abdomen soft, non-tender and normal bowel sounds Musculoskeletal:no cyanosis of digits and no clubbing  NEURO: alert & oriented x 3 with fluent speech, no focal motor/sensory  deficits  LABORATORY DATA:  I have reviewed the data as listed    Component Value Date/Time   NA 139 11/13/2018 0841   K 3.8 11/13/2018 0841   CL 103 11/13/2018 0841   CO2 23 11/13/2018 0841   GLUCOSE 169 (H) 11/13/2018 0841   BUN 11 11/13/2018 0841   CREATININE 0.78 11/13/2018 0841   CREATININE 0.85 02/20/2018 0850   CALCIUM 10.1 11/13/2018 0841   PROT 8.1 11/13/2018 0841   ALBUMIN 4.3 11/13/2018 0841   AST 26 11/13/2018 0841   AST 22 02/20/2018 0850   ALT 11 11/13/2018 0841   ALT 19 02/20/2018 0850   ALKPHOS 151 (H) 11/13/2018 0841   BILITOT 0.4 11/13/2018 0841   BILITOT  0.6 02/20/2018 0850   GFRNONAA >60 11/13/2018 0841   GFRNONAA >60 02/20/2018 0850   GFRAA >60 11/13/2018 0841   GFRAA >60 02/20/2018 0850    No results found for: SPEP, UPEP  Lab Results  Component Value Date   WBC 6.8 11/13/2018   NEUTROABS 6.0 11/13/2018   HGB 12.5 11/13/2018   HCT 38.4 11/13/2018   MCV 89.5 11/13/2018   PLT 177 11/13/2018      Chemistry      Component Value Date/Time   NA 139 11/13/2018 0841   K 3.8 11/13/2018 0841   CL 103 11/13/2018 0841   CO2 23 11/13/2018 0841   BUN 11 11/13/2018 0841   CREATININE 0.78 11/13/2018 0841   CREATININE 0.85 02/20/2018 0850      Component Value Date/Time   CALCIUM 10.1 11/13/2018 0841   ALKPHOS 151 (H) 11/13/2018 0841   AST 26 11/13/2018 0841   AST 22 02/20/2018 0850   ALT 11 11/13/2018 0841   ALT 19 02/20/2018 0850   BILITOT 0.4 11/13/2018 0841   BILITOT 0.6 02/20/2018 0850       All questions were answered. The patient knows to call the clinic with any problems, questions or concerns. No barriers to learning was detected.  I spent 15 minutes counseling the patient face to face. The total time spent in the appointment was 20 minutes and more than 50% was on counseling and review of test results  Heath Lark, MD 11/14/2018 9:53 AM

## 2018-12-04 ENCOUNTER — Inpatient Hospital Stay: Payer: Medicaid Other

## 2018-12-04 ENCOUNTER — Other Ambulatory Visit: Payer: Self-pay

## 2018-12-04 ENCOUNTER — Inpatient Hospital Stay: Payer: Medicaid Other | Attending: Hematology and Oncology

## 2018-12-04 ENCOUNTER — Ambulatory Visit (HOSPITAL_COMMUNITY): Payer: BC Managed Care – PPO

## 2018-12-04 ENCOUNTER — Ambulatory Visit (HOSPITAL_COMMUNITY): Admission: RE | Admit: 2018-12-04 | Payer: BC Managed Care – PPO | Source: Ambulatory Visit

## 2018-12-04 ENCOUNTER — Encounter (HOSPITAL_COMMUNITY): Payer: Self-pay

## 2018-12-04 ENCOUNTER — Ambulatory Visit (HOSPITAL_COMMUNITY)
Admission: RE | Admit: 2018-12-04 | Discharge: 2018-12-04 | Disposition: A | Discharge status: Home / Self Care | Payer: Medicaid Other | Source: Ambulatory Visit | Attending: Hematology and Oncology | Admitting: Hematology and Oncology

## 2018-12-04 DIAGNOSIS — F411 Generalized anxiety disorder: Secondary | ICD-10-CM | POA: Insufficient documentation

## 2018-12-04 DIAGNOSIS — C539 Malignant neoplasm of cervix uteri, unspecified: Secondary | ICD-10-CM | POA: Insufficient documentation

## 2018-12-04 DIAGNOSIS — C530 Malignant neoplasm of endocervix: Secondary | ICD-10-CM

## 2018-12-04 DIAGNOSIS — C787 Secondary malignant neoplasm of liver and intrahepatic bile duct: Secondary | ICD-10-CM | POA: Diagnosis not present

## 2018-12-04 DIAGNOSIS — R5383 Other fatigue: Secondary | ICD-10-CM

## 2018-12-04 LAB — COMPREHENSIVE METABOLIC PANEL
ALT: 13 U/L (ref 0–44)
AST: 26 U/L (ref 15–41)
Albumin: 4 g/dL (ref 3.5–5.0)
Alkaline Phosphatase: 113 U/L (ref 38–126)
Anion gap: 10 (ref 5–15)
BUN: 8 mg/dL (ref 6–20)
CO2: 27 mmol/L (ref 22–32)
Calcium: 9.5 mg/dL (ref 8.9–10.3)
Chloride: 105 mmol/L (ref 98–111)
Creatinine, Ser: 0.75 mg/dL (ref 0.44–1.00)
GFR calc Af Amer: >60 mL/min (ref >60)
GFR calc non Af Amer: >60 mL/min (ref >60)
Glucose, Bld: 93 mg/dL (ref 70–99)
Potassium: 3.8 mmol/L (ref 3.5–5.1)
Sodium: 142 mmol/L (ref 135–145)
Total Bilirubin: 0.3 mg/dL (ref 0.3–1.2)
Total Protein: 7.4 g/dL (ref 6.5–8.1)

## 2018-12-04 LAB — CBC WITH DIFFERENTIAL/PLATELET
Abs Immature Granulocytes: 0.02 10*3/uL (ref 0.00–0.07)
Basophils Absolute: 0 10*3/uL (ref 0.0–0.1)
Basophils Relative: 0 %
Eosinophils Absolute: 0 10*3/uL (ref 0.0–0.5)
Eosinophils Relative: 0 %
HCT: 36.5 % (ref 36.0–46.0)
Hemoglobin: 11.7 g/dL — ABNORMAL LOW (ref 12.0–15.0)
Immature Granulocytes: 0 %
Lymphocytes Relative: 17 %
Lymphs Abs: 1.3 10*3/uL (ref 0.7–4.0)
MCH: 30.2 pg (ref 26.0–34.0)
MCHC: 32.1 g/dL (ref 30.0–36.0)
MCV: 94.1 fL (ref 80.0–100.0)
Monocytes Absolute: 0.7 10*3/uL (ref 0.1–1.0)
Monocytes Relative: 9 %
Neutro Abs: 5.7 10*3/uL (ref 1.7–7.7)
Neutrophils Relative %: 74 %
Platelets: 159 10*3/uL (ref 150–400)
RBC: 3.88 MIL/uL (ref 3.87–5.11)
RDW: 18.1 % — ABNORMAL HIGH (ref 11.5–15.5)
WBC: 7.7 10*3/uL (ref 4.0–10.5)
nRBC: 0 % (ref 0.0–0.2)

## 2018-12-04 LAB — TSH: TSH: 1.344 u[IU]/mL (ref 0.308–3.960)

## 2018-12-04 MED ORDER — IOHEXOL 300 MG/ML  SOLN
100.0000 mL | Freq: Once | INTRAMUSCULAR | Status: AC | PRN
  Administered 2018-12-04: 12:00:00 100 mL via INTRAVENOUS

## 2018-12-04 MED ORDER — HEPARIN SOD (PORK) LOCK FLUSH 100 UNIT/ML IV SOLN
INTRAVENOUS | Status: AC
  Administered 2018-12-04: 12:00:00 500 [IU] via INTRAVENOUS
  Filled 2018-12-04: qty 5

## 2018-12-04 MED ORDER — HEPARIN SOD (PORK) LOCK FLUSH 100 UNIT/ML IV SOLN
500.0000 [IU] | Freq: Once | INTRAVENOUS | Status: AC
  Administered 2018-12-04: 12:00:00 500 [IU] via INTRAVENOUS

## 2018-12-04 MED ORDER — SODIUM CHLORIDE 0.9% FLUSH
10.0000 mL | Freq: Once | INTRAVENOUS | Status: AC
  Administered 2018-12-04: 10 mL
  Filled 2018-12-04: qty 10

## 2018-12-04 MED ORDER — SODIUM CHLORIDE (PF) 0.9 % IJ SOLN
INTRAMUSCULAR | Status: AC
  Filled 2018-12-04: qty 50

## 2018-12-05 ENCOUNTER — Inpatient Hospital Stay (HOSPITAL_BASED_OUTPATIENT_CLINIC_OR_DEPARTMENT_OTHER): Payer: Medicaid Other | Admitting: Hematology and Oncology

## 2018-12-05 ENCOUNTER — Other Ambulatory Visit: Payer: Self-pay

## 2018-12-05 ENCOUNTER — Encounter: Payer: Self-pay | Admitting: Hematology and Oncology

## 2018-12-05 ENCOUNTER — Inpatient Hospital Stay: Payer: Medicaid Other

## 2018-12-05 VITALS — HR 99

## 2018-12-05 DIAGNOSIS — C539 Malignant neoplasm of cervix uteri, unspecified: Secondary | ICD-10-CM

## 2018-12-05 DIAGNOSIS — Z7189 Other specified counseling: Secondary | ICD-10-CM

## 2018-12-05 DIAGNOSIS — F411 Generalized anxiety disorder: Secondary | ICD-10-CM | POA: Diagnosis not present

## 2018-12-05 DIAGNOSIS — C787 Secondary malignant neoplasm of liver and intrahepatic bile duct: Secondary | ICD-10-CM

## 2018-12-05 DIAGNOSIS — D500 Iron deficiency anemia secondary to blood loss (chronic): Secondary | ICD-10-CM

## 2018-12-05 DIAGNOSIS — C530 Malignant neoplasm of endocervix: Secondary | ICD-10-CM

## 2018-12-05 MED ORDER — PALONOSETRON HCL INJECTION 0.25 MG/5ML
INTRAVENOUS | Status: AC
  Filled 2018-12-05: qty 5

## 2018-12-05 MED ORDER — SODIUM CHLORIDE 0.9 % IV SOLN
Freq: Once | INTRAVENOUS | Status: AC
  Administered 2018-12-05: 10:00:00 via INTRAVENOUS
  Filled 2018-12-05: qty 250

## 2018-12-05 MED ORDER — SODIUM CHLORIDE 0.9% FLUSH
10.0000 mL | INTRAVENOUS | Status: DC | PRN
  Administered 2018-12-05: 15:00:00 10 mL
  Filled 2018-12-05: qty 10

## 2018-12-05 MED ORDER — ALPRAZOLAM 0.5 MG PO TABS: 0.5000 mg | ORAL_TABLET | Freq: Every evening | ORAL | 0 refills | Status: AC | PRN

## 2018-12-05 MED ORDER — SODIUM CHLORIDE 0.9 % IV SOLN
20.0000 mg | Freq: Once | INTRAVENOUS | Status: AC
  Administered 2018-12-05: 10:00:00 20 mg via INTRAVENOUS
  Filled 2018-12-05: qty 20

## 2018-12-05 MED ORDER — SODIUM CHLORIDE 0.9 % IV SOLN
175.0000 mg/m2 | Freq: Once | INTRAVENOUS | Status: AC
  Administered 2018-12-05: 11:00:00 294 mg via INTRAVENOUS
  Filled 2018-12-05: qty 49

## 2018-12-05 MED ORDER — FAMOTIDINE IN NACL 20-0.9 MG/50ML-% IV SOLN
INTRAVENOUS | Status: AC
  Filled 2018-12-05: qty 50

## 2018-12-05 MED ORDER — SODIUM CHLORIDE 0.9 % IV SOLN
540.0000 mg | Freq: Once | INTRAVENOUS | Status: AC
  Administered 2018-12-05: 14:00:00 540 mg via INTRAVENOUS
  Filled 2018-12-05: qty 54

## 2018-12-05 MED ORDER — HEPARIN SOD (PORK) LOCK FLUSH 100 UNIT/ML IV SOLN
500.0000 [IU] | Freq: Once | INTRAVENOUS | Status: AC | PRN
  Administered 2018-12-05: 15:00:00 500 [IU]
  Filled 2018-12-05: qty 5

## 2018-12-05 MED ORDER — DEXAMETHASONE 4 MG PO TABS: ORAL_TABLET | ORAL | 0 refills | Status: AC

## 2018-12-05 MED ORDER — PALONOSETRON HCL INJECTION 0.25 MG/5ML
0.2500 mg | Freq: Once | INTRAVENOUS | Status: AC
  Administered 2018-12-05: 10:00:00 0.25 mg via INTRAVENOUS

## 2018-12-05 MED ORDER — FAMOTIDINE IN NACL 20-0.9 MG/50ML-% IV SOLN
20.0000 mg | Freq: Once | INTRAVENOUS | Status: AC
  Administered 2018-12-05: 20 mg via INTRAVENOUS

## 2018-12-05 NOTE — Progress Notes (Signed)
[place] OFFICE PROGRESS NOTE  Patient Care Team: Avel Sensor, MD as PCP - General (Obstetrics and Gynecology)  ASSESSMENT & PLAN:  Cervical cancer [facility]) I have reviewed blood work and imaging studies with the patient She has excellent response to therapy Her liver enzymes are completely normal We will proceed with 3 more cycles of treatment before repeating another imaging study She agreed with the plan of care  Generalized anxiety disorder The patient is reassured with excellent response to treatment She will continue alprazolam as needed She appears to be coping well with treatment  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[facility]) She has remarkable response to therapy with normalization of liver enzymes and near complete response on imaging study We will continue treatment as directed  Goals of care, counseling/discussion The patient understood the goals of treatment is palliative She is not able to work due to stage IV disease.   No orders of the defined types were placed in this encounter.   INTERVAL HISTORY: Please see below for problem oriented charting. She is seen prior to cycle 4 of chemotherapy and to review CT imaging She feels well She is quite anxious today Denies abdominal pain No peripheral neuropathy Denies recent nausea or vomiting.  SUMMARY OF ONCOLOGIC HISTORY: Oncology History   She tested positive for PD-L1 25% from liver biopsy  Progressed bevacizumab and keytruda     Cervical cancer ([place])   07/22/2017 Initial Diagnosis    The patient was examined in the wellness center.  Upon vaginal exam, friable mass of tissue presumably from the cervix was interpreted as poorly differentiated high-grade malignant neoplasm.  Follow-up workup with ultrasound of the uterus revealed multiple large uterine fibroids.    07/22/2017 Pathology Results    Biopsy confirmed poorly differentiated high-grade malignant neoplasm.    08/06/2017 Imaging    6.6 cm  uterine mass in area of cervix and upper vagina, highly suspicious for cervical carcinoma.  Mild bilateral iliac lymphadenopathy, consistent with metastatic disease. No abdominal lymphadenopathy identified.  Multiple large liver masses, with areas of central necrosis or scarring, highly suspicious for liver metastases. Although unlikely, it is conceivable that some of these lesions could represent other etiology such as focal nodular hyperplasia or adenomas. Consider further evaluation with PET-CT, abdomen MRI without and with contrast, or percutaneous needle biopsy.  Multiple large uterine fibroids, many showing cystic degeneration.  Bilateral cystic adnexal lesions with probably benign features. Differential diagnosis includes ovarian cysts, endometriomas, and hydrosalpinges. These could also be further assessed on PET-CT or MRI.  No evidence of thoracic metastatic disease.     08/09/2017 Tumor Marker    Patient's tumor was tested for the following markers: CA-125 Results of the tumor marker test revealed 79.2    08/14/2017 Pathology Results    Liver, needle/core biopsy, left hepatic lobe - METASTATIC POORLY DIFFERENTIATED CARCINOMA, SEE COMMENT. Microscopic Comment The tumor is poorly differentiated with abundant necrosis. Pancytokeratin, PAX-8, cytokeratin 7, and p16 are positive. There is weak non-specific staining with TTF-1, synaptophysin (very focal), p63 (very focal). Chromogranin, CD56, S100, desmin, SMA, ER, CD10, cytokeratin 10, cytokeratin 5/6, and cytokeratin 20 are negative. Thus, the immunoprofile is suggest of a gynecologic primary. Dr. Lyndon Code has reviewed the case.     08/21/2017 Procedure    Ultrasound and fluoroscopically guided right internal jugular single lumen power port catheter insertion. Tip in the SVC/RA junction. Catheter ready for use.    08/30/2017 - 01/03/2018 Chemotherapy    The patient had received cisplatin and  Taxol x 6 cycles    09/12/2017 - 06/26/2018  Chemotherapy    The patient had bevacizumab for chemotherapy treatment.     10/24/2017 Imaging    Interval resolution of large cervical mass and bilateral iliac lymphadenopathy since previous study.  Significant decrease in size of diffuse liver metastases since prior study. No new or progressive metastatic disease identified.  Stable probably benign cystic lesion in right adnexa. Previously seen left adnexal cystic lesion has resolved since prior study.  Stable large uterine fibroids. Increased mild right hydronephrosis due to extrinsic compression of ureter by enlarged uterus.    02/20/2018 Imaging    Interval improvement in diffuse liver metastases since prior study. No new or progressive metastatic disease.  Stable large uterine fibroids.  No significant change in tubular cystic lesion in right adnexa, suspicious for hydrosalpinx or endometrioma.    06/05/2018 Imaging    CT AP W Contrast 06/05/18 IMPRESSION: New mild lymphadenopathy in left paraaortic region and porta hepatis, and slight increase in size of right external iliac lymph node, consistent with metastatic disease.  Increased soft tissue prominence in region of cervix and right vaginal cuff, consistent with local tumor progression.  Multiple liver metastases, with mild decrease in size of several metastatic lesions.  Stable uterine fibroids.    06/27/2018 - 08/29/2018 Chemotherapy    The patient had Keytruda    09/19/2018 Imaging    1. Increased size of large masses in the cervix and lower uterine segment, consistent with local recurrence of cervical carcinoma. This results in new moderate hydrometros. 2. Increased abdominal and pelvic lymphadenopathy, consistent with metastatic disease. 3. Markedly increased size of a central right hepatic lobe metastasis. Other smaller liver metastases show no significant change. 4. Stable large uterine fibroids.     10/03/2018 -  Chemotherapy    The patient had carboplatin and  taxol     12/04/2018 Imaging    1. Interval decrease in size of liver metastases since previous study. 2. Resolution of porta hepatis lymphadenopathy since prior study. 3. No new or progressive metastatic disease. 4. Stable large calcified uterine fibroids.      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([place])   08/09/2017 Initial Diagnosis    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[facility])    09/12/2017 - 06/26/2018 Chemotherapy    The patient had bevacizumab (AVASTIN) 800 mg in sodium chloride 0.9 % 100 mL chemo infusion, 15 mg/kg = 800 mg, Intravenous,  Once, 10 of 10 cycles Dose modification: 15 mg/kg (original dose 15 mg/kg, Cycle 8, Reason: Provider Judgment), 15 mg/kg (original dose 15 mg/kg, Cycle 9, Reason: Other (see comments)) Administration: 800 mg (09/20/2017), 800 mg (11/01/2017), 800 mg (11/22/2017), 800 mg (12/13/2017), 800 mg (02/21/2018), 800 mg (04/04/2018), 1,000 mg (04/25/2018), 1,000 mg (05/16/2018), 1,000 mg (06/06/2018)  for chemotherapy treatment.     06/27/2018 - 09/21/2018 Chemotherapy    The patient had pembrolizumab (KEYTRUDA) 200 mg in sodium chloride 0.9 % 50 mL chemo infusion, 200 mg, Intravenous, Once, 4 of 5 cycles Administration: 200 mg (06/27/2018), 200 mg (07/18/2018), 200 mg (08/08/2018), 200 mg (08/29/2018)  for chemotherapy treatment.     10/03/2018 -  Chemotherapy    The patient had palonosetron (ALOXI) injection 0.25 mg, 0.25 mg, Intravenous,  Once, 4 of 7 cycles Administration: 0.25 mg (10/03/2018), 0.25 mg (10/23/2018), 0.25 mg (11/13/2018) CARBOplatin (PARAPLATIN) 540 mg in sodium chloride 0.9 % 250 mL chemo infusion, 540 mg (98.1 % of original dose 549 mg), Intravenous,  Once, 4 of 7 cycles Dose modification:   (  original dose 549 mg, Cycle 1) Administration: 540 mg (10/03/2018), 540 mg (10/23/2018), 540 mg (11/13/2018) PACLitaxel (TAXOL) 294 mg in sodium chloride 0.9 % 250 mL chemo infusion (> 34m/m2), 175 mg/m2 = 294 mg, Intravenous,  Once, 4 of 7 cycles Administration: 294 mg (10/03/2018), 294 mg  (10/23/2018), 294 mg (11/13/2018)  for chemotherapy treatment.      REVIEW OF SYSTEMS:   Constitutional: Denies fevers, chills or abnormal weight loss Eyes: Denies blurriness of vision Ears, nose, mouth, throat, and face: Denies mucositis or sore throat Respiratory: Denies cough, dyspnea or wheezes Cardiovascular: Denies palpitation, chest discomfort or lower extremity swelling Gastrointestinal:  Denies nausea, heartburn or change in bowel habits Skin: Denies abnormal skin rashes Lymphatics: Denies new lymphadenopathy or easy bruising Neurological:Denies numbness, tingling or new weaknesses Behavioral/Psych: Mood is stable, no new changes  All other systems were reviewed with the patient and are negative.  I have reviewed the past medical history, past surgical history, social history and family history with the patient and they are unchanged from previous note.  ALLERGIES:  is allergic to benadryl [diphenhydramine].  MEDICATIONS:  Current Outpatient Medications  Medication Sig Dispense Refill  . acetaminophen (TYLENOL) 325 MG tablet Take 650 mg by mouth every 6 (six) hours as needed.    . ALPRAZolam (XANAX) 0.5 MG tablet Take 1 tablet (0.5 mg total) by mouth at bedtime as needed for anxiety. 60 tablet 0  . amLODipine (NORVASC) 5 MG tablet Take 1 tablet (5 mg total) by mouth daily. TAKE 1 TABLET(10 MG) BY MOUTH DAILY    . dexamethasone (DECADRON) 4 MG tablet Take 2 tabs at the night before and 2 tab the morning of chemotherapy, every 3 weeks, by mouth 12 tablet 0  . levothyroxine (SYNTHROID, LEVOTHROID) 25 MCG tablet Take 1 tablet (25 mcg total) by mouth daily before breakfast. 30 tablet 1  . lidocaine-prilocaine (EMLA) cream Apply to affected area once 30 g 3  . magnesium oxide (MAG-OX) 400 (241.3 Mg) MG tablet Take 1 tablet (400 mg total) by mouth 2 (two) times daily. 60 tablet 6  . morphine (MSIR) 15 MG tablet Take 1 tablet (15 mg total) by mouth every 4 (four) hours as needed for  severe pain. 30 tablet 0  . ondansetron (ZOFRAN) 8 MG tablet Take 1 tablet (8 mg total) by mouth every 8 (eight) hours as needed for nausea. 30 tablet 3  . prochlorperazine (COMPAZINE) 10 MG tablet Take 1 tablet (10 mg total) by mouth every 6 (six) hours as needed for nausea or vomiting. 30 tablet 0  . sertraline (ZOLOFT) 25 MG tablet Take 1 tablet (25 mg total) by mouth daily. 30 tablet 9   No current facility-administered medications for this visit.    Facility-Administered Medications Ordered in Other Visits  Medication Dose Route Frequency Provider Last Rate Last Dose  . CARBOplatin (PARAPLATIN) 540 mg in sodium chloride 0.9 % 250 mL chemo infusion  540 mg Intravenous Once GAlvy Bimler Raunel Dimartino, MD      . heparin lock flush 100 unit/mL  500 Units Intracatheter Once PRN GAlvy Bimler Courtney Bellizzi, MD      . PACLitaxel (TAXOL) 294 mg in sodium chloride 0.9 % 250 mL chemo infusion (> 81mm2)  175 mg/m2 (Treatment Plan Recorded) Intravenous Once GoAlvy BimlerNi, MD 100 mL/hr at 12/05/18 1054 294 mg at 12/05/18 1054  . sodium chloride flush (NS) 0.9 % injection 10 mL  10 mL Intracatheter PRN GoHeath LarkMD        PHYSICAL EXAMINATION: ECOG  PERFORMANCE STATUS: 1 - Symptomatic but completely ambulatory  Vitals:   12/05/18 0851  BP: 119/63  Pulse: (!) 137  Resp: 18  Temp: 98.3 F (36.8 C)  SpO2: 100%   Filed Weights   12/05/18 0851  Weight: 133 lb (60.3 kg)    GENERAL:alert, no distress and comfortable SKIN: skin color, texture, turgor are normal, no rashes or significant lesions EYES: normal, Conjunctiva are pink and non-injected, sclera clear OROPHARYNX:no exudate, no erythema and lips, buccal mucosa, and tongue normal  NECK: supple, thyroid normal size, non-tender, without nodularity LYMPH:  no palpable lymphadenopathy in the cervical, axillary or inguinal LUNGS: clear to auscultation and percussion with normal breathing effort HEART: regular rate & rhythm and no murmurs and no lower extremity  edema ABDOMEN:abdomen soft, non-tender and normal bowel sounds Musculoskeletal:no cyanosis of digits and no clubbing  NEURO: alert & oriented x 3 with fluent speech, no focal motor/sensory deficits  LABORATORY DATA:  I have reviewed the data as listed    Component Value Date/Time   NA 142 12/04/2018 1022   K 3.8 12/04/2018 1022   CL 105 12/04/2018 1022   CO2 27 12/04/2018 1022   GLUCOSE 93 12/04/2018 1022   BUN 8 12/04/2018 1022   CREATININE 0.75 12/04/2018 1022   CREATININE 0.85 02/20/2018 0850   CALCIUM 9.5 12/04/2018 1022   PROT 7.4 12/04/2018 1022   ALBUMIN 4.0 12/04/2018 1022   AST 26 12/04/2018 1022   AST 22 02/20/2018 0850   ALT 13 12/04/2018 1022   ALT 19 02/20/2018 0850   ALKPHOS 113 12/04/2018 1022   BILITOT 0.3 12/04/2018 1022   BILITOT 0.6 02/20/2018 0850   GFRNONAA >60 12/04/2018 1022   GFRNONAA >60 02/20/2018 0850   GFRAA >60 12/04/2018 1022   GFRAA >60 02/20/2018 0850    No results found for: SPEP, UPEP  Lab Results  Component Value Date   WBC 7.7 12/04/2018   NEUTROABS 5.7 12/04/2018   HGB 11.7 (L) 12/04/2018   HCT 36.5 12/04/2018   MCV 94.1 12/04/2018   PLT 159 12/04/2018      Chemistry      Component Value Date/Time   NA 142 12/04/2018 1022   K 3.8 12/04/2018 1022   CL 105 12/04/2018 1022   CO2 27 12/04/2018 1022   BUN 8 12/04/2018 1022   CREATININE 0.75 12/04/2018 1022   CREATININE 0.85 02/20/2018 0850      Component Value Date/Time   CALCIUM 9.5 12/04/2018 1022   ALKPHOS 113 12/04/2018 1022   AST 26 12/04/2018 1022   AST 22 02/20/2018 0850   ALT 13 12/04/2018 1022   ALT 19 02/20/2018 0850   BILITOT 0.3 12/04/2018 1022   BILITOT 0.6 02/20/2018 0850       RADIOGRAPHIC STUDIES: I have reviewed imaging studies with the patient I have personally reviewed the radiological images as listed and agreed with the findings in the report. Ct Abdomen Pelvis W Contrast  Result Date: 12/04/2018 CLINICAL DATA:  Follow-up metastatic cervical  carcinoma. Currently undergoing chemotherapy. EXAM: CT ABDOMEN AND PELVIS WITH CONTRAST TECHNIQUE: Multidetector CT imaging of the abdomen and pelvis was performed using the standard protocol following bolus administration of intravenous contrast. CONTRAST:  180m OMNIPAQUE IOHEXOL 300 MG/ML  SOLN COMPARISON:  09/19/2018 FINDINGS: Lower Chest: No acute findings. Hepatobiliary: Interval decrease in size of liver metastases since previous study. Dominant metastasis in central right lobe currently measures 6.1 x 4.8 cm on image 17/2 compared to 8.3 x 7.6 cm previously. Probable  small benign hemangioma in segment 2 of the left lobe remains stable. No new or enlarging liver metastases identified. Pancreas:  No mass or inflammatory changes. Spleen: Within normal limits in size and appearance. Adrenals/Urinary Tract: No masses identified. No evidence of hydronephrosis. Stomach/Bowel: No evidence of obstruction, inflammatory process or abnormal fluid collections. Normal appendix visualized. Vascular/Lymphatic: Previously seen lymphadenopathy in the porta hepatis is no longer visualized. No lymphadenopathy seen elsewhere within the abdomen or pelvis. No abdominal aortic aneurysm. Reproductive: Multiple large calcified uterine fibroids are noted. No other pelvic mass identified. Tiny amount of free fluid noted in pelvic cul-de-sac. Other:  None. Musculoskeletal:  No suspicious bone lesions identified. IMPRESSION: 1. Interval decrease in size of liver metastases since previous study. 2. Resolution of porta hepatis lymphadenopathy since prior study. 3. No new or progressive metastatic disease. 4. Stable large calcified uterine fibroids. Electronically Signed   By: Earle Gell M.D.   On: 12/04/2018 13:49    All questions were answered. The patient knows to call the clinic with any problems, questions or concerns. No barriers to learning was detected.  I spent 25 minutes counseling the patient face to face. The total time  spent in the appointment was 30 minutes and more than 50% was on counseling and review of test results  Heath Lark, MD 12/05/2018 10:59 AM

## 2018-12-05 NOTE — Assessment & Plan Note (Signed)
The patient is reassured with excellent response to treatment She will continue alprazolam as needed She appears to be coping well with treatment

## 2018-12-05 NOTE — Assessment & Plan Note (Signed)
I have reviewed blood work and imaging studies with the patient She has excellent response to therapy Her liver enzymes are completely normal We will proceed with 3 more cycles of treatment before repeating another imaging study She agreed with the plan of care

## 2018-12-05 NOTE — Assessment & Plan Note (Signed)
She has remarkable response to therapy with normalization of liver enzymes and near complete response on imaging study We will continue treatment as directed

## 2018-12-05 NOTE — Assessment & Plan Note (Signed)
The patient understood the goals of treatment is palliative She is not able to work due to stage IV disease.

## 2018-12-05 NOTE — Patient Instructions (Signed)
[facility] Discharge Instructions for Patients Receiving Chemotherapy  Today you received the following chemotherapy agents Taxol and Carboplatin.   To help prevent nausea and vomiting after your treatment, we encourage you to take your nausea medication as prescribed.    If you develop nausea and vomiting that is not controlled by your nausea medication, call the clinic.   BELOW ARE SYMPTOMS THAT SHOULD BE REPORTED IMMEDIATELY:  *FEVER GREATER THAN 100.5 F  *CHILLS WITH OR WITHOUT FEVER  NAUSEA AND VOMITING THAT IS NOT CONTROLLED WITH YOUR NAUSEA MEDICATION  *UNUSUAL SHORTNESS OF BREATH  *UNUSUAL BRUISING OR BLEEDING  TENDERNESS IN MOUTH AND THROAT WITH OR WITHOUT PRESENCE OF ULCERS  *URINARY PROBLEMS  *BOWEL PROBLEMS  UNUSUAL RASH Items with * indicate a potential emergency and should be followed up as soon as possible.  Feel free to call the clinic should you have any questions or concerns. The clinic phone number is [phone].  Please show the CHEMO ALERT CARD at check-in to the Emergency Department and triage nurse.   

## 2018-12-08 ENCOUNTER — Telehealth: Payer: Self-pay | Admitting: Hematology and Oncology

## 2018-12-08 NOTE — Telephone Encounter (Signed)
Tried to reach regarding schedule °

## 2018-12-24 ENCOUNTER — Telehealth: Payer: Self-pay | Admitting: *Deleted

## 2018-12-24 NOTE — Telephone Encounter (Signed)
Patient called for a refill of the Morphine. She only has to take this once in a while. Usually Tylenol controls her pain but occasionally she needs something stronger. She will be here for her appt Friday to discuss but wanted to see if she could get this refill before hand.

## 2018-12-25 ENCOUNTER — Other Ambulatory Visit: Payer: Self-pay | Admitting: Hematology and Oncology

## 2018-12-25 MED ORDER — MORPHINE SULFATE 15 MG PO TABS: 15.0000 mg | ORAL_TABLET | Freq: Four times a day (QID) | ORAL | 0 refills | Status: DC | PRN

## 2018-12-25 NOTE — Telephone Encounter (Signed)
Refill sent.

## 2018-12-25 NOTE — Telephone Encounter (Signed)
Called and given below message. She verbalized understanding. 

## 2018-12-26 ENCOUNTER — Inpatient Hospital Stay: Payer: Medicaid Other

## 2018-12-26 ENCOUNTER — Other Ambulatory Visit: Payer: Self-pay

## 2018-12-26 ENCOUNTER — Inpatient Hospital Stay: Payer: Medicaid Other | Attending: Hematology and Oncology

## 2018-12-26 ENCOUNTER — Encounter: Payer: Self-pay | Admitting: Hematology and Oncology

## 2018-12-26 ENCOUNTER — Inpatient Hospital Stay (HOSPITAL_BASED_OUTPATIENT_CLINIC_OR_DEPARTMENT_OTHER): Payer: Medicaid Other | Admitting: Hematology and Oncology

## 2018-12-26 VITALS — BP 114/76 | HR 75 | Resp 18

## 2018-12-26 DIAGNOSIS — Z79899 Other long term (current) drug therapy: Secondary | ICD-10-CM | POA: Insufficient documentation

## 2018-12-26 DIAGNOSIS — T451X5A Adverse effect of antineoplastic and immunosuppressive drugs, initial encounter: Secondary | ICD-10-CM | POA: Insufficient documentation

## 2018-12-26 DIAGNOSIS — R5383 Other fatigue: Secondary | ICD-10-CM

## 2018-12-26 DIAGNOSIS — C539 Malignant neoplasm of cervix uteri, unspecified: Secondary | ICD-10-CM | POA: Diagnosis present

## 2018-12-26 DIAGNOSIS — D61818 Other pancytopenia: Secondary | ICD-10-CM | POA: Diagnosis not present

## 2018-12-26 DIAGNOSIS — Z5111 Encounter for antineoplastic chemotherapy: Secondary | ICD-10-CM | POA: Diagnosis present

## 2018-12-26 DIAGNOSIS — G893 Neoplasm related pain (acute) (chronic): Secondary | ICD-10-CM | POA: Insufficient documentation

## 2018-12-26 DIAGNOSIS — K5909 Other constipation: Secondary | ICD-10-CM

## 2018-12-26 DIAGNOSIS — C787 Secondary malignant neoplasm of liver and intrahepatic bile duct: Secondary | ICD-10-CM

## 2018-12-26 DIAGNOSIS — G620 Drug-induced polyneuropathy: Secondary | ICD-10-CM

## 2018-12-26 DIAGNOSIS — Z7189 Other specified counseling: Secondary | ICD-10-CM

## 2018-12-26 DIAGNOSIS — C530 Malignant neoplasm of endocervix: Secondary | ICD-10-CM

## 2018-12-26 DIAGNOSIS — D500 Iron deficiency anemia secondary to blood loss (chronic): Secondary | ICD-10-CM

## 2018-12-26 DIAGNOSIS — F411 Generalized anxiety disorder: Secondary | ICD-10-CM

## 2018-12-26 LAB — COMPREHENSIVE METABOLIC PANEL
ALT: 11 U/L (ref 0–44)
AST: 32 U/L (ref 15–41)
Albumin: 4.3 g/dL (ref 3.5–5.0)
Alkaline Phosphatase: 121 U/L (ref 38–126)
Anion gap: 13 (ref 5–15)
BUN: 13 mg/dL (ref 6–20)
CO2: 25 mmol/L (ref 22–32)
Calcium: 10.1 mg/dL (ref 8.9–10.3)
Chloride: 103 mmol/L (ref 98–111)
Creatinine, Ser: 0.75 mg/dL (ref 0.44–1.00)
GFR calc Af Amer: >60 mL/min (ref >60)
GFR calc non Af Amer: >60 mL/min (ref >60)
Glucose, Bld: 152 mg/dL — ABNORMAL HIGH (ref 70–99)
Potassium: 3.8 mmol/L (ref 3.5–5.1)
Sodium: 141 mmol/L (ref 135–145)
Total Bilirubin: 0.4 mg/dL (ref 0.3–1.2)
Total Protein: 8.1 g/dL (ref 6.5–8.1)

## 2018-12-26 LAB — CBC WITH DIFFERENTIAL/PLATELET
Abs Immature Granulocytes: 0.05 10*3/uL (ref 0.00–0.07)
Basophils Absolute: 0 10*3/uL (ref 0.0–0.1)
Basophils Relative: 0 %
Eosinophils Absolute: 0 10*3/uL (ref 0.0–0.5)
Eosinophils Relative: 0 %
HCT: 37.2 % (ref 36.0–46.0)
Hemoglobin: 12.1 g/dL (ref 12.0–15.0)
Immature Granulocytes: 1 %
Lymphocytes Relative: 7 %
Lymphs Abs: 0.6 10*3/uL — ABNORMAL LOW (ref 0.7–4.0)
MCH: 30.6 pg (ref 26.0–34.0)
MCHC: 32.5 g/dL (ref 30.0–36.0)
MCV: 94.2 fL (ref 80.0–100.0)
Monocytes Absolute: 0.2 10*3/uL (ref 0.1–1.0)
Monocytes Relative: 3 %
Neutro Abs: 7.7 10*3/uL (ref 1.7–7.7)
Neutrophils Relative %: 89 %
Platelets: 172 10*3/uL (ref 150–400)
RBC: 3.95 MIL/uL (ref 3.87–5.11)
RDW: 17.8 % — ABNORMAL HIGH (ref 11.5–15.5)
WBC: 8.5 10*3/uL (ref 4.0–10.5)
nRBC: 0 % (ref 0.0–0.2)

## 2018-12-26 LAB — TSH: TSH: 2.076 u[IU]/mL (ref 0.308–3.960)

## 2018-12-26 MED ORDER — SODIUM CHLORIDE 0.9 % IV SOLN
131.2500 mg/m2 | Freq: Once | INTRAVENOUS | Status: AC
  Administered 2018-12-26: 14:00:00 222 mg via INTRAVENOUS
  Filled 2018-12-26: qty 37

## 2018-12-26 MED ORDER — FAMOTIDINE IN NACL 20-0.9 MG/50ML-% IV SOLN
INTRAVENOUS | Status: AC
  Filled 2018-12-26: qty 50

## 2018-12-26 MED ORDER — SODIUM CHLORIDE 0.9% FLUSH
10.0000 mL | INTRAVENOUS | Status: DC | PRN
  Administered 2018-12-26: 10 mL
  Filled 2018-12-26: qty 10

## 2018-12-26 MED ORDER — HEPARIN SOD (PORK) LOCK FLUSH 100 UNIT/ML IV SOLN
500.0000 [IU] | Freq: Once | INTRAVENOUS | Status: AC | PRN
  Administered 2018-12-26: 500 [IU]
  Filled 2018-12-26: qty 5

## 2018-12-26 MED ORDER — SODIUM CHLORIDE 0.9 % IV SOLN
549.0000 mg | Freq: Once | INTRAVENOUS | Status: AC
  Administered 2018-12-26: 550 mg via INTRAVENOUS
  Filled 2018-12-26: qty 55

## 2018-12-26 MED ORDER — SODIUM CHLORIDE 0.9% FLUSH
10.0000 mL | Freq: Once | INTRAVENOUS | Status: AC
  Administered 2018-12-26: 10 mL
  Filled 2018-12-26: qty 10

## 2018-12-26 MED ORDER — SODIUM CHLORIDE 0.9 % IV SOLN
20.0000 mg | Freq: Once | INTRAVENOUS | Status: AC
  Administered 2018-12-26: 20 mg via INTRAVENOUS
  Filled 2018-12-26: qty 2

## 2018-12-26 MED ORDER — SODIUM CHLORIDE 0.9 % IV SOLN
Freq: Once | INTRAVENOUS | Status: AC
  Administered 2018-12-26: 12:00:00 via INTRAVENOUS
  Filled 2018-12-26: qty 250

## 2018-12-26 MED ORDER — FAMOTIDINE IN NACL 20-0.9 MG/50ML-% IV SOLN
20.0000 mg | Freq: Once | INTRAVENOUS | Status: AC
  Administered 2018-12-26: 20 mg via INTRAVENOUS

## 2018-12-26 MED ORDER — PALONOSETRON HCL INJECTION 0.25 MG/5ML
0.2500 mg | Freq: Once | INTRAVENOUS | Status: AC
  Administered 2018-12-26: 13:00:00 0.25 mg via INTRAVENOUS

## 2018-12-26 MED ORDER — PALONOSETRON HCL INJECTION 0.25 MG/5ML
INTRAVENOUS | Status: AC
  Filled 2018-12-26: qty 5

## 2018-12-26 NOTE — Assessment & Plan Note (Signed)
She has remarkable response to therapy with normalization of liver enzymes and near complete response on imaging study We will continue treatment as directed

## 2018-12-26 NOTE — Assessment & Plan Note (Signed)
She had recent mild pain in her abdomen and pain from neuropathy I gave her prescription refill of morphine to take as needed

## 2018-12-26 NOTE — Assessment & Plan Note (Signed)
She started to experience problems with neuropathy I recommend reducing the dose of Taxol We will proceed with cycle 5 Plan to repeat imaging study again in a few months

## 2018-12-26 NOTE — Assessment & Plan Note (Signed)
she has mild peripheral neuropathy, likely related to side effects of treatment. I plan to reduce the dose of treatment as outlined above.  I explained to the patient the rationale of this strategy and reassured the patient it would not compromise the efficacy of treatment She will continue to take pain medicine as needed I do not believe she needs gabapentin right now

## 2018-12-26 NOTE — Patient Instructions (Signed)
[facility] Discharge Instructions for Patients Receiving Chemotherapy  Today you received the following chemotherapy agents Taxol and Carboplatin.   To help prevent nausea and vomiting after your treatment, we encourage you to take your nausea medication as prescribed.    If you develop nausea and vomiting that is not controlled by your nausea medication, call the clinic.   BELOW ARE SYMPTOMS THAT SHOULD BE REPORTED IMMEDIATELY:  *FEVER GREATER THAN 100.5 F  *CHILLS WITH OR WITHOUT FEVER  NAUSEA AND VOMITING THAT IS NOT CONTROLLED WITH YOUR NAUSEA MEDICATION  *UNUSUAL SHORTNESS OF BREATH  *UNUSUAL BRUISING OR BLEEDING  TENDERNESS IN MOUTH AND THROAT WITH OR WITHOUT PRESENCE OF ULCERS  *URINARY PROBLEMS  *BOWEL PROBLEMS  UNUSUAL RASH Items with * indicate a potential emergency and should be followed up as soon as possible.  Feel free to call the clinic should you have any questions or concerns. The clinic phone number is [phone].  Please show the CHEMO ALERT CARD at check-in to the Emergency Department and triage nurse.   

## 2018-12-26 NOTE — Progress Notes (Signed)
[place] OFFICE PROGRESS NOTE  Patient Care Team: Avel Sensor, MD as PCP - General (Obstetrics and Gynecology)  ASSESSMENT & PLAN:  Cervical cancer [facility]) She started to experience problems with neuropathy I recommend reducing the dose of Taxol We will proceed with cycle 5 Plan to repeat imaging study again in a few months  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[facility]) She has remarkable response to therapy with normalization of liver enzymes and near complete response on imaging study We will continue treatment as directed  Peripheral neuropathy due to chemotherapy [facility]) she has mild peripheral neuropathy, likely related to side effects of treatment. I plan to reduce the dose of treatment as outlined above.  I explained to the patient the rationale of this strategy and reassured the patient it would not compromise the efficacy of treatment She will continue to take pain medicine as needed I do not believe she needs gabapentin right now  Cancer associated pain She had recent mild pain in her abdomen and pain from neuropathy I gave her prescription refill of morphine to take as needed   No orders of the defined types were placed in this encounter.   INTERVAL HISTORY: Please see below for problem oriented charting. She returns for further follow-up She complained of mild intermittent right upper quadrant pain She also have peripheral neuropathy in her hands and feet but not severe Appetite is stable Denies recent infection, fever or chills Denies recent nausea or constipation  SUMMARY OF ONCOLOGIC HISTORY: Oncology History   She tested positive for PD-L1 25% from liver biopsy  Progressed bevacizumab and keytruda     Cervical cancer ([place])   07/22/2017 Initial Diagnosis    The patient was examined in the wellness center.  Upon vaginal exam, friable mass of tissue presumably from the cervix was interpreted as poorly differentiated high-grade malignant neoplasm.   Follow-up workup with ultrasound of the uterus revealed multiple large uterine fibroids.    07/22/2017 Pathology Results    Biopsy confirmed poorly differentiated high-grade malignant neoplasm.    08/06/2017 Imaging    6.6 cm uterine mass in area of cervix and upper vagina, highly suspicious for cervical carcinoma.  Mild bilateral iliac lymphadenopathy, consistent with metastatic disease. No abdominal lymphadenopathy identified.  Multiple large liver masses, with areas of central necrosis or scarring, highly suspicious for liver metastases. Although unlikely, it is conceivable that some of these lesions could represent other etiology such as focal nodular hyperplasia or adenomas. Consider further evaluation with PET-CT, abdomen MRI without and with contrast, or percutaneous needle biopsy.  Multiple large uterine fibroids, many showing cystic degeneration.  Bilateral cystic adnexal lesions with probably benign features. Differential diagnosis includes ovarian cysts, endometriomas, and hydrosalpinges. These could also be further assessed on PET-CT or MRI.  No evidence of thoracic metastatic disease.     08/09/2017 Tumor Marker    Patient's tumor was tested for the following markers: CA-125 Results of the tumor marker test revealed 79.2    08/14/2017 Pathology Results    Liver, needle/core biopsy, left hepatic lobe - METASTATIC POORLY DIFFERENTIATED CARCINOMA, SEE COMMENT. Microscopic Comment The tumor is poorly differentiated with abundant necrosis. Pancytokeratin, PAX-8, cytokeratin 7, and p16 are positive. There is weak non-specific staining with TTF-1, synaptophysin (very focal), p63 (very focal). Chromogranin, CD56, S100, desmin, SMA, ER, CD10, cytokeratin 10, cytokeratin 5/6, and cytokeratin 20 are negative. Thus, the immunoprofile is suggest of a gynecologic primary. Dr. Lyndon Code has reviewed the case.     08/21/2017 Procedure  Ultrasound and fluoroscopically guided right internal  jugular single lumen power port catheter insertion. Tip in the SVC/RA junction. Catheter ready for use.    08/30/2017 - 01/03/2018 Chemotherapy    The patient had received cisplatin and Taxol x 6 cycles    09/12/2017 - 06/26/2018 Chemotherapy    The patient had bevacizumab for chemotherapy treatment.     10/24/2017 Imaging    Interval resolution of large cervical mass and bilateral iliac lymphadenopathy since previous study.  Significant decrease in size of diffuse liver metastases since prior study. No new or progressive metastatic disease identified.  Stable probably benign cystic lesion in right adnexa. Previously seen left adnexal cystic lesion has resolved since prior study.  Stable large uterine fibroids. Increased mild right hydronephrosis due to extrinsic compression of ureter by enlarged uterus.    02/20/2018 Imaging    Interval improvement in diffuse liver metastases since prior study. No new or progressive metastatic disease.  Stable large uterine fibroids.  No significant change in tubular cystic lesion in right adnexa, suspicious for hydrosalpinx or endometrioma.    06/05/2018 Imaging    CT AP W Contrast 06/05/18 IMPRESSION: New mild lymphadenopathy in left paraaortic region and porta hepatis, and slight increase in size of right external iliac lymph node, consistent with metastatic disease.  Increased soft tissue prominence in region of cervix and right vaginal cuff, consistent with local tumor progression.  Multiple liver metastases, with mild decrease in size of several metastatic lesions.  Stable uterine fibroids.    06/27/2018 - 08/29/2018 Chemotherapy    The patient had Keytruda    09/19/2018 Imaging    1. Increased size of large masses in the cervix and lower uterine segment, consistent with local recurrence of cervical carcinoma. This results in new moderate hydrometros. 2. Increased abdominal and pelvic lymphadenopathy, consistent with metastatic disease. 3.  Markedly increased size of a central right hepatic lobe metastasis. Other smaller liver metastases show no significant change. 4. Stable large uterine fibroids.     10/03/2018 -  Chemotherapy    The patient had carboplatin and taxol     12/04/2018 Imaging    1. Interval decrease in size of liver metastases since previous study. 2. Resolution of porta hepatis lymphadenopathy since prior study. 3. No new or progressive metastatic disease. 4. Stable large calcified uterine fibroids.      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([place])   08/09/2017 Initial Diagnosis    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[facility])    09/12/2017 - 06/26/2018 Chemotherapy    The patient had bevacizumab (AVASTIN) 800 mg in sodium chloride 0.9 % 100 mL chemo infusion, 15 mg/kg = 800 mg, Intravenous,  Once, 10 of 10 cycles Dose modification: 15 mg/kg (original dose 15 mg/kg, Cycle 8, Reason: Provider Judgment), 15 mg/kg (original dose 15 mg/kg, Cycle 9, Reason: Other (see comments)) Administration: 800 mg (09/20/2017), 800 mg (11/01/2017), 800 mg (11/22/2017), 800 mg (12/13/2017), 800 mg (02/21/2018), 800 mg (04/04/2018), 1,000 mg (04/25/2018), 1,000 mg (05/16/2018), 1,000 mg (06/06/2018)  for chemotherapy treatment.     06/27/2018 - 09/21/2018 Chemotherapy    The patient had pembrolizumab (KEYTRUDA) 200 mg in sodium chloride 0.9 % 50 mL chemo infusion, 200 mg, Intravenous, Once, 4 of 5 cycles Administration: 200 mg (06/27/2018), 200 mg (07/18/2018), 200 mg (08/08/2018), 200 mg (08/29/2018)  for chemotherapy treatment.     10/03/2018 -  Chemotherapy    The patient had palonosetron (ALOXI) injection 0.25 mg, 0.25 mg, Intravenous,  Once, 5 of 7 cycles Administration: 0.25 mg (  10/03/2018), 0.25 mg (10/23/2018), 0.25 mg (11/13/2018), 0.25 mg (12/05/2018) CARBOplatin (PARAPLATIN) 540 mg in sodium chloride 0.9 % 250 mL chemo infusion, 540 mg (98.1 % of original dose 549 mg), Intravenous,  Once, 5 of 7 cycles Dose modification:   (original dose 549 mg, Cycle  1) Administration: 540 mg (10/03/2018), 540 mg (10/23/2018), 540 mg (11/13/2018), 540 mg (12/05/2018) PACLitaxel (TAXOL) 294 mg in sodium chloride 0.9 % 250 mL chemo infusion (> 1m/m2), 175 mg/m2 = 294 mg, Intravenous,  Once, 5 of 7 cycles Dose modification: 131.25 mg/m2 (75 % of original dose 175 mg/m2, Cycle 5, Reason: Dose Not Tolerated) Administration: 294 mg (10/03/2018), 294 mg (10/23/2018), 294 mg (11/13/2018), 294 mg (12/05/2018)  for chemotherapy treatment.      REVIEW OF SYSTEMS:   Constitutional: Denies fevers, chills or abnormal weight loss Eyes: Denies blurriness of vision Ears, nose, mouth, throat, and face: Denies mucositis or sore throat Respiratory: Denies cough, dyspnea or wheezes Cardiovascular: Denies palpitation, chest discomfort or lower extremity swelling Gastrointestinal:  Denies nausea, heartburn or change in bowel habits Skin: Denies abnormal skin rashes Lymphatics: Denies new lymphadenopathy or easy bruising Behavioral/Psych: Mood is stable, no new changes  All other systems were reviewed with the patient and are negative.  I have reviewed the past medical history, past surgical history, social history and family history with the patient and they are unchanged from previous note.  ALLERGIES:  is allergic to benadryl [diphenhydramine].  MEDICATIONS:  Current Outpatient Medications  Medication Sig Dispense Refill  . acetaminophen (TYLENOL) 325 MG tablet Take 650 mg by mouth every 6 (six) hours as needed.    . ALPRAZolam (XANAX) 0.5 MG tablet Take 1 tablet (0.5 mg total) by mouth at bedtime as needed for anxiety. 60 tablet 0  . amLODipine (NORVASC) 5 MG tablet Take 1 tablet (5 mg total) by mouth daily. TAKE 1 TABLET(10 MG) BY MOUTH DAILY    . dexamethasone (DECADRON) 4 MG tablet Take 2 tabs at the night before and 2 tab the morning of chemotherapy, every 3 weeks, by mouth 12 tablet 0  . levothyroxine (SYNTHROID, LEVOTHROID) 25 MCG tablet Take 1 tablet (25 mcg total) by  mouth daily before breakfast. 30 tablet 1  . lidocaine-prilocaine (EMLA) cream Apply to affected area once 30 g 3  . magnesium oxide (MAG-OX) 400 (241.3 Mg) MG tablet Take 1 tablet (400 mg total) by mouth 2 (two) times daily. 60 tablet 6  . morphine (MSIR) 15 MG tablet Take 1 tablet (15 mg total) by mouth every 6 (six) hours as needed for severe pain. 30 tablet 0  . ondansetron (ZOFRAN) 8 MG tablet Take 1 tablet (8 mg total) by mouth every 8 (eight) hours as needed for nausea. 30 tablet 3  . prochlorperazine (COMPAZINE) 10 MG tablet Take 1 tablet (10 mg total) by mouth every 6 (six) hours as needed for nausea or vomiting. 30 tablet 0  . sertraline (ZOLOFT) 25 MG tablet Take 1 tablet (25 mg total) by mouth daily. 30 tablet 9   No current facility-administered medications for this visit.    Facility-Administered Medications Ordered in Other Visits  Medication Dose Route Frequency Provider Last Rate Last Dose  . CARBOplatin (PARAPLATIN) 550 mg in sodium chloride 0.9 % 250 mL chemo infusion  550 mg Intravenous Once Marvelous Bouwens, MD      . dexamethasone (DECADRON) 20 mg in sodium chloride 0.9 % 50 mL IVPB  20 mg Intravenous Once GHeath Lark MD      .  famotidine (PEPCID) IVPB 20 mg premix  20 mg Intravenous Once Alvy Bimler, Megan Hayduk, MD      . heparin lock flush 100 unit/mL  500 Units Intracatheter Once PRN Alvy Bimler, Rahm Minix, MD      . PACLitaxel (TAXOL) 222 mg in sodium chloride 0.9 % 250 mL chemo infusion (> 49m/m2)  131.25 mg/m2 (Treatment Plan Recorded) Intravenous Once GAlvy Bimler Eamon Tantillo, MD      . palonosetron (ALOXI) injection 0.25 mg  0.25 mg Intravenous Once Shuronda Santino, MD      . sodium chloride flush (NS) 0.9 % injection 10 mL  10 mL Intracatheter PRN GAlvy Bimler Elaysha Bevard, MD        PHYSICAL EXAMINATION: ECOG PERFORMANCE STATUS: 1 - Symptomatic but completely ambulatory  Vitals:   12/26/18 1056  BP: (!) 138/94  Pulse: (!) 127  Resp: 20  Temp: 97.9 F (36.6 C)  SpO2: 100%   Filed Weights   12/26/18 1056   Weight: 132 lb 9.6 oz (60.1 kg)    GENERAL:alert, no distress and comfortable SKIN: skin color, texture, turgor are normal, no rashes or significant lesions EYES: normal, Conjunctiva are pink and non-injected, sclera clear OROPHARYNX:no exudate, no erythema and lips, buccal mucosa, and tongue normal  NECK: supple, thyroid normal size, non-tender, without nodularity LYMPH:  no palpable lymphadenopathy in the cervical, axillary or inguinal LUNGS: clear to auscultation and percussion with normal breathing effort HEART: regular rate & rhythm and no murmurs and no lower extremity edema ABDOMEN:abdomen soft, non-tender and normal bowel sounds Musculoskeletal:no cyanosis of digits and no clubbing  NEURO: alert & oriented x 3 with fluent speech, no focal motor/sensory deficits  LABORATORY DATA:  I have reviewed the data as listed    Component Value Date/Time   NA 141 12/26/2018 1047   K 3.8 12/26/2018 1047   CL 103 12/26/2018 1047   CO2 25 12/26/2018 1047   GLUCOSE 152 (H) 12/26/2018 1047   BUN 13 12/26/2018 1047   CREATININE 0.75 12/26/2018 1047   CREATININE 0.85 02/20/2018 0850   CALCIUM 10.1 12/26/2018 1047   PROT 8.1 12/26/2018 1047   ALBUMIN 4.3 12/26/2018 1047   AST 32 12/26/2018 1047   AST 22 02/20/2018 0850   ALT 11 12/26/2018 1047   ALT 19 02/20/2018 0850   ALKPHOS 121 12/26/2018 1047   BILITOT 0.4 12/26/2018 1047   BILITOT 0.6 02/20/2018 0850   GFRNONAA >60 12/26/2018 1047   GFRNONAA >60 02/20/2018 0850   GFRAA >60 12/26/2018 1047   GFRAA >60 02/20/2018 0850    No results found for: SPEP, UPEP  Lab Results  Component Value Date   WBC 8.5 12/26/2018   NEUTROABS 7.7 12/26/2018   HGB 12.1 12/26/2018   HCT 37.2 12/26/2018   MCV 94.2 12/26/2018   PLT 172 12/26/2018      Chemistry      Component Value Date/Time   NA 141 12/26/2018 1047   K 3.8 12/26/2018 1047   CL 103 12/26/2018 1047   CO2 25 12/26/2018 1047   BUN 13 12/26/2018 1047   CREATININE 0.75  12/26/2018 1047   CREATININE 0.85 02/20/2018 0850      Component Value Date/Time   CALCIUM 10.1 12/26/2018 1047   ALKPHOS 121 12/26/2018 1047   AST 32 12/26/2018 1047   AST 22 02/20/2018 0850   ALT 11 12/26/2018 1047   ALT 19 02/20/2018 0850   BILITOT 0.4 12/26/2018 1047   BILITOT 0.6 02/20/2018 0850       RADIOGRAPHIC STUDIES: I have personally  reviewed the radiological images as listed and agreed with the findings in the report. Ct Abdomen Pelvis W Contrast  Result Date: 12/04/2018 CLINICAL DATA:  Follow-up metastatic cervical carcinoma. Currently undergoing chemotherapy. EXAM: CT ABDOMEN AND PELVIS WITH CONTRAST TECHNIQUE: Multidetector CT imaging of the abdomen and pelvis was performed using the standard protocol following bolus administration of intravenous contrast. CONTRAST:  126m OMNIPAQUE IOHEXOL 300 MG/ML  SOLN COMPARISON:  09/19/2018 FINDINGS: Lower Chest: No acute findings. Hepatobiliary: Interval decrease in size of liver metastases since previous study. Dominant metastasis in central right lobe currently measures 6.1 x 4.8 cm on image 17/2 compared to 8.3 x 7.6 cm previously. Probable small benign hemangioma in segment 2 of the left lobe remains stable. No new or enlarging liver metastases identified. Pancreas:  No mass or inflammatory changes. Spleen: Within normal limits in size and appearance. Adrenals/Urinary Tract: No masses identified. No evidence of hydronephrosis. Stomach/Bowel: No evidence of obstruction, inflammatory process or abnormal fluid collections. Normal appendix visualized. Vascular/Lymphatic: Previously seen lymphadenopathy in the porta hepatis is no longer visualized. No lymphadenopathy seen elsewhere within the abdomen or pelvis. No abdominal aortic aneurysm. Reproductive: Multiple large calcified uterine fibroids are noted. No other pelvic mass identified. Tiny amount of free fluid noted in pelvic cul-de-sac. Other:  None. Musculoskeletal:  No suspicious bone  lesions identified. IMPRESSION: 1. Interval decrease in size of liver metastases since previous study. 2. Resolution of porta hepatis lymphadenopathy since prior study. 3. No new or progressive metastatic disease. 4. Stable large calcified uterine fibroids. Electronically Signed   By: JEarle GellM.D.   On: 12/04/2018 13:49    All questions were answered. The patient knows to call the clinic with any problems, questions or concerns. No barriers to learning was detected.  I spent 25 minutes counseling the patient face to face. The total time spent in the appointment was 40 minutes and more than 50% was on counseling and review of test results  NHeath Lark MD 12/26/2018 12:35 PM

## 2018-12-26 NOTE — Patient Instructions (Signed)

## 2019-01-11 IMAGING — CT CT ABD-PELV W/ CM
2 of 5 series · 13 of 36 positions shown, 16 images · IV contrast (iopamidol)
Comparison: None.

CLINICAL DATA: Newly diagnosed high-grade uterine carcinoma.
Staging.

EXAM:
CT CHEST, ABDOMEN, AND PELVIS WITH CONTRAST
TECHNIQUE: Multidetector CT imaging of the chest, abdomen and pelvis was
performed following the standard protocol during bolus
administration of intravenous contrast.
CONTRAST:  100mL 2RCAXC-B99 IOPAMIDOL (2RCAXC-B99) INJECTION 61%

[Series 2: cap with · axial · 0.68mm/px · z∈[+1154,+1654]mm · 10 of 124 slices shown, 13 images]
[im 12/124  mediastinal]
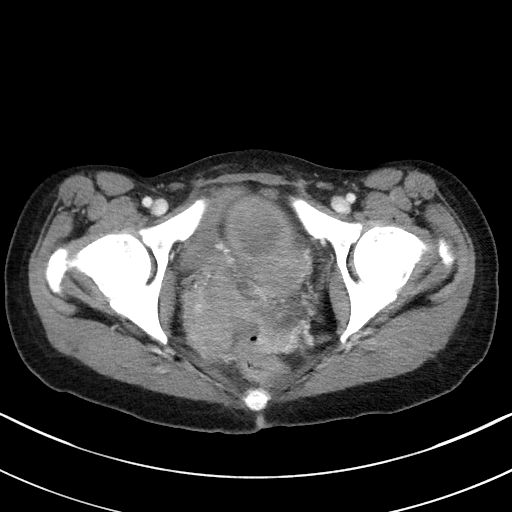
[im 12/124  lung]
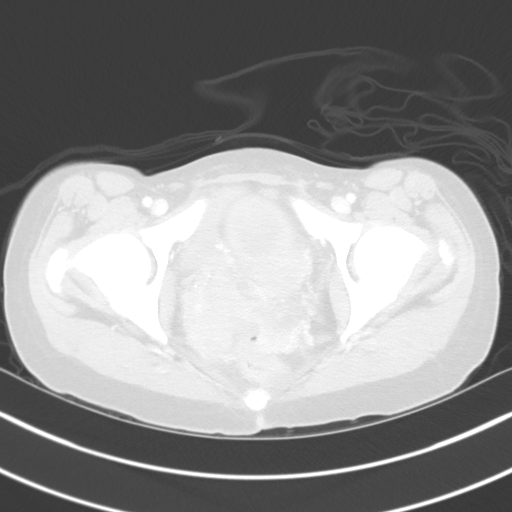
[im 23/124  lung]
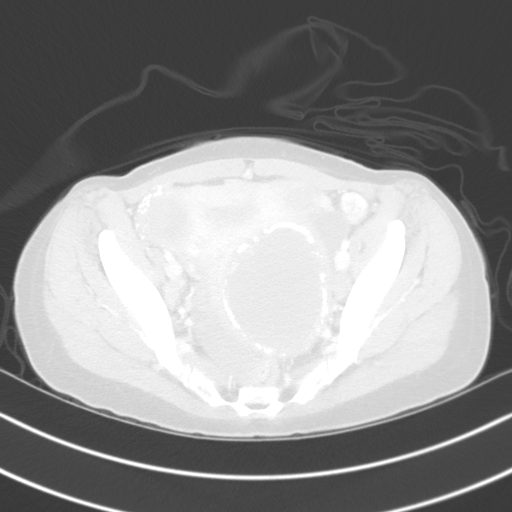
[im 34/124  lung]
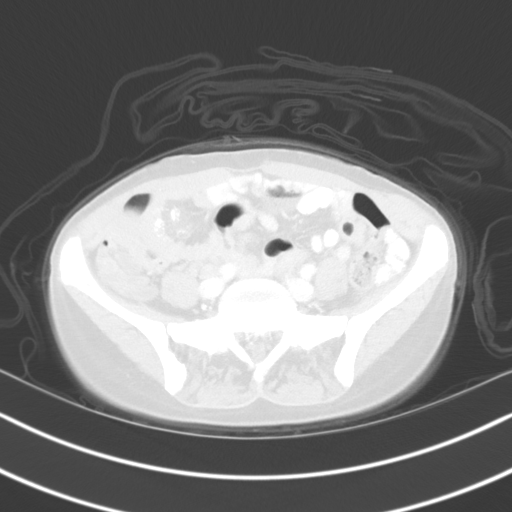
[im 45/124  lung]
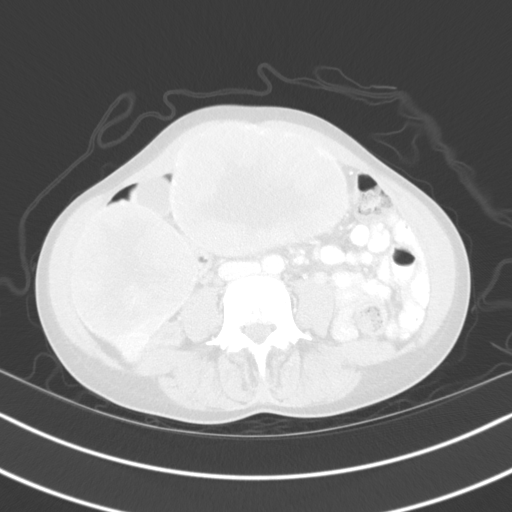
[im 56/124  mediastinal]
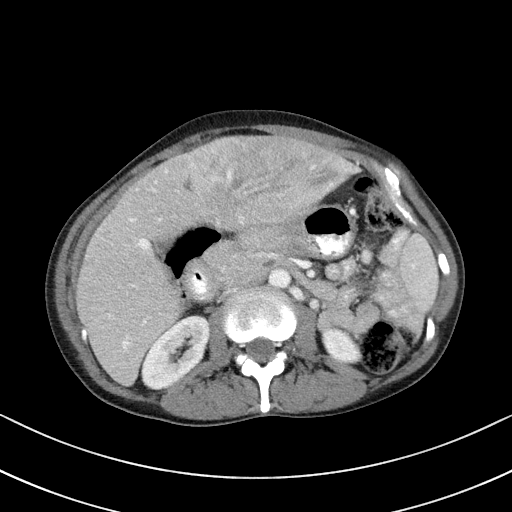
[im 56/124  lung]
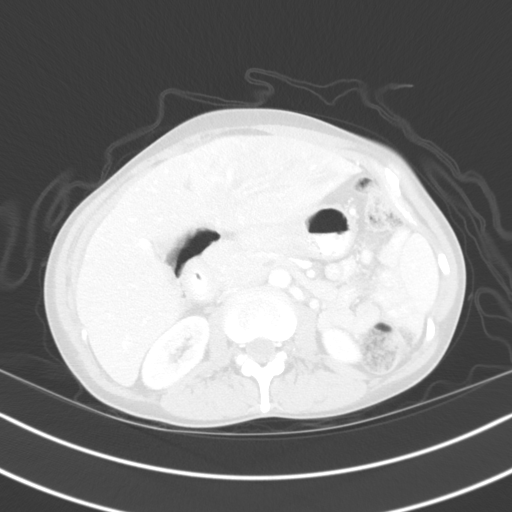
[im 68/124  lung]
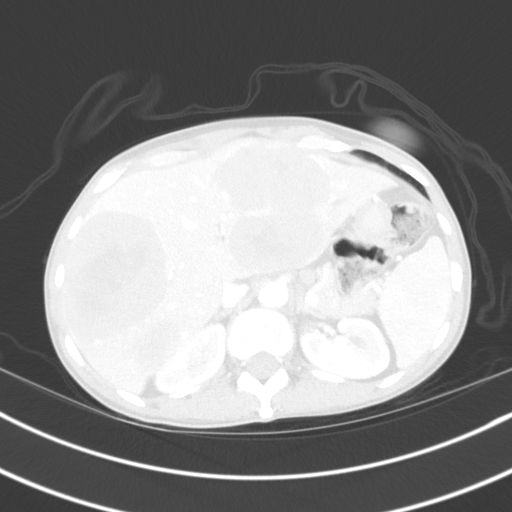
[im 79/124  lung]
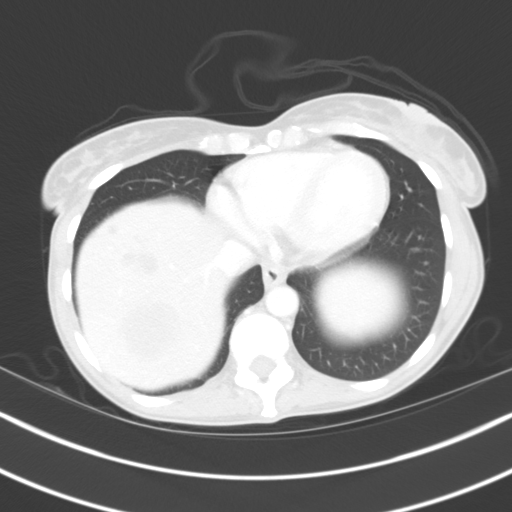
[im 90/124  lung]
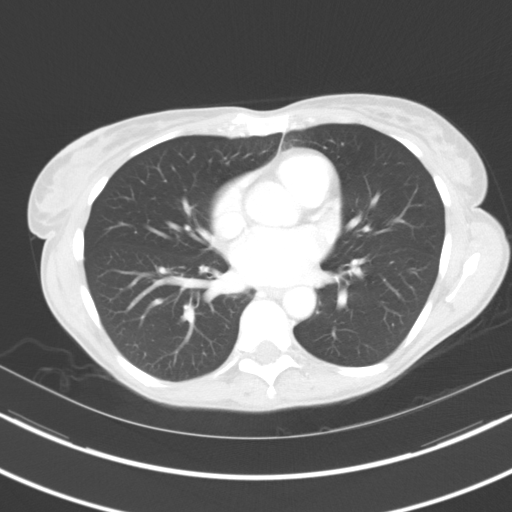
[im 101/124  mediastinal]
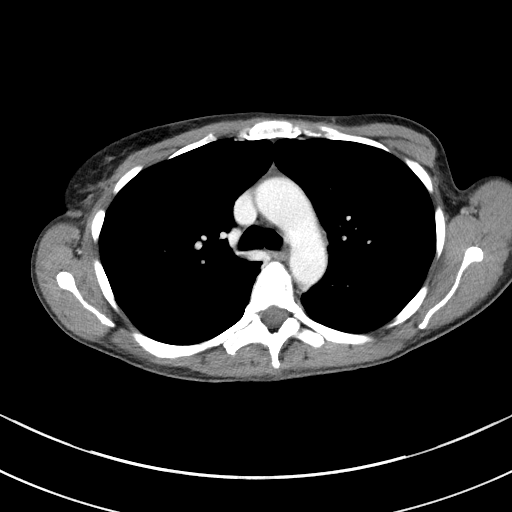
[im 101/124  lung]
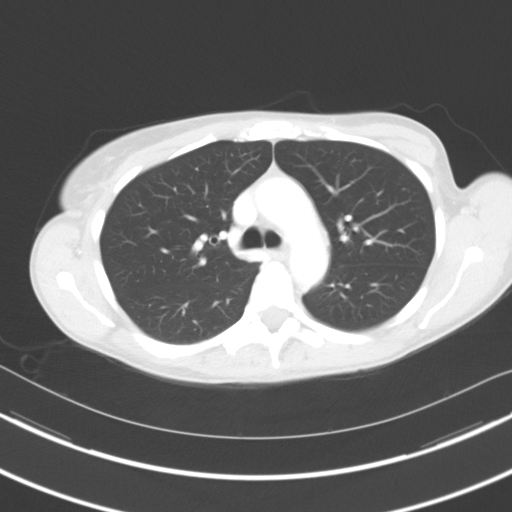
[im 112/124  lung]
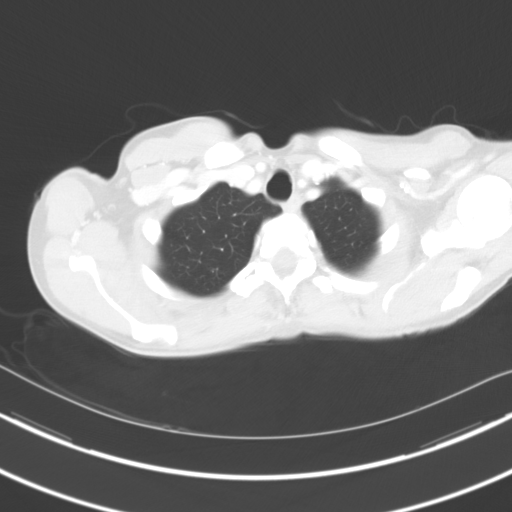

[Series 4: coronals · coronal · 0.85mm/px · 3 of 117 slices shown]
[im 24/117  lung]
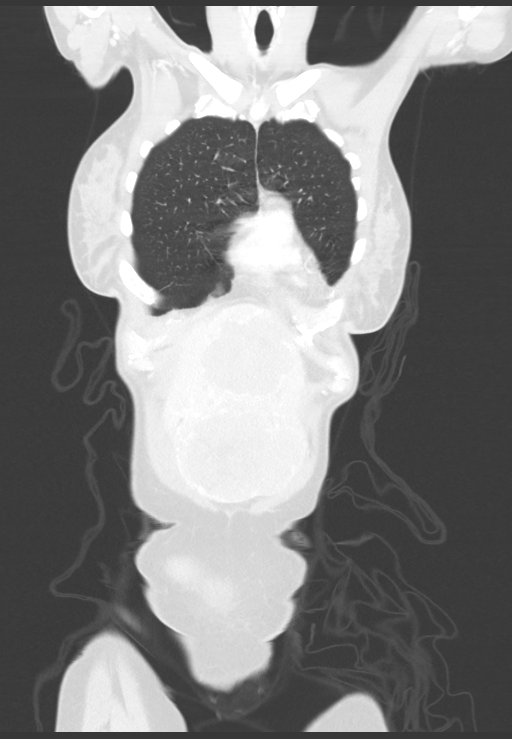
[im 47/117  lung]
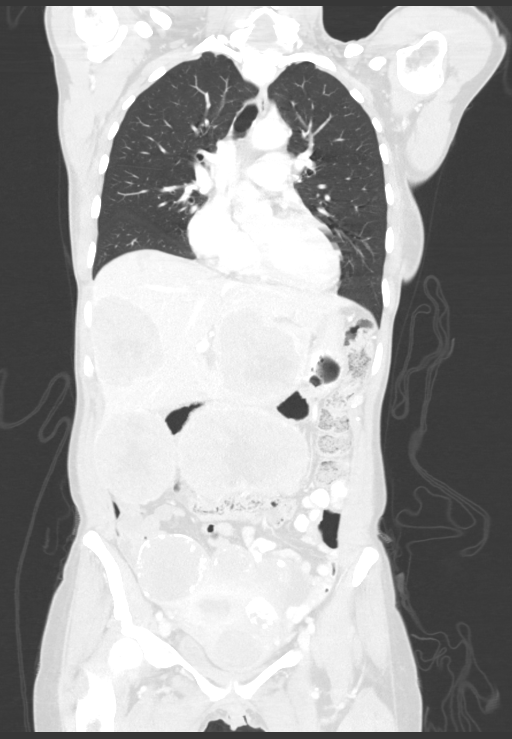
[im 70/117  lung]
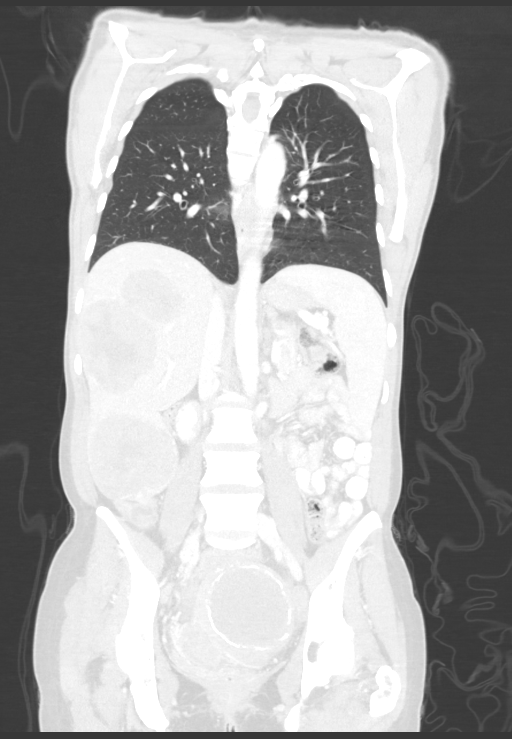

[13 of 36 positions shown; findings below may reference images not displayed]

FINDINGS: CT CHEST FINDINGS

Cardiovascular: No acute findings.

Mediastinum/Lymph Nodes: No masses or pathologically enlarged lymph
nodes identified.

Lungs/Pleura: No pulmonary infiltrate or mass identified. No
effusion present.

Musculoskeletal:  No suspicious bone lesions identified.

CT ABDOMEN AND PELVIS FINDINGS

Hepatobiliary: Multiple large liver masses are seen in both right
and left lobes. Largest mass in left hepatic lobe measures 12.2 x
9.6 cm on image 78/2. Largest mass in right lobe measures 9.3 x
cm on image 60/2. Majority of these larger masses are very similar
in appearance, with central areas of necrosis or scarring.

Gallbladder is unremarkable. No evidence of biliary ductal
dilatation.

Pancreas:  No mass or inflammatory changes.

Spleen:  Within normal limits in size and appearance.

Adrenals/Urinary tract:  No masses or hydronephrosis.

Stomach/Bowel: No evidence of obstruction, inflammatory process, or
abnormal fluid collections.

Vascular/Lymphatic: Bilateral external iliac lymphadenopathy is
seen, measuring 1.6 cm on the right (image 104/2), and 1.4 cm on the
left (image 102/2). Mild lymphadenopathy in the right common iliac
chain measures 1.2 cm on image 90/2. No abdominal aortic aneurysm.

Reproductive: Multiple uterine fibroids are seen, several of which
show cystic degeneration and peripheral calcification. Largest in
the left lower uterine segment measures 9.7 cm. A heterogeneously
enhancing mass is seen area of the cervix and upper vagina which
measures 6.6 x 5.1 cm on image 111/2. This is highly suspicious for
cervical carcinoma.

Ovoid cystic lesions are seen in both adnexal regions, measuring
cm on left and 5.5 cm on the right. Although higher than simple
fluid attenuation, these lesions show no evidence of enhancing
septations or soft tissue nodularity.

Other:  None.

Musculoskeletal:  No suspicious bone lesions identified.
IMPRESSION: 6.6 cm uterine mass in area of cervix and upper vagina, highly
suspicious for cervical carcinoma.

Mild bilateral iliac lymphadenopathy, consistent with metastatic
disease. No abdominal lymphadenopathy identified.

Multiple large liver masses, with areas of central necrosis or
scarring, highly suspicious for liver metastases. Although unlikely,
it is conceivable that some of these lesions could represent other
etiology such as focal nodular hyperplasia or adenomas. Consider
further evaluation with PET-CT, abdomen MRI without and with
contrast, or percutaneous needle biopsy.

Multiple large uterine fibroids, many showing cystic degeneration.

Bilateral cystic adnexal lesions with probably benign features.
Differential diagnosis includes ovarian cysts, endometriomas, and
hydrosalpinges. These could also be further assessed on PET-CT or
MRI.

No evidence of thoracic metastatic disease.

## 2019-01-15 ENCOUNTER — Inpatient Hospital Stay (HOSPITAL_BASED_OUTPATIENT_CLINIC_OR_DEPARTMENT_OTHER): Payer: Medicaid Other | Admitting: Hematology and Oncology

## 2019-01-15 ENCOUNTER — Inpatient Hospital Stay: Payer: Medicaid Other

## 2019-01-15 ENCOUNTER — Encounter: Payer: Self-pay | Admitting: Hematology and Oncology

## 2019-01-15 ENCOUNTER — Other Ambulatory Visit: Payer: Self-pay

## 2019-01-15 VITALS — HR 80

## 2019-01-15 DIAGNOSIS — F411 Generalized anxiety disorder: Secondary | ICD-10-CM | POA: Diagnosis not present

## 2019-01-15 DIAGNOSIS — Z7189 Other specified counseling: Secondary | ICD-10-CM

## 2019-01-15 DIAGNOSIS — D61818 Other pancytopenia: Secondary | ICD-10-CM | POA: Insufficient documentation

## 2019-01-15 DIAGNOSIS — K5909 Other constipation: Secondary | ICD-10-CM

## 2019-01-15 DIAGNOSIS — C787 Secondary malignant neoplasm of liver and intrahepatic bile duct: Secondary | ICD-10-CM

## 2019-01-15 DIAGNOSIS — D500 Iron deficiency anemia secondary to blood loss (chronic): Secondary | ICD-10-CM

## 2019-01-15 DIAGNOSIS — C530 Malignant neoplasm of endocervix: Secondary | ICD-10-CM

## 2019-01-15 DIAGNOSIS — C539 Malignant neoplasm of cervix uteri, unspecified: Secondary | ICD-10-CM

## 2019-01-15 DIAGNOSIS — G893 Neoplasm related pain (acute) (chronic): Secondary | ICD-10-CM

## 2019-01-15 DIAGNOSIS — Z79899 Other long term (current) drug therapy: Secondary | ICD-10-CM

## 2019-01-15 DIAGNOSIS — R5383 Other fatigue: Secondary | ICD-10-CM

## 2019-01-15 DIAGNOSIS — Z5111 Encounter for antineoplastic chemotherapy: Secondary | ICD-10-CM | POA: Diagnosis not present

## 2019-01-15 LAB — COMPREHENSIVE METABOLIC PANEL
ALT: 14 U/L (ref 0–44)
AST: 33 U/L (ref 15–41)
Albumin: 4.3 g/dL (ref 3.5–5.0)
Alkaline Phosphatase: 109 U/L (ref 38–126)
Anion gap: 13 (ref 5–15)
BUN: 14 mg/dL (ref 6–20)
CO2: 24 mmol/L (ref 22–32)
Calcium: 10.2 mg/dL (ref 8.9–10.3)
Chloride: 102 mmol/L (ref 98–111)
Creatinine, Ser: 0.73 mg/dL (ref 0.44–1.00)
GFR calc Af Amer: >60 mL/min (ref >60)
GFR calc non Af Amer: >60 mL/min (ref >60)
Glucose, Bld: 147 mg/dL — ABNORMAL HIGH (ref 70–99)
Potassium: 3.9 mmol/L (ref 3.5–5.1)
Sodium: 139 mmol/L (ref 135–145)
Total Bilirubin: 0.4 mg/dL (ref 0.3–1.2)
Total Protein: 8.1 g/dL (ref 6.5–8.1)

## 2019-01-15 LAB — CBC WITH DIFFERENTIAL/PLATELET
Abs Immature Granulocytes: 0.01 10*3/uL (ref 0.00–0.07)
Basophils Absolute: 0 10*3/uL (ref 0.0–0.1)
Basophils Relative: 0 %
Eosinophils Absolute: 0 10*3/uL (ref 0.0–0.5)
Eosinophils Relative: 0 %
HCT: 35.6 % — ABNORMAL LOW (ref 36.0–46.0)
Hemoglobin: 11.6 g/dL — ABNORMAL LOW (ref 12.0–15.0)
Immature Granulocytes: 0 %
Lymphocytes Relative: 11 %
Lymphs Abs: 0.5 10*3/uL — ABNORMAL LOW (ref 0.7–4.0)
MCH: 32 pg (ref 26.0–34.0)
MCHC: 32.6 g/dL (ref 30.0–36.0)
MCV: 98.1 fL (ref 80.0–100.0)
Monocytes Absolute: 0.1 10*3/uL (ref 0.1–1.0)
Monocytes Relative: 3 %
Neutro Abs: 4.1 10*3/uL (ref 1.7–7.7)
Neutrophils Relative %: 86 %
Platelets: 129 10*3/uL — ABNORMAL LOW (ref 150–400)
RBC: 3.63 MIL/uL — ABNORMAL LOW (ref 3.87–5.11)
RDW: 17 % — ABNORMAL HIGH (ref 11.5–15.5)
WBC: 4.7 10*3/uL (ref 4.0–10.5)
nRBC: 0 % (ref 0.0–0.2)

## 2019-01-15 LAB — TSH: TSH: 1.213 u[IU]/mL (ref 0.308–3.960)

## 2019-01-15 MED ORDER — SODIUM CHLORIDE 0.9 % IV SOLN
20.0000 mg | Freq: Once | INTRAVENOUS | Status: AC
  Administered 2019-01-15: 20 mg via INTRAVENOUS
  Filled 2019-01-15: qty 2

## 2019-01-15 MED ORDER — PALONOSETRON HCL INJECTION 0.25 MG/5ML
0.2500 mg | Freq: Once | INTRAVENOUS | Status: AC
  Administered 2019-01-15: 10:00:00 0.25 mg via INTRAVENOUS

## 2019-01-15 MED ORDER — FAMOTIDINE IN NACL 20-0.9 MG/50ML-% IV SOLN
20.0000 mg | Freq: Once | INTRAVENOUS | Status: AC
  Administered 2019-01-15: 20 mg via INTRAVENOUS

## 2019-01-15 MED ORDER — HEPARIN SOD (PORK) LOCK FLUSH 100 UNIT/ML IV SOLN
500.0000 [IU] | Freq: Once | INTRAVENOUS | Status: AC | PRN
  Administered 2019-01-15: 500 [IU]
  Filled 2019-01-15: qty 5

## 2019-01-15 MED ORDER — SODIUM CHLORIDE 0.9 % IV SOLN
Freq: Once | INTRAVENOUS | Status: AC
  Administered 2019-01-15: 10:00:00 via INTRAVENOUS
  Filled 2019-01-15: qty 250

## 2019-01-15 MED ORDER — SODIUM CHLORIDE 0.9 % IV SOLN
131.2500 mg/m2 | Freq: Once | INTRAVENOUS | Status: AC
  Administered 2019-01-15: 222 mg via INTRAVENOUS
  Filled 2019-01-15: qty 37

## 2019-01-15 MED ORDER — SODIUM CHLORIDE 0.9% FLUSH
10.0000 mL | INTRAVENOUS | Status: DC | PRN
  Administered 2019-01-15: 15:00:00 10 mL
  Filled 2019-01-15: qty 10

## 2019-01-15 MED ORDER — SERTRALINE HCL 25 MG PO TABS: 25.0000 mg | ORAL_TABLET | Freq: Every day | ORAL | 9 refills | Status: DC

## 2019-01-15 MED ORDER — AMLODIPINE BESYLATE 5 MG PO TABS: 5.0000 mg | ORAL_TABLET | Freq: Every day | ORAL | 9 refills | Status: AC

## 2019-01-15 MED ORDER — PALONOSETRON HCL INJECTION 0.25 MG/5ML
INTRAVENOUS | Status: AC
  Filled 2019-01-15: qty 5

## 2019-01-15 MED ORDER — SODIUM CHLORIDE 0.9 % IV SOLN
540.0000 mg | Freq: Once | INTRAVENOUS | Status: AC
  Administered 2019-01-15: 540 mg via INTRAVENOUS
  Filled 2019-01-15: qty 54

## 2019-01-15 MED ORDER — FAMOTIDINE IN NACL 20-0.9 MG/50ML-% IV SOLN
INTRAVENOUS | Status: AC
  Filled 2019-01-15: qty 50

## 2019-01-15 MED ORDER — SODIUM CHLORIDE 0.9% FLUSH
10.0000 mL | Freq: Once | INTRAVENOUS | Status: AC
  Administered 2019-01-15: 10 mL
  Filled 2019-01-15: qty 10

## 2019-01-15 NOTE — Assessment & Plan Note (Signed)
She has remarkable response to therapy with normalization of liver enzymes and near complete response on imaging study We will continue treatment as directed

## 2019-01-15 NOTE — Assessment & Plan Note (Signed)
The patient is reassured with excellent response to treatment She will continue alprazolam as needed along with Zoloft  she appears to be coping well with treatment

## 2019-01-15 NOTE — Patient Instructions (Signed)
Cancer Center °Discharge Instructions for Patients Receiving Chemotherapy ° °Today you received the following chemotherapy agents Taxol; Carboplatin ° °To help prevent nausea and vomiting after your treatment, we encourage you to take your nausea medication as directed °  °If you develop nausea and vomiting that is not controlled by your nausea medication, call the clinic.  ° °BELOW ARE SYMPTOMS THAT SHOULD BE REPORTED IMMEDIATELY: °· *FEVER GREATER THAN 100.5 F °· *CHILLS WITH OR WITHOUT FEVER °· NAUSEA AND VOMITING THAT IS NOT CONTROLLED WITH YOUR NAUSEA MEDICATION °· *UNUSUAL SHORTNESS OF BREATH °· *UNUSUAL BRUISING OR BLEEDING °· TENDERNESS IN MOUTH AND THROAT WITH OR WITHOUT PRESENCE OF ULCERS °· *URINARY PROBLEMS °· *BOWEL PROBLEMS °· UNUSUAL RASH °Items with * indicate a potential emergency and should be followed up as soon as possible. ° °Feel free to call the clinic should you have any questions or concerns. The clinic phone number is [phone]. ° °Please show the CHEMO ALERT CARD at check-in to the Emergency Department and triage nurse. ° ° °

## 2019-01-15 NOTE — Assessment & Plan Note (Signed)
She started to experience problems with neuropathy I recommend reducing the dose of Taxol; since then, her neuropathy is stable We will proceed with treatment with similar dose adjustment Plan to repeat imaging study again in a few months

## 2019-01-15 NOTE — Progress Notes (Signed)
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[facility]) She has remarkable response to therapy with normalization of liver enzymes and near complete response on imaging study We will continue treatment as directed  Pancytopenia, acquired [facility]) She has mild progressive pancytopenia with treatment I plan to adjust the dose of treatment and will proceed without delay  Generalized anxiety disorder The patient is reassured with excellent response to treatment She will continue alprazolam as needed along with Zoloft  she appears to be coping well with treatment  Other constipation I recommend regular use of laxatives the first few days after chemotherapy to avoid constipation   No orders of the defined types were placed in this encounter.   INTERVAL HISTORY: Please see below for problem oriented charting. She returns for further chemotherapy She tolerated recent treatment well except for mild constipation which resolved with laxatives Denies worsening peripheral neuropathy No recent infection, fever or chills She has scant abnormal vaginal discharge that comes and goes but denies bleeding  SUMMARY OF ONCOLOGIC HISTORY: Oncology History Overview Note  She tested positive for PD-L1 25% from liver biopsy  Progressed bevacizumab and keytruda   Cervical cancer ([place])  07/22/2017 Initial Diagnosis   The patient was examined in the wellness center.  Upon vaginal exam, friable mass of tissue presumably from the cervix was interpreted as poorly differentiated high-grade malignant neoplasm.  Follow-up  workup with ultrasound of the uterus revealed multiple large uterine fibroids.   07/22/2017 Pathology Results   Biopsy confirmed poorly differentiated high-grade malignant neoplasm.   08/06/2017 Imaging   6.6 cm uterine mass in area of cervix and upper vagina, highly suspicious for cervical carcinoma.  Mild bilateral iliac lymphadenopathy, consistent with metastatic disease. No abdominal lymphadenopathy identified.  Multiple large liver masses, with areas of central necrosis or scarring, highly suspicious for liver metastases. Although unlikely, it is conceivable that some of these lesions could represent other etiology such as focal nodular hyperplasia or adenomas. Consider further evaluation with PET-CT, abdomen MRI without and with contrast, or percutaneous needle biopsy.  Multiple large uterine fibroids, many showing cystic degeneration.  Bilateral cystic adnexal lesions with probably benign features. Differential diagnosis includes ovarian cysts, endometriomas, and hydrosalpinges. These could also be further assessed on PET-CT or MRI.  No evidence of thoracic metastatic disease.    08/09/2017 Tumor Marker   Patient's tumor was tested for the following markers: CA-125 Results of the tumor marker test revealed 79.2   08/14/2017 Pathology Results   Liver, needle/core biopsy, left hepatic lobe - METASTATIC POORLY DIFFERENTIATED CARCINOMA, SEE COMMENT. Microscopic Comment The tumor is poorly differentiated with abundant necrosis. Pancytokeratin, PAX-8, cytokeratin 7, and p16 are positive. There is weak non-specific staining with TTF-1, synaptophysin (very focal), p63 (very focal). Chromogranin, CD56, S100, desmin, SMA, ER, CD10, cytokeratin 10, cytokeratin 5/6, and cytokeratin 20 are negative. Thus, the immunoprofile is suggest of a gynecologic primary. Dr. Lyndon Code has reviewed the case.    08/21/2017 Procedure   Ultrasound and fluoroscopically guided right internal jugular single  lumen power port catheter insertion. Tip in the SVC/RA junction. Catheter ready for use.   08/30/2017 -  01/03/2018 Chemotherapy   The patient had received cisplatin and Taxol x 6 cycles   09/12/2017 - 06/26/2018 Chemotherapy   The patient had bevacizumab for chemotherapy treatment.    10/24/2017 Imaging   Interval resolution of large cervical mass and bilateral iliac lymphadenopathy since previous study.  Significant decrease in size of diffuse liver metastases since prior study. No new or progressive metastatic disease identified.  Stable probably benign cystic lesion in right adnexa. Previously seen left adnexal cystic lesion has resolved since prior study.  Stable large uterine fibroids. Increased mild right hydronephrosis due to extrinsic compression of ureter by enlarged uterus.   02/20/2018 Imaging   Interval improvement in diffuse liver metastases since prior study. No new or progressive metastatic disease.  Stable large uterine fibroids.  No significant change in tubular cystic lesion in right adnexa, suspicious for hydrosalpinx or endometrioma.   06/05/2018 Imaging   CT AP W Contrast 06/05/18 IMPRESSION: New mild lymphadenopathy in left paraaortic region and porta hepatis, and slight increase in size of right external iliac lymph node, consistent with metastatic disease.  Increased soft tissue prominence in region of cervix and right vaginal cuff, consistent with local tumor progression.  Multiple liver metastases, with mild decrease in size of several metastatic lesions.  Stable uterine fibroids.   06/27/2018 - 08/29/2018 Chemotherapy   The patient had Keytruda   09/19/2018 Imaging   1. Increased size of large masses in the cervix and lower uterine segment, consistent with local recurrence of cervical carcinoma. This results in new moderate hydrometros. 2. Increased abdominal and pelvic lymphadenopathy, consistent with metastatic disease. 3. Markedly increased size of a  central right hepatic lobe metastasis. Other smaller liver metastases show no significant change. 4. Stable large uterine fibroids.    10/03/2018 -  Chemotherapy   The patient had carboplatin and taxol    12/04/2018 Imaging   1. Interval decrease in size of liver metastases since previous study. 2. Resolution of porta hepatis lymphadenopathy since prior study. 3. No new or progressive metastatic disease. 4. Stable large calcified uterine fibroids.    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([place])  08/09/2017 Initial Diagnosis   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[facility])   09/12/2017 - 06/26/2018 Chemotherapy   The patient had bevacizumab (AVASTIN) 800 mg in sodium chloride 0.9 % 100 mL chemo infusion, 15 mg/kg = 800 mg, Intravenous,  Once, 10 of 10 cycles Dose modification: 15 mg/kg (original dose 15 mg/kg, Cycle 8, Reason: Provider Judgment), 15 mg/kg (original dose 15 mg/kg, Cycle 9, Reason: Other (see comments)) Administration: 800 mg (09/20/2017), 800 mg (11/01/2017), 800 mg (11/22/2017), 800 mg (12/13/2017), 800 mg (02/21/2018), 800 mg (04/04/2018), 1,000 mg (04/25/2018), 1,000 mg (05/16/2018), 1,000 mg (06/06/2018)  for chemotherapy treatment.    06/27/2018 - 09/21/2018 Chemotherapy   The patient had pembrolizumab (KEYTRUDA) 200 mg in sodium chloride 0.9 % 50 mL chemo infusion, 200 mg, Intravenous, Once, 4 of 5 cycles Administration: 200 mg (06/27/2018), 200 mg (07/18/2018), 200 mg (08/08/2018), 200 mg (08/29/2018)  for chemotherapy treatment.    10/03/2018 -  Chemotherapy   The patient had palonosetron (ALOXI) injection 0.25 mg, 0.25 mg, Intravenous,  Once, 5 of 7 cycles Administration: 0.25 mg (10/03/2018), 0.25 mg (10/23/2018), 0.25 mg (11/13/2018), 0.25 mg (12/05/2018), 0.25 mg (12/26/2018) CARBOplatin (PARAPLATIN) 540 mg in sodium chloride 0.9 % 250 mL chemo infusion, 540 mg (98.1 % of original dose 549 mg), Intravenous,  Once, 5 of 7 cycles Dose modification:   (original dose 549 mg, Cycle 1), 500 mg (original  dose 549 mg,  Cycle 6, Reason: Dose not tolerated) Administration: 540 mg (10/03/2018), 540 mg (10/23/2018), 540 mg (11/13/2018), 540 mg (12/05/2018), 550 mg (12/26/2018) PACLitaxel (TAXOL) 294 mg in sodium chloride 0.9 % 250 mL chemo infusion (> 62m/m2), 175 mg/m2 = 294 mg, Intravenous,  Once, 5 of 7 cycles Dose modification: 131.25 mg/m2 (75 % of original dose 175 mg/m2, Cycle 5, Reason: Dose Not Tolerated) Administration: 294 mg (10/03/2018), 294 mg (10/23/2018), 294 mg (11/13/2018), 294 mg (12/05/2018), 222 mg (12/26/2018)  for chemotherapy treatment.      REVIEW OF SYSTEMS:   Constitutional: Denies fevers, chills or abnormal weight loss Eyes: Denies blurriness of vision Ears, nose, mouth, throat, and face: Denies mucositis or sore throat Respiratory: Denies cough, dyspnea or wheezes Cardiovascular: Denies palpitation, chest discomfort or lower extremity swelling Skin: Denies abnormal skin rashes Lymphatics: Denies new lymphadenopathy or easy bruising Neurological:Denies numbness, tingling or new weaknesses Behavioral/Psych: Mood is stable, no new changes  All other systems were reviewed with the patient and are negative.  I have reviewed the past medical history, past surgical history, social history and family history with the patient and they are unchanged from previous note.  ALLERGIES:  is allergic to benadryl [diphenhydramine].  MEDICATIONS:  Current Outpatient Medications  Medication Sig Dispense Refill  . acetaminophen (TYLENOL) 325 MG tablet Take 650 mg by mouth every 6 (six) hours as needed.    . ALPRAZolam (XANAX) 0.5 MG tablet Take 1 tablet (0.5 mg total) by mouth at bedtime as needed for anxiety. 60 tablet 0  . amLODipine (NORVASC) 5 MG tablet Take 1 tablet (5 mg total) by mouth daily. TAKE 1 TABLET(10 MG) BY MOUTH DAILY 90 tablet 9  . dexamethasone (DECADRON) 4 MG tablet Take 2 tabs at the night before and 2 tab the morning of chemotherapy, every 3 weeks, by mouth 12 tablet 0  . levothyroxine  (SYNTHROID, LEVOTHROID) 25 MCG tablet Take 1 tablet (25 mcg total) by mouth daily before breakfast. 30 tablet 1  . lidocaine-prilocaine (EMLA) cream Apply to affected area once 30 g 3  . magnesium oxide (MAG-OX) 400 (241.3 Mg) MG tablet Take 1 tablet (400 mg total) by mouth 2 (two) times daily. 60 tablet 6  . morphine (MSIR) 15 MG tablet Take 1 tablet (15 mg total) by mouth every 6 (six) hours as needed for severe pain. 30 tablet 0  . ondansetron (ZOFRAN) 8 MG tablet Take 1 tablet (8 mg total) by mouth every 8 (eight) hours as needed for nausea. 30 tablet 3  . prochlorperazine (COMPAZINE) 10 MG tablet Take 1 tablet (10 mg total) by mouth every 6 (six) hours as needed for nausea or vomiting. 30 tablet 0  . sertraline (ZOLOFT) 25 MG tablet Take 1 tablet (25 mg total) by mouth daily. 30 tablet 9   No current facility-administered medications for this visit.     PHYSICAL EXAMINATION: ECOG PERFORMANCE STATUS: 1 - Symptomatic but completely ambulatory  Vitals:   01/15/19 0853  BP: 130/76  Pulse: (!) 110  Resp: 18  Temp: 98.5 F (36.9 C)  SpO2: 100%   There were no vitals filed for this visit.  GENERAL:alert, no distress and comfortable SKIN: skin color, texture, turgor are normal, no rashes or significant lesions EYES: normal, Conjunctiva are pink and non-injected, sclera clear OROPHARYNX:no exudate, no erythema and lips, buccal mucosa, and tongue normal  NECK: supple, thyroid normal size, non-tender, without nodularity LYMPH:  no palpable lymphadenopathy in the cervical, axillary or inguinal LUNGS: clear  to auscultation and percussion with normal breathing effort HEART: regular rate & rhythm and no murmurs and no lower extremity edema ABDOMEN:abdomen soft, non-tender and normal bowel sounds Musculoskeletal:no cyanosis of digits and no clubbing  NEURO: alert & oriented x 3 with fluent speech, no focal motor/sensory deficits  LABORATORY DATA:  I have reviewed the data as listed     Component Value Date/Time   NA 141 12/26/2018 1047   K 3.8 12/26/2018 1047   CL 103 12/26/2018 1047   CO2 25 12/26/2018 1047   GLUCOSE 152 (H) 12/26/2018 1047   BUN 13 12/26/2018 1047   CREATININE 0.75 12/26/2018 1047   CREATININE 0.85 02/20/2018 0850   CALCIUM 10.1 12/26/2018 1047   PROT 8.1 12/26/2018 1047   ALBUMIN 4.3 12/26/2018 1047   AST 32 12/26/2018 1047   AST 22 02/20/2018 0850   ALT 11 12/26/2018 1047   ALT 19 02/20/2018 0850   ALKPHOS 121 12/26/2018 1047   BILITOT 0.4 12/26/2018 1047   BILITOT 0.6 02/20/2018 0850   GFRNONAA >60 12/26/2018 1047   GFRNONAA >60 02/20/2018 0850   GFRAA >60 12/26/2018 1047   GFRAA >60 02/20/2018 0850    No results found for: SPEP, UPEP  Lab Results  Component Value Date   WBC 4.7 01/15/2019   NEUTROABS 4.1 01/15/2019   HGB 11.6 (L) 01/15/2019   HCT 35.6 (L) 01/15/2019   MCV 98.1 01/15/2019   PLT 129 (L) 01/15/2019      Chemistry      Component Value Date/Time   NA 141 12/26/2018 1047   K 3.8 12/26/2018 1047   CL 103 12/26/2018 1047   CO2 25 12/26/2018 1047   BUN 13 12/26/2018 1047   CREATININE 0.75 12/26/2018 1047   CREATININE 0.85 02/20/2018 0850      Component Value Date/Time   CALCIUM 10.1 12/26/2018 1047   ALKPHOS 121 12/26/2018 1047   AST 32 12/26/2018 1047   AST 22 02/20/2018 0850   ALT 11 12/26/2018 1047   ALT 19 02/20/2018 0850   BILITOT 0.4 12/26/2018 1047   BILITOT 0.6 02/20/2018 0850       All questions were answered. The patient knows to call the clinic with any problems, questions or concerns. No barriers to learning was detected.  I spent 25 minutes counseling the patient face to face. The total time spent in the appointment was 40 minutes and more than 50% was on counseling and review of test results  Heath Lark, MD 01/15/2019 9:31 AM

## 2019-01-15 NOTE — Assessment & Plan Note (Signed)
She has mild progressive pancytopenia with treatment I plan to adjust the dose of treatment and will proceed without delay

## 2019-01-15 NOTE — Assessment & Plan Note (Signed)
I recommend regular use of laxatives the first few days after chemotherapy to avoid constipation

## 2019-01-21 ENCOUNTER — Telehealth: Payer: Self-pay

## 2019-01-21 NOTE — Telephone Encounter (Signed)
She called and left a message to call her.  Called back. She went to the ER on 6/25 for severe constipation. She was discharged and went home and took x 2 enemas and stool softeners. She had good results. Had bm today. She was told potassium was low at 3.4, instructed to eat a potassium rich diet. She verbalized understanding. She is taking Miralax daily a night and x 2 stool softeners nightly. She is complaining of being tired and having a lot of leg pain.  Should she take anything else to prevent constipation?

## 2019-01-22 NOTE — Telephone Encounter (Signed)
Left below message. Ask her to call the office back if she has questions.

## 2019-01-22 NOTE — Telephone Encounter (Signed)
I suggest at least daily miralax Add sennokot 2 BID or TID if no bowel movement in 2 days

## 2019-01-26 IMAGING — US IR US GUIDE VASC ACCESS RIGHT
1 series · 2 of 2 positions shown · non-contrast
Comparison: none

CLINICAL DATA: METASTATIC ENDOMETRIAL CARCINOMA

[Series 1: ir fluoro/shunt/fist · 2 of 2 slices shown]
[im 1/2]
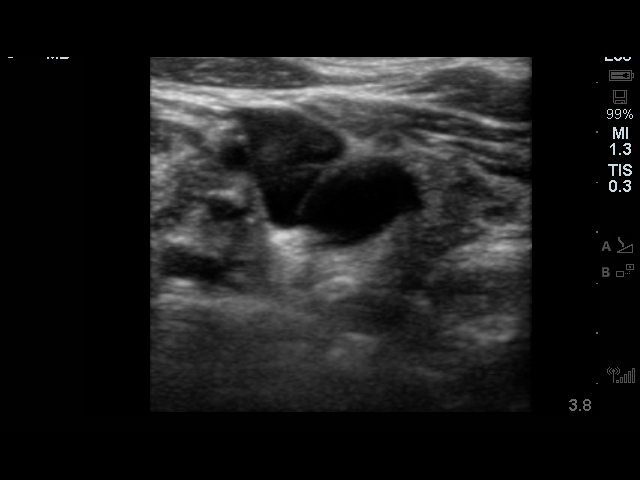
[im 2/2]
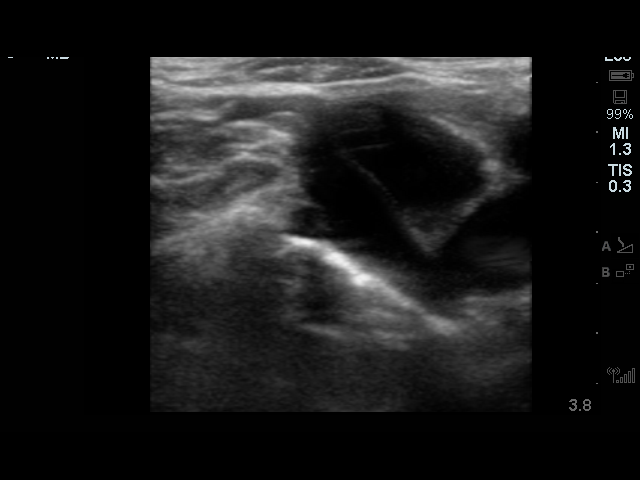

[2 of 2 positions shown; findings below may reference images not displayed]

EXAM:
RIGHT INTERNAL JUGULAR SINGLE LUMEN POWER PORT CATHETER INSERTION

Radiologist:  Mejlak, Larwrence Gellel

Guidance:  Ultrasound and fluoroscopic

MEDICATIONS:
Ancef 2 g; The antibiotic was administered within an appropriate
time interval prior to skin puncture.

ANESTHESIA/SEDATION:
Versed 2.0 mg IV; Fentanyl 100 mcg IV;

Moderate Sedation Time:  27 minutes

The patient was continuously monitored during the procedure by the
interventional radiology nurse under my direct supervision.

FLUOROSCOPY TIME:  One minutes, 6 seconds (5 mGy)

COMPLICATIONS:
None immediate.

CONTRAST:  None.

PROCEDURE:
Informed consent was obtained from the patient following explanation
of the procedure, risks, benefits and alternatives. The patient
understands, agrees and consents for the procedure. All questions
were addressed. A time out was performed.

Maximal barrier sterile technique utilized including caps, mask,
sterile gowns, sterile gloves, large sterile drape, hand hygiene,
and 2% chlorhexidine scrub.

Under sterile conditions and local anesthesia, right internal
jugular micropuncture venous access was performed. Access was
performed with ultrasound. Images were obtained for documentation. A
guide wire was inserted followed by a transitional dilator. This
allowed insertion of a guide wire and catheter into the IVC.
Measurements were obtained from the SVC / RA junction back to the
right IJ venotomy site. In the right infraclavicular chest, a
subcutaneous pocket was created over the second anterior rib. This
was done under sterile conditions and local anesthesia. 1% lidocaine
with epinephrine was utilized for this. A 2.5 cm incision was made
in the skin. Blunt dissection was performed to create a subcutaneous
pocket over the right pectoralis major muscle. The pocket was
flushed with saline vigorously. There was adequate hemostasis. The
port catheter was assembled and checked for leakage. The port
catheter was secured in the pocket with two retention sutures. The
tubing was tunneled subcutaneously to the right venotomy site and
inserted into the SVC/RA junction through a valved peel-away sheath.
Position was confirmed with fluoroscopy. Images were obtained for
documentation. The patient tolerated the procedure well. No
immediate complications. Incisions were closed in a two layer
fashion with 4 - 0 Vicryl suture. Dermabond was applied to the skin.
The port catheter was accessed, blood was aspirated followed by
saline and heparin flushes. Needle was removed. A dry sterile
dressing was applied.
IMPRESSION: Ultrasound and fluoroscopically guided right internal jugular single
lumen power port catheter insertion. Tip in the SVC/RA junction.
Catheter ready for use.

## 2019-01-29 ENCOUNTER — Other Ambulatory Visit: Payer: Self-pay | Admitting: Hematology and Oncology

## 2019-02-02 ENCOUNTER — Telehealth: Payer: Self-pay | Admitting: *Deleted

## 2019-02-02 NOTE — Telephone Encounter (Signed)
Patient calling for a refill on the msir tablets.   Patient states she would like to talk to you about this. The pain medication is "not Working" when asked about this she says she has to take tylenol with it and she has to take it every 6 hours. She also states she had difficulty with bowel movements and found out the pain medication was the reason. Patient will discuss further on Friday.

## 2019-02-02 NOTE — Telephone Encounter (Signed)
Please send in a refill for the 15mg  tablets. She does not have any more right now. She will stick to the 15mg  tablets and take 2 when needed rather than take the full 30mg  all the time. She will continue to take tylenol with it.

## 2019-02-02 NOTE — Telephone Encounter (Signed)
Some people can find relief by taking Tylenol and morphine together If she wants it stronger, we can increase to 30 mg or she can take it more frequently at every 4 hours Can you call if she wants 30 mg or keep at 15 mg but take it every 4 hours along with tylenol? Does she have enough until Friday?

## 2019-02-03 ENCOUNTER — Other Ambulatory Visit: Payer: Self-pay | Admitting: Hematology and Oncology

## 2019-02-03 MED ORDER — MORPHINE SULFATE 15 MG PO TABS: 15.0000 mg | ORAL_TABLET | ORAL | 0 refills | Status: DC | PRN

## 2019-02-03 NOTE — Telephone Encounter (Signed)
Lm for patient- prescription was sent to the pharmacy

## 2019-02-03 NOTE — Telephone Encounter (Signed)
I increased the frequency to q4hours prn

## 2019-02-06 ENCOUNTER — Inpatient Hospital Stay: Payer: Medicaid Other

## 2019-02-06 ENCOUNTER — Encounter: Payer: Self-pay | Admitting: Hematology and Oncology

## 2019-02-06 ENCOUNTER — Inpatient Hospital Stay (HOSPITAL_BASED_OUTPATIENT_CLINIC_OR_DEPARTMENT_OTHER): Payer: Medicaid Other | Admitting: Hematology and Oncology

## 2019-02-06 ENCOUNTER — Other Ambulatory Visit: Payer: Self-pay

## 2019-02-06 ENCOUNTER — Inpatient Hospital Stay: Payer: Medicaid Other | Attending: Hematology and Oncology

## 2019-02-06 DIAGNOSIS — D61818 Other pancytopenia: Secondary | ICD-10-CM | POA: Insufficient documentation

## 2019-02-06 DIAGNOSIS — C530 Malignant neoplasm of endocervix: Secondary | ICD-10-CM

## 2019-02-06 DIAGNOSIS — C539 Malignant neoplasm of cervix uteri, unspecified: Secondary | ICD-10-CM

## 2019-02-06 DIAGNOSIS — G893 Neoplasm related pain (acute) (chronic): Secondary | ICD-10-CM | POA: Insufficient documentation

## 2019-02-06 DIAGNOSIS — Z79899 Other long term (current) drug therapy: Secondary | ICD-10-CM | POA: Diagnosis not present

## 2019-02-06 DIAGNOSIS — R5383 Other fatigue: Secondary | ICD-10-CM

## 2019-02-06 DIAGNOSIS — C787 Secondary malignant neoplasm of liver and intrahepatic bile duct: Secondary | ICD-10-CM

## 2019-02-06 DIAGNOSIS — Z5111 Encounter for antineoplastic chemotherapy: Secondary | ICD-10-CM | POA: Diagnosis not present

## 2019-02-06 DIAGNOSIS — Z7189 Other specified counseling: Secondary | ICD-10-CM

## 2019-02-06 DIAGNOSIS — K5909 Other constipation: Secondary | ICD-10-CM | POA: Diagnosis not present

## 2019-02-06 DIAGNOSIS — D500 Iron deficiency anemia secondary to blood loss (chronic): Secondary | ICD-10-CM

## 2019-02-06 LAB — CBC WITH DIFFERENTIAL/PLATELET
Abs Immature Granulocytes: 0.02 10*3/uL (ref 0.00–0.07)
Basophils Absolute: 0 10*3/uL (ref 0.0–0.1)
Basophils Relative: 0 %
Eosinophils Absolute: 0 10*3/uL (ref 0.0–0.5)
Eosinophils Relative: 0 %
HCT: 29 % — ABNORMAL LOW (ref 36.0–46.0)
Hemoglobin: 9.6 g/dL — ABNORMAL LOW (ref 12.0–15.0)
Immature Granulocytes: 0 %
Lymphocytes Relative: 8 %
Lymphs Abs: 0.4 10*3/uL — ABNORMAL LOW (ref 0.7–4.0)
MCH: 33 pg (ref 26.0–34.0)
MCHC: 33.1 g/dL (ref 30.0–36.0)
MCV: 99.7 fL (ref 80.0–100.0)
Monocytes Absolute: 0.2 10*3/uL (ref 0.1–1.0)
Monocytes Relative: 3 %
Neutro Abs: 4.7 10*3/uL (ref 1.7–7.7)
Neutrophils Relative %: 89 %
Platelets: 78 10*3/uL — ABNORMAL LOW (ref 150–400)
RBC: 2.91 MIL/uL — ABNORMAL LOW (ref 3.87–5.11)
RDW: 15.6 % — ABNORMAL HIGH (ref 11.5–15.5)
WBC: 5.3 10*3/uL (ref 4.0–10.5)
nRBC: 0 % (ref 0.0–0.2)

## 2019-02-06 LAB — COMPREHENSIVE METABOLIC PANEL
ALT: 6 U/L (ref 0–44)
AST: 28 U/L (ref 15–41)
Albumin: 3.9 g/dL (ref 3.5–5.0)
Alkaline Phosphatase: 106 U/L (ref 38–126)
Anion gap: 10 (ref 5–15)
BUN: 9 mg/dL (ref 6–20)
CO2: 29 mmol/L (ref 22–32)
Calcium: 9.1 mg/dL (ref 8.9–10.3)
Chloride: 101 mmol/L (ref 98–111)
Creatinine, Ser: 0.64 mg/dL (ref 0.44–1.00)
GFR calc Af Amer: >60 mL/min (ref >60)
GFR calc non Af Amer: >60 mL/min (ref >60)
Glucose, Bld: 107 mg/dL — ABNORMAL HIGH (ref 70–99)
Potassium: 4 mmol/L (ref 3.5–5.1)
Sodium: 140 mmol/L (ref 135–145)
Total Bilirubin: 0.4 mg/dL (ref 0.3–1.2)
Total Protein: 7.2 g/dL (ref 6.5–8.1)

## 2019-02-06 LAB — TSH: TSH: 1.999 u[IU]/mL (ref 0.308–3.960)

## 2019-02-06 MED ORDER — FAMOTIDINE IN NACL 20-0.9 MG/50ML-% IV SOLN
20.0000 mg | Freq: Once | INTRAVENOUS | Status: AC
  Administered 2019-02-06: 11:00:00 20 mg via INTRAVENOUS

## 2019-02-06 MED ORDER — LORATADINE 10 MG PO TABS
ORAL_TABLET | ORAL | Status: AC
  Filled 2019-02-06: qty 1

## 2019-02-06 MED ORDER — SODIUM CHLORIDE 0.9% FLUSH
10.0000 mL | INTRAVENOUS | Status: DC | PRN
  Administered 2019-02-06: 17:00:00 10 mL
  Filled 2019-02-06: qty 10

## 2019-02-06 MED ORDER — LORATADINE 10 MG PO TABS: 10.0000 mg | ORAL_TABLET | Freq: Once | ORAL | Status: DC

## 2019-02-06 MED ORDER — SODIUM CHLORIDE 0.9 % IV SOLN
131.2500 mg/m2 | Freq: Once | INTRAVENOUS | Status: AC
  Administered 2019-02-06: 13:00:00 222 mg via INTRAVENOUS
  Filled 2019-02-06: qty 37

## 2019-02-06 MED ORDER — HEPARIN SOD (PORK) LOCK FLUSH 100 UNIT/ML IV SOLN
500.0000 [IU] | Freq: Once | INTRAVENOUS | Status: AC | PRN
  Administered 2019-02-06: 500 [IU]
  Filled 2019-02-06: qty 5

## 2019-02-06 MED ORDER — PALONOSETRON HCL INJECTION 0.25 MG/5ML
0.2500 mg | Freq: Once | INTRAVENOUS | Status: AC
  Administered 2019-02-06: 0.25 mg via INTRAVENOUS

## 2019-02-06 MED ORDER — SODIUM CHLORIDE 0.9 % IV SOLN
20.0000 mg | Freq: Once | INTRAVENOUS | Status: AC
  Administered 2019-02-06: 12:00:00 20 mg via INTRAVENOUS
  Filled 2019-02-06: qty 2

## 2019-02-06 MED ORDER — SODIUM CHLORIDE 0.9 % IV SOLN
450.0000 mg | Freq: Once | INTRAVENOUS | Status: AC
  Administered 2019-02-06: 450 mg via INTRAVENOUS
  Filled 2019-02-06: qty 45

## 2019-02-06 MED ORDER — PALONOSETRON HCL INJECTION 0.25 MG/5ML
INTRAVENOUS | Status: AC
  Filled 2019-02-06: qty 5

## 2019-02-06 MED ORDER — FAMOTIDINE IN NACL 20-0.9 MG/50ML-% IV SOLN
INTRAVENOUS | Status: AC
  Filled 2019-02-06: qty 50

## 2019-02-06 MED ORDER — SODIUM CHLORIDE 0.9 % IV SOLN
Freq: Once | INTRAVENOUS | Status: AC
  Administered 2019-02-06: 11:00:00 via INTRAVENOUS
  Filled 2019-02-06: qty 250

## 2019-02-06 MED ORDER — SODIUM CHLORIDE 0.9% FLUSH
10.0000 mL | Freq: Once | INTRAVENOUS | Status: AC
  Administered 2019-02-06: 10:00:00 10 mL
  Filled 2019-02-06: qty 10

## 2019-02-06 NOTE — Assessment & Plan Note (Signed)
She has mild intermittent constipation likely exacerbated by narcotic prescription We discussed laxative therapy

## 2019-02-06 NOTE — Patient Instructions (Signed)
[facility] Discharge Instructions for Patients Receiving Chemotherapy  Today you received the following chemotherapy agents Paclitaxel (TAXOL) & Carboplatin (PARAPLATIN).  To help prevent nausea and vomiting after your treatment, we encourage you to take your nausea medication as prescribed.   If you develop nausea and vomiting that is not controlled by your nausea medication, call the clinic.   BELOW ARE SYMPTOMS THAT SHOULD BE REPORTED IMMEDIATELY:  *FEVER GREATER THAN 100.5 F  *CHILLS WITH OR WITHOUT FEVER  NAUSEA AND VOMITING THAT IS NOT CONTROLLED WITH YOUR NAUSEA MEDICATION  *UNUSUAL SHORTNESS OF BREATH  *UNUSUAL BRUISING OR BLEEDING  TENDERNESS IN MOUTH AND THROAT WITH OR WITHOUT PRESENCE OF ULCERS  *URINARY PROBLEMS  *BOWEL PROBLEMS  UNUSUAL RASH Items with * indicate a potential emergency and should be followed up as soon as possible.  Feel free to call the clinic should you have any questions or concerns. The clinic phone number is [phone].  Please show the CHEMO ALERT CARD at check-in to the Emergency Department and triage nurse.  Coronavirus (COVID-19) Are you at risk?  Are you at risk for the Coronavirus (COVID-19)?  To be considered HIGH RISK for Coronavirus (COVID-19), you have to meet the following criteria:  . Traveled to [place], [place], [place], [place] or [place]; or in the [place] to [place], [place], [place], or [place]; and have fever, cough, and shortness of breath within the last 2 weeks of travel OR . Been in close contact with a person diagnosed with COVID-19 within the last 2 weeks and have fever, cough, and shortness of breath . IF YOU DO NOT MEET THESE CRITERIA, YOU ARE CONSIDERED LOW RISK FOR COVID-19.  What to do if you are HIGH RISK for COVID-19?  . If you are having a medical emergency, call 911. . Seek medical care right away. Before you go to a doctor's office, urgent care or emergency department,  call ahead and tell them about your recent travel, contact with someone diagnosed with COVID-19, and your symptoms. You should receive instructions from your physician's office regarding next steps of care.  . When you arrive at healthcare provider, tell the healthcare staff immediately you have returned from visiting [place], [place], [place], [place] or [place]; or traveled in the [place] to [place], [place], [place], or [place]; in the last two weeks or you have been in close contact with a person diagnosed with COVID-19 in the last 2 weeks.   . Tell the health care staff about your symptoms: fever, cough and shortness of breath. . After you have been seen by a medical provider, you will be either: o Tested for (COVID-19) and discharged home on quarantine except to seek medical care if symptoms worsen, and asked to  - Stay home and avoid contact with others until you get your results (4-5 days)  - Avoid travel on public transportation if possible (such as bus, train, or airplane) or o Sent to the Emergency Department by EMS for evaluation, COVID-19 testing, and possible admission depending on your condition and test results.  What to do if you are LOW RISK for COVID-19?  Reduce your risk of any infection by using the same precautions used for avoiding the common cold or flu:  . Wash your hands often with soap and warm water for at least 20 seconds.  If soap and water are not readily available, use an alcohol-based hand sanitizer with at least 60% alcohol.  .   If coughing or sneezing, cover your mouth and nose by coughing or sneezing into the elbow areas of your shirt or coat, into a tissue or into your sleeve (not your hands). . Avoid shaking hands with others and consider head nods or verbal greetings only. . Avoid touching your eyes, nose, or mouth with unwashed hands.  . Avoid close contact with people who are sick. . Avoid places or events with large numbers of people in one  location, like concerts or sporting events. . Carefully consider travel plans you have or are making. . If you are planning any travel outside or inside the [place], visit the CDC's Travelers' Health webpage for the latest health notices. . If you have some symptoms but not all symptoms, continue to monitor at home and seek medical attention if your symptoms worsen. . If you are having a medical emergency, call 911.   ADDITIONAL HEALTHCARE OPTIONS FOR PATIENTS  [facility] / e-Visit: [email or web address]         [facility]: [phone]  [facility]: [phone]                   [facility]: [phone]   

## 2019-02-06 NOTE — Assessment & Plan Note (Signed)
She had recent CT scan done in an outside facility We will request imaging study Based on the report, she continues to have positive response of therapy We will proceed with treatment with further dose adjustment due to worsening pancytopenia Next month, I plan to give her an additional week of break to allow for bone marrow recovery

## 2019-02-06 NOTE — Assessment & Plan Note (Signed)
According to outside CT report, she is responding to treatment We will continue to monitor carefully with serial imaging study every 3 months

## 2019-02-06 NOTE — Progress Notes (Signed)
[facility] OFF[facility] PROGRESS NOTE  Patient Care Team: Avel Sensor, MD as PCP - General (Obstetrics and Gynecology)  ASSESSMENT & PLAN:  Cervical cancer [facility]) She had recent CT scan done in an outside facility We will request imaging study Based on the report, she continues to have positive response of therapy We will proceed with treatment with further dose adjustment due to worsening pancytopenia Next month, I plan to give her an additional week of break to allow for bone marrow recovery  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[facility]) According to outside CT report, she is responding to treatment We will continue to monitor carefully with serial imaging study every 3 months  Cancer associated pain Her cancer pain is improving with IR morphine She will continue the same I warned her about risk of constipation  Other constipation She has mild intermittent constipation likely exacerbated by narcotic prescription We discussed laxative therapy  Pancytopenia, acquired ([place]) She has mild worsening pancytopenia from treatment We will proceed with treatment without delay but with further dose adjustment of carboplatin I also plan to give her an extra week of break from treatment prior to her next cycle of therapy She agreed with the plan of care   No orders of the defined types were placed in this encounter.   INTERVAL HISTORY: Please see below for problem oriented charting. She returns for chemotherapy She went to outside facility recently for lower abdominal pain CT imaging show regression of the size of the liver lesions Her symptoms improve subsequently with additional prescription narcotics of IR morphine She has some mild intermittent constipation She has minimum vaginal discharge She denies worsening neuropathy with prior treatment  SUMMARY OF ONCOLOGIC HISTORY: Oncology History Overview Note  She tested positive for PD-L1 25% from liver biopsy  Progressed  bevacizumab and keytruda   Cervical cancer ([place])  07/22/2017 Initial Diagnosis   The patient was examined in the wellness center.  Upon vaginal exam, friable mass of tissue presumably from the cervix was interpreted as poorly differentiated high-grade malignant neoplasm.  Follow-up workup with ultrasound of the uterus revealed multiple large uterine fibroids.   07/22/2017 Pathology Results   Biopsy confirmed poorly differentiated high-grade malignant neoplasm.   08/06/2017 Imaging   6.6 cm uterine mass in area of cervix and upper vagina, highly suspicious for cervical carcinoma.  Mild bilateral iliac lymphadenopathy, consistent with metastatic disease. No abdominal lymphadenopathy identified.  Multiple large liver masses, with areas of central necrosis or scarring, highly suspicious for liver metastases. Although unlikely, it is conceivable that some of these lesions could represent other etiology such as focal nodular hyperplasia or adenomas. Consider further evaluation with PET-CT, abdomen MRI without and with contrast, or percutaneous needle biopsy.  Multiple large uterine fibroids, many showing cystic degeneration.  Bilateral cystic adnexal lesions with probably benign features. Differential diagnosis includes ovarian cysts, endometriomas, and hydrosalpinges. These could also be further assessed on PET-CT or MRI.  No evidence of thoracic metastatic disease.    08/09/2017 Tumor Marker   Patient's tumor was tested for the following markers: CA-125 Results of the tumor marker test revealed 79.2   08/14/2017 Pathology Results   Liver, needle/core biopsy, left hepatic lobe - METASTATIC POORLY DIFFERENTIATED CARCINOMA, SEE COMMENT. Microscopic Comment The tumor is poorly differentiated with abundant necrosis. Pancytokeratin, PAX-8, cytokeratin 7, and p16 are positive. There is weak non-specific staining with TTF-1, synaptophysin (very focal), p63 (very focal). Chromogranin, CD56,  S100, desmin, SMA, ER, CD10, cytokeratin 10, cytokeratin 5/6, and cytokeratin  20 are negative. Thus, the immunoprofile is suggest of a gynecologic primary. Dr. Lyndon Code has reviewed the case.    08/21/2017 Procedure   Ultrasound and fluoroscopically guided right internal jugular single lumen power port catheter insertion. Tip in the SVC/RA junction. Catheter ready for use.   08/30/2017 - 01/03/2018 Chemotherapy   The patient had received cisplatin and Taxol x 6 cycles   09/12/2017 - 06/26/2018 Chemotherapy   The patient had bevacizumab for chemotherapy treatment.    10/24/2017 Imaging   Interval resolution of large cervical mass and bilateral iliac lymphadenopathy since previous study.  Significant decrease in size of diffuse liver metastases since prior study. No new or progressive metastatic disease identified.  Stable probably benign cystic lesion in right adnexa. Previously seen left adnexal cystic lesion has resolved since prior study.  Stable large uterine fibroids. Increased mild right hydronephrosis due to extrinsic compression of ureter by enlarged uterus.   02/20/2018 Imaging   Interval improvement in diffuse liver metastases since prior study. No new or progressive metastatic disease.  Stable large uterine fibroids.  No significant change in tubular cystic lesion in right adnexa, suspicious for hydrosalpinx or endometrioma.   06/05/2018 Imaging   CT AP W Contrast 06/05/18 IMPRESSION: New mild lymphadenopathy in left paraaortic region and porta hepatis, and slight increase in size of right external iliac lymph node, consistent with metastatic disease.  Increased soft tissue prominence in region of cervix and right vaginal cuff, consistent with local tumor progression.  Multiple liver metastases, with mild decrease in size of several metastatic lesions.  Stable uterine fibroids.   06/27/2018 - 08/29/2018 Chemotherapy   The patient had Keytruda   09/19/2018 Imaging   1. Increased  size of large masses in the cervix and lower uterine segment, consistent with local recurrence of cervical carcinoma. This results in new moderate hydrometros. 2. Increased abdominal and pelvic lymphadenopathy, consistent with metastatic disease. 3. Markedly increased size of a central right hepatic lobe metastasis. Other smaller liver metastases show no significant change. 4. Stable large uterine fibroids.    10/03/2018 -  Chemotherapy   The patient had carboplatin and taxol    12/04/2018 Imaging   1. Interval decrease in size of liver metastases since previous study. 2. Resolution of porta hepatis lymphadenopathy since prior study. 3. No new or progressive metastatic disease. 4. Stable large calcified uterine fibroids.    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([place])  08/09/2017 Initial Diagnosis   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[facility])   09/12/2017 - 06/26/2018 Chemotherapy   The patient had bevacizumab (AVASTIN) 800 mg in sodium chloride 0.9 % 100 mL chemo infusion, 15 mg/kg = 800 mg, Intravenous,  Once, 10 of 10 cycles Dose modification: 15 mg/kg (original dose 15 mg/kg, Cycle 8, Reason: Provider Judgment), 15 mg/kg (original dose 15 mg/kg, Cycle 9, Reason: Other (see comments)) Administration: 800 mg (09/20/2017), 800 mg (11/01/2017), 800 mg (11/22/2017), 800 mg (12/13/2017), 800 mg (02/21/2018), 800 mg (04/04/2018), 1,000 mg (04/25/2018), 1,000 mg (05/16/2018), 1,000 mg (06/06/2018)  for chemotherapy treatment.    06/27/2018 - 09/21/2018 Chemotherapy   The patient had pembrolizumab (KEYTRUDA) 200 mg in sodium chloride 0.9 % 50 mL chemo infusion, 200 mg, Intravenous, Once, 4 of 5 cycles Administration: 200 mg (06/27/2018), 200 mg (07/18/2018), 200 mg (08/08/2018), 200 mg (08/29/2018)  for chemotherapy treatment.    10/03/2018 -  Chemotherapy   The patient had palonosetron (ALOXI) injection 0.25 mg, 0.25 mg, Intravenous,  Once, 7 of 8 cycles Administration: 0.25 mg (10/03/2018), 0.25 mg (10/23/2018),  0.25 mg (11/13/2018), 0.25  mg (12/05/2018), 0.25 mg (12/26/2018), 0.25 mg (01/15/2019) CARBOplatin (PARAPLATIN) 540 mg in sodium chloride 0.9 % 250 mL chemo infusion, 540 mg (98.1 % of original dose 549 mg), Intravenous,  Once, 7 of 8 cycles Dose modification:   (original dose 549 mg, Cycle 1), 500 mg (original dose 549 mg, Cycle 6, Reason: Dose not tolerated), 450 mg (original dose 549 mg, Cycle 7, Reason: Dose not tolerated) Administration: 540 mg (10/03/2018), 540 mg (10/23/2018), 540 mg (11/13/2018), 540 mg (12/05/2018), 550 mg (12/26/2018), 540 mg (01/15/2019) PACLitaxel (TAXOL) 294 mg in sodium chloride 0.9 % 250 mL chemo infusion (> 80m/m2), 175 mg/m2 = 294 mg, Intravenous,  Once, 7 of 8 cycles Dose modification: 131.25 mg/m2 (75 % of original dose 175 mg/m2, Cycle 5, Reason: Dose Not Tolerated) Administration: 294 mg (10/03/2018), 294 mg (10/23/2018), 294 mg (11/13/2018), 294 mg (12/05/2018), 222 mg (12/26/2018), 222 mg (01/15/2019)  for chemotherapy treatment.      REVIEW OF SYSTEMS:   Constitutional: Denies fevers, chills or abnormal weight loss Eyes: Denies blurriness of vision Ears, nose, mouth, throat, and face: Denies mucositis or sore throat Respiratory: Denies cough, dyspnea or wheezes Cardiovascular: Denies palpitation, chest discomfort or lower extremity swelling Gastrointestinal:  Denies nausea, heartburn or change in bowel habits Skin: Denies abnormal skin rashes Lymphatics: Denies new lymphadenopathy or easy bruising Neurological:Denies numbness, tingling or new weaknesses Behavioral/Psych: Mood is stable, no new changes  All other systems were reviewed with the patient and are negative.  I have reviewed the past medical history, past surgical history, social history and family history with the patient and they are unchanged from previous note.  ALLERGIES:  is allergic to benadryl [diphenhydramine].  MEDICATIONS:  Current Outpatient Medications  Medication Sig Dispense Refill  . acetaminophen (TYLENOL) 325 MG  tablet Take 650 mg by mouth every 6 (six) hours as needed.    . ALPRAZolam (XANAX) 0.5 MG tablet Take 1 tablet (0.5 mg total) by mouth at bedtime as needed for anxiety. 60 tablet 0  . amLODipine (NORVASC) 10 MG tablet TAKE 1 TABLET(10 MG) BY MOUTH DAILY 30 tablet 3  . amLODipine (NORVASC) 5 MG tablet Take 1 tablet (5 mg total) by mouth daily. TAKE 1 TABLET(10 MG) BY MOUTH DAILY 90 tablet 9  . dexamethasone (DECADRON) 4 MG tablet Take 2 tabs at the night before and 2 tab the morning of chemotherapy, every 3 weeks, by mouth 12 tablet 0  . levothyroxine (SYNTHROID, LEVOTHROID) 25 MCG tablet Take 1 tablet (25 mcg total) by mouth daily before breakfast. 30 tablet 1  . lidocaine-prilocaine (EMLA) cream Apply to affected area once 30 g 3  . magnesium oxide (MAG-OX) 400 (241.3 Mg) MG tablet Take 1 tablet (400 mg total) by mouth 2 (two) times daily. 60 tablet 6  . morphine (MSIR) 15 MG tablet Take 1 tablet (15 mg total) by mouth every 4 (four) hours as needed for severe pain. 60 tablet 0  . ondansetron (ZOFRAN) 8 MG tablet Take 1 tablet (8 mg total) by mouth every 8 (eight) hours as needed for nausea. 30 tablet 3  . prochlorperazine (COMPAZINE) 10 MG tablet Take 1 tablet (10 mg total) by mouth every 6 (six) hours as needed for nausea or vomiting. 30 tablet 0  . sertraline (ZOLOFT) 25 MG tablet TAKE 1 TABLET BY MOUTH EVERY DAY 30 tablet 9   No current facility-administered medications for this visit.    Facility-Administered Medications Ordered in Other Visits  Medication Dose Route  Frequency Provider Last Rate Last Dose  . CARBOplatin (PARAPLATIN) 450 mg in sodium chloride 0.9 % 250 mL chemo infusion  450 mg Intravenous Once Alvy Bimler, Jalil Lorusso, MD      . heparin lock flush 100 unit/mL  500 Units Intracatheter Once PRN Alvy Bimler, Wagner Tanzi, MD      . loratadine (CLARITIN) tablet 10 mg  10 mg Oral Once Alvy Bimler, Quinnley Colasurdo, MD      . PACLitaxel (TAXOL) 222 mg in sodium chloride 0.9 % 250 mL chemo infusion (> 59m/m2)  131.25 mg/m2  (Treatment Plan Recorded) Intravenous Once Carolene Gitto, MD      . sodium chloride flush (NS) 0.9 % injection 10 mL  10 mL Intracatheter PRN GAlvy Bimler Rohin Krejci, MD        PHYSICAL EXAMINATION: ECOG PERFORMANCE STATUS: 1 - Symptomatic but completely ambulatory  Vitals:   02/06/19 1021  BP: 131/69  Pulse: 99  Resp: 18  Temp: 98.7 F (37.1 C)  SpO2: 100%   Filed Weights   02/06/19 1021  Weight: 138 lb (62.6 kg)    GENERAL:alert, no distress and comfortable SKIN: skin color, texture, turgor are normal, no rashes or significant lesions EYES: normal, Conjunctiva are pink and non-injected, sclera clear OROPHARYNX:no exudate, no erythema and lips, buccal mucosa, and tongue normal  NECK: supple, thyroid normal size, non-tender, without nodularity LYMPH:  no palpable lymphadenopathy in the cervical, axillary or inguinal LUNGS: clear to auscultation and percussion with normal breathing effort HEART: regular rate & rhythm and no murmurs and no lower extremity edema ABDOMEN:abdomen soft, non-tender and normal bowel sounds Musculoskeletal:no cyanosis of digits and no clubbing  NEURO: alert & oriented x 3 with fluent speech, no focal motor/sensory deficits  LABORATORY DATA:  I have reviewed the data as listed    Component Value Date/Time   NA 140 02/06/2019 0916   K 4.0 02/06/2019 0916   CL 101 02/06/2019 0916   CO2 29 02/06/2019 0916   GLUCOSE 107 (H) 02/06/2019 0916   BUN 9 02/06/2019 0916   CREATININE 0.64 02/06/2019 0916   CREATININE 0.85 02/20/2018 0850   CALCIUM 9.1 02/06/2019 0916   PROT 7.2 02/06/2019 0916   ALBUMIN 3.9 02/06/2019 0916   AST 28 02/06/2019 0916   AST 22 02/20/2018 0850   ALT 6 02/06/2019 0916   ALT 19 02/20/2018 0850   ALKPHOS 106 02/06/2019 0916   BILITOT 0.4 02/06/2019 0916   BILITOT 0.6 02/20/2018 0850   GFRNONAA >60 02/06/2019 0916   GFRNONAA >60 02/20/2018 0850   GFRAA >60 02/06/2019 0916   GFRAA >60 02/20/2018 0850    No results found for: SPEP,  UPEP  Lab Results  Component Value Date   WBC 5.3 02/06/2019   NEUTROABS 4.7 02/06/2019   HGB 9.6 (L) 02/06/2019   HCT 29.0 (L) 02/06/2019   MCV 99.7 02/06/2019   PLT 78 (L) 02/06/2019      Chemistry      Component Value Date/Time   NA 140 02/06/2019 0916   K 4.0 02/06/2019 0916   CL 101 02/06/2019 0916   CO2 29 02/06/2019 0916   BUN 9 02/06/2019 0916   CREATININE 0.64 02/06/2019 0916   CREATININE 0.85 02/20/2018 0850      Component Value Date/Time   CALCIUM 9.1 02/06/2019 0916   ALKPHOS 106 02/06/2019 0916   AST 28 02/06/2019 0916   AST 22 02/20/2018 0850   ALT 6 02/06/2019 0916   ALT 19 02/20/2018 0850   BILITOT 0.4 02/06/2019 0916   BILITOT  0.6 02/20/2018 0850     I have reviewed outside records  All questions were answered. The patient knows to call the clinic with any problems, questions or concerns. No barriers to learning was detected.  I spent 30 minutes counseling the patient face to face. The total time spent in the appointment was 40 minutes and more than 50% was on counseling and review of test results  Heath Lark, MD 02/06/2019 12:20 PM

## 2019-02-06 NOTE — Assessment & Plan Note (Signed)
She has mild worsening pancytopenia from treatment We will proceed with treatment without delay but with further dose adjustment of carboplatin I also plan to give her an extra week of break from treatment prior to her next cycle of therapy She agreed with the plan of care

## 2019-02-06 NOTE — Assessment & Plan Note (Signed)
Her cancer pain is improving with IR morphine She will continue the same I warned her about risk of constipation

## 2019-02-09 ENCOUNTER — Other Ambulatory Visit: Payer: Self-pay | Admitting: Oncology

## 2019-02-09 ENCOUNTER — Ambulatory Visit
Admission: RE | Admit: 2019-02-09 | Discharge: 2019-02-09 | Disposition: A | Discharge status: Home / Self Care | Payer: Self-pay | Source: Ambulatory Visit | Attending: Hematology and Oncology | Admitting: Hematology and Oncology

## 2019-02-09 DIAGNOSIS — C530 Malignant neoplasm of endocervix: Secondary | ICD-10-CM

## 2019-02-16 MED ORDER — DIPHENHYDRAMINE HCL 25 MG PO CAPS
ORAL_CAPSULE | ORAL | Status: AC
  Filled 2019-02-16: qty 1

## 2019-03-04 ENCOUNTER — Telehealth: Payer: Self-pay

## 2019-03-04 ENCOUNTER — Other Ambulatory Visit: Payer: Self-pay | Admitting: Hematology and Oncology

## 2019-03-04 MED ORDER — MORPHINE SULFATE 15 MG PO TABS: 15.0000 mg | ORAL_TABLET | ORAL | 0 refills | Status: AC | PRN

## 2019-03-04 NOTE — Telephone Encounter (Signed)
lvm with pt informing her that prescription for morphine has been sent to her pharmacy

## 2019-03-06 ENCOUNTER — Inpatient Hospital Stay (HOSPITAL_BASED_OUTPATIENT_CLINIC_OR_DEPARTMENT_OTHER): Payer: Medicaid Other | Admitting: Hematology and Oncology

## 2019-03-06 ENCOUNTER — Inpatient Hospital Stay: Payer: Medicaid Other

## 2019-03-06 ENCOUNTER — Encounter: Payer: Self-pay | Admitting: Hematology and Oncology

## 2019-03-06 ENCOUNTER — Other Ambulatory Visit: Payer: Self-pay

## 2019-03-06 ENCOUNTER — Inpatient Hospital Stay: Payer: Medicaid Other | Attending: Hematology and Oncology

## 2019-03-06 DIAGNOSIS — C530 Malignant neoplasm of endocervix: Secondary | ICD-10-CM

## 2019-03-06 DIAGNOSIS — D61810 Antineoplastic chemotherapy induced pancytopenia: Secondary | ICD-10-CM | POA: Insufficient documentation

## 2019-03-06 DIAGNOSIS — C539 Malignant neoplasm of cervix uteri, unspecified: Secondary | ICD-10-CM | POA: Insufficient documentation

## 2019-03-06 DIAGNOSIS — C787 Secondary malignant neoplasm of liver and intrahepatic bile duct: Secondary | ICD-10-CM

## 2019-03-06 DIAGNOSIS — K5909 Other constipation: Secondary | ICD-10-CM | POA: Insufficient documentation

## 2019-03-06 DIAGNOSIS — G893 Neoplasm related pain (acute) (chronic): Secondary | ICD-10-CM

## 2019-03-06 DIAGNOSIS — R634 Abnormal weight loss: Secondary | ICD-10-CM

## 2019-03-06 DIAGNOSIS — Z79899 Other long term (current) drug therapy: Secondary | ICD-10-CM | POA: Diagnosis not present

## 2019-03-06 DIAGNOSIS — D61818 Other pancytopenia: Secondary | ICD-10-CM | POA: Diagnosis not present

## 2019-03-06 DIAGNOSIS — K089 Disorder of teeth and supporting structures, unspecified: Secondary | ICD-10-CM

## 2019-03-06 DIAGNOSIS — T451X5A Adverse effect of antineoplastic and immunosuppressive drugs, initial encounter: Secondary | ICD-10-CM | POA: Diagnosis not present

## 2019-03-06 DIAGNOSIS — R5383 Other fatigue: Secondary | ICD-10-CM

## 2019-03-06 LAB — CBC WITH DIFFERENTIAL/PLATELET
Abs Immature Granulocytes: 0.01 10*3/uL (ref 0.00–0.07)
Basophils Absolute: 0 10*3/uL (ref 0.0–0.1)
Basophils Relative: 0 %
Eosinophils Absolute: 0 10*3/uL (ref 0.0–0.5)
Eosinophils Relative: 0 %
HCT: 26.3 % — ABNORMAL LOW (ref 36.0–46.0)
Hemoglobin: 8.5 g/dL — ABNORMAL LOW (ref 12.0–15.0)
Immature Granulocytes: 0 %
Lymphocytes Relative: 23 %
Lymphs Abs: 1.1 10*3/uL (ref 0.7–4.0)
MCH: 32.8 pg (ref 26.0–34.0)
MCHC: 32.3 g/dL (ref 30.0–36.0)
MCV: 101.5 fL — ABNORMAL HIGH (ref 80.0–100.0)
Monocytes Absolute: 0.3 10*3/uL (ref 0.1–1.0)
Monocytes Relative: 7 %
Neutro Abs: 3.3 10*3/uL (ref 1.7–7.7)
Neutrophils Relative %: 70 %
Platelets: 90 10*3/uL — ABNORMAL LOW (ref 150–400)
RBC: 2.59 MIL/uL — ABNORMAL LOW (ref 3.87–5.11)
RDW: 15.9 % — ABNORMAL HIGH (ref 11.5–15.5)
WBC: 4.7 10*3/uL (ref 4.0–10.5)
nRBC: 0 % (ref 0.0–0.2)

## 2019-03-06 LAB — COMPREHENSIVE METABOLIC PANEL
ALT: 9 U/L (ref 0–44)
AST: 25 U/L (ref 15–41)
Albumin: 3.7 g/dL (ref 3.5–5.0)
Alkaline Phosphatase: 138 U/L — ABNORMAL HIGH (ref 38–126)
Anion gap: 12 (ref 5–15)
BUN: 5 mg/dL — ABNORMAL LOW (ref 6–20)
CO2: 25 mmol/L (ref 22–32)
Calcium: 9.3 mg/dL (ref 8.9–10.3)
Chloride: 104 mmol/L (ref 98–111)
Creatinine, Ser: 0.72 mg/dL (ref 0.44–1.00)
GFR calc Af Amer: >60 mL/min (ref >60)
GFR calc non Af Amer: >60 mL/min (ref >60)
Glucose, Bld: 100 mg/dL — ABNORMAL HIGH (ref 70–99)
Potassium: 3.8 mmol/L (ref 3.5–5.1)
Sodium: 141 mmol/L (ref 135–145)
Total Bilirubin: 0.3 mg/dL (ref 0.3–1.2)
Total Protein: 7.1 g/dL (ref 6.5–8.1)

## 2019-03-06 LAB — TSH: TSH: 4.957 u[IU]/mL — ABNORMAL HIGH (ref 0.308–3.960)

## 2019-03-06 MED ORDER — SODIUM CHLORIDE 0.9% FLUSH
10.0000 mL | Freq: Once | INTRAVENOUS | Status: AC
  Administered 2019-03-06: 10 mL
  Filled 2019-03-06: qty 10

## 2019-03-06 NOTE — Assessment & Plan Note (Signed)
She is attempting to get possible dental extraction next week

## 2019-03-06 NOTE — Assessment & Plan Note (Signed)
She has profound weight loss since the last time I saw her We discussed the importance of increase oral intake as tolerated and I will adjust the dose of her chemotherapy accordingly

## 2019-03-06 NOTE — Assessment & Plan Note (Signed)
Her cancer pain is improving with IR morphine She will continue the same I warned her about risk of constipation

## 2019-03-06 NOTE — Progress Notes (Signed)
[facility] OFF[facility] PROGRESS NOTE  Patient Care Team: Avel Sensor, MD as PCP - General (Obstetrics and Gynecology)  ASSESSMENT & PLAN:  Cervical cancer [facility]) She is starting to experience numerous side effects from treatment Due to significant pancytopenia and feeling unwell, I recommend delaying chemotherapy for at least 1 to 2 weeks She is attempting to get dental care next week for tooth ache I recommend delaying chemo for [redacted] weeks along with dose adjustment I spent some time reviewing imaging study with the patient She continues to have positive response to treatment but due to chronic exposure to chemotherapy, she is developing multiple side effects She agreed the plan of care  Pancytopenia, acquired [facility]) She has worsening pancytopenia due to cumulative side effects of treatment As above, I plan to delay her chemotherapy by 2 weeks and dose adjustment  Other constipation She has mild worsening constipation secondary to recent regular use of narcotic prescription We discussed the importance of aggressive laxative therapy  Cancer associated pain Her cancer pain is improving with IR morphine She will continue the same I warned her about risk of constipation  Poor dentition She is attempting to get possible dental extraction next week  Weight loss, abnormal She has profound weight loss since the last time I saw her We discussed the importance of increase oral intake as tolerated and I will adjust the dose of her chemotherapy accordingly   No orders of the defined types were placed in this encounter.   INTERVAL HISTORY: Please see below for problem oriented charting. She returns for cycle 8 of chemotherapy Since last time I saw her, she has lost 10 pounds She has mild dental pain She also have constipation Her abdominal pain is stable with current narcotic prescription The patient is somewhat depressed due to her frail status She also have minimum  vaginal bleeding recently after chemo She denies worsening peripheral neuropathy No recent fever or chills  SUMMARY OF ONCOLOGIC HISTORY: Oncology History Overview Note  She tested positive for PD-L1 25% from liver biopsy  Progressed bevacizumab and keytruda   Cervical cancer ([place])  07/22/2017 Initial Diagnosis   The patient was examined in the wellness center.  Upon vaginal exam, friable mass of tissue presumably from the cervix was interpreted as poorly differentiated high-grade malignant neoplasm.  Follow-up workup with ultrasound of the uterus revealed multiple large uterine fibroids.   07/22/2017 Pathology Results   Biopsy confirmed poorly differentiated high-grade malignant neoplasm.   08/06/2017 Imaging   6.6 cm uterine mass in area of cervix and upper vagina, highly suspicious for cervical carcinoma.  Mild bilateral iliac lymphadenopathy, consistent with metastatic disease. No abdominal lymphadenopathy identified.  Multiple large liver masses, with areas of central necrosis or scarring, highly suspicious for liver metastases. Although unlikely, it is conceivable that some of these lesions could represent other etiology such as focal nodular hyperplasia or adenomas. Consider further evaluation with PET-CT, abdomen MRI without and with contrast, or percutaneous needle biopsy.  Multiple large uterine fibroids, many showing cystic degeneration.  Bilateral cystic adnexal lesions with probably benign features. Differential diagnosis includes ovarian cysts, endometriomas, and hydrosalpinges. These could also be further assessed on PET-CT or MRI.  No evidence of thoracic metastatic disease.    08/09/2017 Tumor Marker   Patient's tumor was tested for the following markers: CA-125 Results of the tumor marker test revealed 79.2   08/14/2017 Pathology Results   Liver, needle/core biopsy, left hepatic lobe - METASTATIC POORLY DIFFERENTIATED CARCINOMA, SEE COMMENT.  Microscopic  Comment The tumor is poorly differentiated with abundant necrosis. Pancytokeratin, PAX-8, cytokeratin 7, and p16 are positive. There is weak non-specific staining with TTF-1, synaptophysin (very focal), p63 (very focal). Chromogranin, CD56, S100, desmin, SMA, ER, CD10, cytokeratin 10, cytokeratin 5/6, and cytokeratin 20 are negative. Thus, the immunoprofile is suggest of a gynecologic primary. Dr. Lyndon Code has reviewed the case.    08/21/2017 Procedure   Ultrasound and fluoroscopically guided right internal jugular single lumen power port catheter insertion. Tip in the SVC/RA junction. Catheter ready for use.   08/30/2017 - 01/03/2018 Chemotherapy   The patient had received cisplatin and Taxol x 6 cycles   09/12/2017 - 06/26/2018 Chemotherapy   The patient had bevacizumab for chemotherapy treatment.    10/24/2017 Imaging   Interval resolution of large cervical mass and bilateral iliac lymphadenopathy since previous study.  Significant decrease in size of diffuse liver metastases since prior study. No new or progressive metastatic disease identified.  Stable probably benign cystic lesion in right adnexa. Previously seen left adnexal cystic lesion has resolved since prior study.  Stable large uterine fibroids. Increased mild right hydronephrosis due to extrinsic compression of ureter by enlarged uterus.   02/20/2018 Imaging   Interval improvement in diffuse liver metastases since prior study. No new or progressive metastatic disease.  Stable large uterine fibroids.  No significant change in tubular cystic lesion in right adnexa, suspicious for hydrosalpinx or endometrioma.   06/05/2018 Imaging   CT AP W Contrast 06/05/18 IMPRESSION: New mild lymphadenopathy in left paraaortic region and porta hepatis, and slight increase in size of right external iliac lymph node, consistent with metastatic disease.  Increased soft tissue prominence in region of cervix and right vaginal cuff, consistent with local  tumor progression.  Multiple liver metastases, with mild decrease in size of several metastatic lesions.  Stable uterine fibroids.   06/27/2018 - 08/29/2018 Chemotherapy   The patient had Keytruda   09/19/2018 Imaging   1. Increased size of large masses in the cervix and lower uterine segment, consistent with local recurrence of cervical carcinoma. This results in new moderate hydrometros. 2. Increased abdominal and pelvic lymphadenopathy, consistent with metastatic disease. 3. Markedly increased size of a central right hepatic lobe metastasis. Other smaller liver metastases show no significant change. 4. Stable large uterine fibroids.    10/03/2018 -  Chemotherapy   The patient had carboplatin and taxol    12/04/2018 Imaging   1. Interval decrease in size of liver metastases since previous study. 2. Resolution of porta hepatis lymphadenopathy since prior study. 3. No new or progressive metastatic disease. 4. Stable large calcified uterine fibroids.    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([place])  08/09/2017 Initial Diagnosis   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[facility])   09/12/2017 - 06/26/2018 Chemotherapy   The patient had bevacizumab (AVASTIN) 800 mg in sodium chloride 0.9 % 100 mL chemo infusion, 15 mg/kg = 800 mg, Intravenous,  Once, 10 of 10 cycles Dose modification: 15 mg/kg (original dose 15 mg/kg, Cycle 8, Reason: Provider Judgment), 15 mg/kg (original dose 15 mg/kg, Cycle 9, Reason: Other (see comments)) Administration: 800 mg (09/20/2017), 800 mg (11/01/2017), 800 mg (11/22/2017), 800 mg (12/13/2017), 800 mg (02/21/2018), 800 mg (04/04/2018), 1,000 mg (04/25/2018), 1,000 mg (05/16/2018), 1,000 mg (06/06/2018)  for chemotherapy treatment.    06/27/2018 - 09/21/2018 Chemotherapy   The patient had pembrolizumab (KEYTRUDA) 200 mg in sodium chloride 0.9 % 50 mL chemo infusion, 200 mg, Intravenous, Once, 4 of 5 cycles Administration: 200 mg (06/27/2018), 200  mg (07/18/2018), 200 mg (08/08/2018), 200 mg (08/29/2018)  for  chemotherapy treatment.    10/03/2018 -  Chemotherapy   The patient had palonosetron (ALOXI) injection 0.25 mg, 0.25 mg, Intravenous,  Once, 7 of 9 cycles Administration: 0.25 mg (10/03/2018), 0.25 mg (10/23/2018), 0.25 mg (11/13/2018), 0.25 mg (12/05/2018), 0.25 mg (12/26/2018), 0.25 mg (01/15/2019), 0.25 mg (02/06/2019) CARBOplatin (PARAPLATIN) 540 mg in sodium chloride 0.9 % 250 mL chemo infusion, 540 mg (98.1 % of original dose 549 mg), Intravenous,  Once, 7 of 9 cycles Dose modification:   (original dose 549 mg, Cycle 1), 500 mg (original dose 549 mg, Cycle 6, Reason: Dose not tolerated), 450 mg (original dose 549 mg, Cycle 7, Reason: Dose not tolerated), 366 mg (66.7 % of original dose 549 mg, Cycle 8, Reason: Dose Not Tolerated) Administration: 540 mg (10/03/2018), 540 mg (10/23/2018), 540 mg (11/13/2018), 540 mg (12/05/2018), 550 mg (12/26/2018), 540 mg (01/15/2019), 450 mg (02/06/2019) PACLitaxel (TAXOL) 294 mg in sodium chloride 0.9 % 250 mL chemo infusion (> 62m/m2), 175 mg/m2 = 294 mg, Intravenous,  Once, 7 of 9 cycles Dose modification: 131.25 mg/m2 (75 % of original dose 175 mg/m2, Cycle 5, Reason: Dose Not Tolerated), 105 mg/m2 (60 % of original dose 175 mg/m2, Cycle 8, Reason: Dose Not Tolerated) Administration: 294 mg (10/03/2018), 294 mg (10/23/2018), 294 mg (11/13/2018), 294 mg (12/05/2018), 222 mg (12/26/2018), 222 mg (01/15/2019), 222 mg (02/06/2019)  for chemotherapy treatment.      REVIEW OF SYSTEMS:   Constitutional: Denies fevers, chills or abnormal weight loss Eyes: Denies blurriness of vision Ears, nose, mouth, throat, and face: Denies mucositis or sore throat Respiratory: Denies cough, dyspnea or wheezes Cardiovascular: Denies palpitation, chest discomfort or lower extremity swelling Gastrointestinal:  Denies nausea, heartburn or change in bowel habits Skin: Denies abnormal skin rashes Lymphatics: Denies new lymphadenopathy or easy bruising Neurological:Denies numbness, tingling or new  weaknesses Behavioral/Psych: Mood is stable, no new changes  All other systems were reviewed with the patient and are negative.  I have reviewed the past medical history, past surgical history, social history and family history with the patient and they are unchanged from previous note.  ALLERGIES:  is allergic to benadryl [diphenhydramine].  MEDICATIONS:  Current Outpatient Medications  Medication Sig Dispense Refill  . acetaminophen (TYLENOL) 325 MG tablet Take 650 mg by mouth every 6 (six) hours as needed.    . ALPRAZolam (XANAX) 0.5 MG tablet Take 1 tablet (0.5 mg total) by mouth at bedtime as needed for anxiety. 60 tablet 0  . amLODipine (NORVASC) 10 MG tablet TAKE 1 TABLET(10 MG) BY MOUTH DAILY 30 tablet 3  . amLODipine (NORVASC) 5 MG tablet Take 1 tablet (5 mg total) by mouth daily. TAKE 1 TABLET(10 MG) BY MOUTH DAILY 90 tablet 9  . dexamethasone (DECADRON) 4 MG tablet Take 2 tabs at the night before and 2 tab the morning of chemotherapy, every 3 weeks, by mouth 12 tablet 0  . levothyroxine (SYNTHROID, LEVOTHROID) 25 MCG tablet Take 1 tablet (25 mcg total) by mouth daily before breakfast. 30 tablet 1  . lidocaine-prilocaine (EMLA) cream Apply to affected area once 30 g 3  . magnesium oxide (MAG-OX) 400 (241.3 Mg) MG tablet Take 1 tablet (400 mg total) by mouth 2 (two) times daily. 60 tablet 6  . morphine (MSIR) 15 MG tablet Take 1 tablet (15 mg total) by mouth every 4 (four) hours as needed for severe pain. 60 tablet 0  . ondansetron (ZOFRAN) 8 MG tablet Take 1  tablet (8 mg total) by mouth every 8 (eight) hours as needed for nausea. 30 tablet 3  . prochlorperazine (COMPAZINE) 10 MG tablet Take 1 tablet (10 mg total) by mouth every 6 (six) hours as needed for nausea or vomiting. 30 tablet 0  . sertraline (ZOLOFT) 25 MG tablet TAKE 1 TABLET BY MOUTH EVERY DAY 30 tablet 9   No current facility-administered medications for this visit.     PHYSICAL EXAMINATION: ECOG PERFORMANCE STATUS:  2 - Symptomatic, <50% confined to bed  Vitals:   03/06/19 1023  BP: 131/70  Pulse: (!) 107  Resp: 18  Temp: 97.8 F (36.6 C)  SpO2: 100%   Filed Weights   03/06/19 1023  Weight: 128 lb 9.6 oz (58.3 kg)    GENERAL:alert, no distress and comfortable SKIN: skin color, texture, turgor are normal, no rashes or significant lesions EYES: normal, Conjunctiva are pink and non-injected, sclera clear OROPHARYNX:no exudate, no erythema and lips, buccal mucosa, and tongue normal  NECK: supple, thyroid normal size, non-tender, without nodularity LYMPH:  no palpable lymphadenopathy in the cervical, axillary or inguinal LUNGS: clear to auscultation and percussion with normal breathing effort HEART: regular rate & rhythm and no murmurs and no lower extremity edema ABDOMEN:abdomen soft, non-tender and normal bowel sounds Musculoskeletal:no cyanosis of digits and no clubbing  NEURO: alert & oriented x 3 with fluent speech, no focal motor/sensory deficits  LABORATORY DATA:  I have reviewed the data as listed    Component Value Date/Time   NA 141 03/06/2019 1010   K 3.8 03/06/2019 1010   CL 104 03/06/2019 1010   CO2 25 03/06/2019 1010   GLUCOSE 100 (H) 03/06/2019 1010   BUN 5 (L) 03/06/2019 1010   CREATININE 0.72 03/06/2019 1010   CREATININE 0.85 02/20/2018 0850   CALCIUM 9.3 03/06/2019 1010   PROT 7.1 03/06/2019 1010   ALBUMIN 3.7 03/06/2019 1010   AST 25 03/06/2019 1010   AST 22 02/20/2018 0850   ALT 9 03/06/2019 1010   ALT 19 02/20/2018 0850   ALKPHOS 138 (H) 03/06/2019 1010   BILITOT 0.3 03/06/2019 1010   BILITOT 0.6 02/20/2018 0850   GFRNONAA >60 03/06/2019 1010   GFRNONAA >60 02/20/2018 0850   GFRAA >60 03/06/2019 1010   GFRAA >60 02/20/2018 0850    No results found for: SPEP, UPEP  Lab Results  Component Value Date   WBC 4.7 03/06/2019   NEUTROABS 3.3 03/06/2019   HGB 8.5 (L) 03/06/2019   HCT 26.3 (L) 03/06/2019   MCV 101.5 (H) 03/06/2019   PLT 90 (L) 03/06/2019       Chemistry      Component Value Date/Time   NA 141 03/06/2019 1010   K 3.8 03/06/2019 1010   CL 104 03/06/2019 1010   CO2 25 03/06/2019 1010   BUN 5 (L) 03/06/2019 1010   CREATININE 0.72 03/06/2019 1010   CREATININE 0.85 02/20/2018 0850      Component Value Date/Time   CALCIUM 9.3 03/06/2019 1010   ALKPHOS 138 (H) 03/06/2019 1010   AST 25 03/06/2019 1010   AST 22 02/20/2018 0850   ALT 9 03/06/2019 1010   ALT 19 02/20/2018 0850   BILITOT 0.3 03/06/2019 1010   BILITOT 0.6 02/20/2018 0850     I have reviewed recent CT imaging and the measurements of her disease which has improved All questions were answered. The patient knows to call the clinic with any problems, questions or concerns. No barriers to learning was detected.  I spent 25 minutes counseling the patient face to face. The total time spent in the appointment was 40 minutes and more than 50% was on counseling and review of test results  Heath Lark, MD 03/06/2019 3:27 PM

## 2019-03-06 NOTE — Assessment & Plan Note (Signed)
She has mild worsening constipation secondary to recent regular use of narcotic prescription We discussed the importance of aggressive laxative therapy

## 2019-03-06 NOTE — Assessment & Plan Note (Signed)
She has worsening pancytopenia due to cumulative side effects of treatment As above, I plan to delay her chemotherapy by 2 weeks and dose adjustment

## 2019-03-06 NOTE — Assessment & Plan Note (Signed)
She is starting to experience numerous side effects from treatment Due to significant pancytopenia and feeling unwell, I recommend delaying chemotherapy for at least 1 to 2 weeks She is attempting to get dental care next week for tooth ache I recommend delaying chemo for [redacted] weeks along with dose adjustment I spent some time reviewing imaging study with the patient She continues to have positive response to treatment but due to chronic exposure to chemotherapy, she is developing multiple side effects She agreed the plan of care

## 2019-03-06 NOTE — Patient Instructions (Signed)

## 2019-03-19 ENCOUNTER — Other Ambulatory Visit: Payer: Self-pay | Admitting: Hematology and Oncology

## 2019-03-19 DIAGNOSIS — C530 Malignant neoplasm of endocervix: Secondary | ICD-10-CM

## 2019-03-19 DIAGNOSIS — D61818 Other pancytopenia: Secondary | ICD-10-CM

## 2019-03-20 ENCOUNTER — Inpatient Hospital Stay: Payer: Medicaid Other

## 2019-03-20 ENCOUNTER — Other Ambulatory Visit: Payer: Self-pay

## 2019-03-20 VITALS — BP 117/67 | HR 67 | Temp 98.0°F | Resp 18

## 2019-03-20 DIAGNOSIS — D500 Iron deficiency anemia secondary to blood loss (chronic): Secondary | ICD-10-CM

## 2019-03-20 DIAGNOSIS — C787 Secondary malignant neoplasm of liver and intrahepatic bile duct: Secondary | ICD-10-CM

## 2019-03-20 DIAGNOSIS — Z7189 Other specified counseling: Secondary | ICD-10-CM

## 2019-03-20 DIAGNOSIS — C539 Malignant neoplasm of cervix uteri, unspecified: Secondary | ICD-10-CM | POA: Diagnosis not present

## 2019-03-20 DIAGNOSIS — C530 Malignant neoplasm of endocervix: Secondary | ICD-10-CM

## 2019-03-20 DIAGNOSIS — R5383 Other fatigue: Secondary | ICD-10-CM

## 2019-03-20 DIAGNOSIS — D61818 Other pancytopenia: Secondary | ICD-10-CM

## 2019-03-20 LAB — CMP (CANCER CENTER ONLY)
ALT: 10 U/L (ref 0–44)
AST: 22 U/L (ref 15–41)
Albumin: 3.3 g/dL — ABNORMAL LOW (ref 3.5–5.0)
Alkaline Phosphatase: 187 U/L — ABNORMAL HIGH (ref 38–126)
Anion gap: 13 (ref 5–15)
BUN: 7 mg/dL (ref 6–20)
CO2: 26 mmol/L (ref 22–32)
Calcium: 9 mg/dL (ref 8.9–10.3)
Chloride: 102 mmol/L (ref 98–111)
Creatinine: 0.74 mg/dL (ref 0.44–1.00)
GFR, Est AFR Am: >60 mL/min (ref >60)
GFR, Estimated: >60 mL/min (ref >60)
Glucose, Bld: 153 mg/dL — ABNORMAL HIGH (ref 70–99)
Potassium: 3.4 mmol/L — ABNORMAL LOW (ref 3.5–5.1)
Sodium: 141 mmol/L (ref 135–145)
Total Bilirubin: 0.4 mg/dL (ref 0.3–1.2)
Total Protein: 6.9 g/dL (ref 6.5–8.1)

## 2019-03-20 LAB — CBC WITH DIFFERENTIAL (CANCER CENTER ONLY)
Abs Immature Granulocytes: 0.01 10*3/uL (ref 0.00–0.07)
Basophils Absolute: 0 10*3/uL (ref 0.0–0.1)
Basophils Relative: 0 %
Eosinophils Absolute: 0 10*3/uL (ref 0.0–0.5)
Eosinophils Relative: 0 %
HCT: 23.3 % — ABNORMAL LOW (ref 36.0–46.0)
Hemoglobin: 7.2 g/dL — ABNORMAL LOW (ref 12.0–15.0)
Immature Granulocytes: 0 %
Lymphocytes Relative: 15 %
Lymphs Abs: 0.7 10*3/uL (ref 0.7–4.0)
MCH: 32.1 pg (ref 26.0–34.0)
MCHC: 30.9 g/dL (ref 30.0–36.0)
MCV: 104 fL — ABNORMAL HIGH (ref 80.0–100.0)
Monocytes Absolute: 0.3 10*3/uL (ref 0.1–1.0)
Monocytes Relative: 6 %
Neutro Abs: 3.8 10*3/uL (ref 1.7–7.7)
Neutrophils Relative %: 79 %
Platelet Count: 106 10*3/uL — ABNORMAL LOW (ref 150–400)
RBC: 2.24 MIL/uL — ABNORMAL LOW (ref 3.87–5.11)
RDW: 15.9 % — ABNORMAL HIGH (ref 11.5–15.5)
WBC Count: 4.8 10*3/uL (ref 4.0–10.5)
nRBC: 0 % (ref 0.0–0.2)

## 2019-03-20 LAB — SAMPLE TO BLOOD BANK

## 2019-03-20 LAB — PREPARE RBC (CROSSMATCH)

## 2019-03-20 LAB — TSH: TSH: 3.964 u[IU]/mL — ABNORMAL HIGH (ref 0.308–3.960)

## 2019-03-20 MED ORDER — SODIUM CHLORIDE 0.9% IV SOLUTION
250.0000 mL | Freq: Once | INTRAVENOUS | Status: AC
  Administered 2019-03-20: 250 mL via INTRAVENOUS
  Filled 2019-03-20: qty 250

## 2019-03-20 MED ORDER — SODIUM CHLORIDE 0.9% FLUSH
10.0000 mL | Freq: Once | INTRAVENOUS | Status: AC
  Administered 2019-03-20: 10 mL
  Filled 2019-03-20: qty 10

## 2019-03-20 MED ORDER — SODIUM CHLORIDE 0.9% FLUSH
10.0000 mL | INTRAVENOUS | Status: AC | PRN
  Administered 2019-03-20: 10 mL
  Filled 2019-03-20: qty 10

## 2019-03-20 MED ORDER — HEPARIN SOD (PORK) LOCK FLUSH 100 UNIT/ML IV SOLN
250.0000 [IU] | INTRAVENOUS | Status: DC | PRN
  Filled 2019-03-20: qty 5

## 2019-03-20 MED ORDER — SODIUM CHLORIDE 0.9% FLUSH
3.0000 mL | INTRAVENOUS | Status: DC | PRN
  Filled 2019-03-20: qty 10

## 2019-03-20 MED ORDER — ACETAMINOPHEN 325 MG PO TABS
650.0000 mg | ORAL_TABLET | Freq: Once | ORAL | Status: AC
  Administered 2019-03-20: 650 mg via ORAL

## 2019-03-20 MED ORDER — ACETAMINOPHEN 325 MG PO TABS
ORAL_TABLET | ORAL | Status: AC
  Filled 2019-03-20: qty 2

## 2019-03-20 MED ORDER — DIPHENHYDRAMINE HCL 25 MG PO CAPS
ORAL_CAPSULE | ORAL | Status: AC
  Filled 2019-03-20: qty 2

## 2019-03-20 MED ORDER — HEPARIN SOD (PORK) LOCK FLUSH 100 UNIT/ML IV SOLN
500.0000 [IU] | Freq: Every day | INTRAVENOUS | Status: AC | PRN
  Administered 2019-03-20: 500 [IU]
  Filled 2019-03-20: qty 5

## 2019-03-20 NOTE — Patient Instructions (Signed)
Blood Transfusion, Adult A blood transfusion is a procedure in which you are given blood through an IV tube. You may need this procedure because of:  Illness.  Surgery.  Injury. The blood may come from someone else (a donor). You may also be able to donate blood for yourself (autologous blood donation). The blood given in a transfusion is made up of different types of cells. You may get:  Red blood cells. These carry oxygen to the cells in the body.  White blood cells. These help you fight infections.  Platelets. These help your blood to clot.  Plasma. This is the liquid part of your blood. It helps with fluid imbalances. If you have a clotting disorder, you may also get other types of blood products. What happens before the procedure?  You will have a blood test to find out your blood type. The test also finds out what type of blood your body will accept and matches it to the donor type.  If you are going to have a planned surgery, you may be able to donate your own blood. This may be done in case you need a transfusion.  If you have had an allergic reaction to a transfusion in the past, you may be given medicine to help prevent a reaction. This medicine may be given to you by mouth or through an IV.  You will have your temperature, blood pressure, and pulse checked.  Follow instructions from your doctor about what you cannot eat or drink.  Ask your doctor about: ? Changing or stopping your regular medicines. This is important if you take diabetes medicines or blood thinners. ? Taking medicines such as aspirin and ibuprofen. These medicines can thin your blood. Do not take these medicines before your procedure if your doctor tells you not to. What happens during the procedure?  An IV tube will be put into one of your veins.  The bag of donated blood will be attached to your IV tube. Then, the blood will enter through your vein.  Your temperature, blood pressure, and pulse will  be checked regularly during the procedure. This is done to find early signs of a transfusion reaction.  If you have any signs or symptoms of a reaction, your transfusion will be stopped. You may also be given medicine.  When the transfusion is done, your IV tube will be taken out.  Pressure may be applied to the IV site for a few minutes.  A bandage (dressing) will be put on the IV site. The procedure may vary among doctors and hospitals. What happens after the procedure?  Your temperature, blood pressure, heart rate, breathing rate, and blood oxygen level will be checked often.  Your blood may be tested to see how you are responding to the transfusion.  You may be warmed with fluids or blankets. This is done to keep the temperature of your body normal. Summary  A blood transfusion is a procedure in which you are given blood through an IV tube.  The blood may come from someone else (a donor). You may also be able to donate blood for yourself.  If you have had an allergic reaction to a transfusion in the past, you may be given medicine to help prevent a reaction. This medicine may be given to you by mouth or through an IV tube.  Your temperature, blood pressure, heart rate, breathing rate, and blood oxygen level will be checked often.  Your blood may be tested to see   how you are responding to the transfusion. This information is not intended to replace advice given to you by your health care provider. Make sure you discuss any questions you have with your health care provider. Document Released: 10/05/2008 Document Revised: 08/29/2016 Document Reviewed: 03/02/2016 Elsevier Patient Education  2020 [employer]  

## 2019-03-20 NOTE — Progress Notes (Signed)
Okay to treat today with hemoglobin 7.2 per Dr. Alvy Bimler. Will scheudule blood transfusion.  Stephanie Fuentes does not want treatment today. Per Dr. Alvy Bimler, 2 units of blood ordered for today and scheduling message sent to reschedule to next Friday.

## 2019-03-20 NOTE — Patient Instructions (Signed)

## 2019-03-21 LAB — BPAM RBC
Blood Product Expiration Date: 202009232359
Blood Product Expiration Date: 202009232359
ISSUE DATE / TIME: 202008281126
ISSUE DATE / TIME: 202008281126
Unit Type and Rh: 5100
Unit Type and Rh: 5100

## 2019-03-21 LAB — TYPE AND SCREEN
ABO/RH(D): O POS
Antibody Screen: NEGATIVE
Unit division: 0
Unit division: 0

## 2019-03-23 ENCOUNTER — Telehealth: Payer: Self-pay

## 2019-03-23 ENCOUNTER — Other Ambulatory Visit: Payer: Self-pay | Admitting: Hematology and Oncology

## 2019-03-23 DIAGNOSIS — G893 Neoplasm related pain (acute) (chronic): Secondary | ICD-10-CM

## 2019-03-23 DIAGNOSIS — C530 Malignant neoplasm of endocervix: Secondary | ICD-10-CM

## 2019-03-23 MED ORDER — MORPHINE SULFATE ER 30 MG PO TBCR: 30.0000 mg | EXTENDED_RELEASE_TABLET | Freq: Two times a day (BID) | ORAL | 0 refills | Status: AC

## 2019-03-23 NOTE — Telephone Encounter (Signed)
-----   Message from Heath Lark, MD sent at 03/23/2019  8:21 AM EDT ----- Regarding: chemo appt She had delayed her chemo recently and only received blood on Friday Her next appt is on 9/18. Does she want her chemo sooner or just leave it as is?

## 2019-03-23 NOTE — Telephone Encounter (Signed)
Spoke with pt by phone and gave below msg. Pt was tearful and stated "it's just the pain". Pt advised to go to the ER for immediate pain control if her pain is intolerable.  Pt states "ok".

## 2019-03-23 NOTE — Telephone Encounter (Signed)
-----   Message from Heath Lark, MD sent at 03/23/2019 11:24 AM EDT ----- Regarding: RE: chemo appt I will add long acting MS Contin ----- Message ----- From: Paulla Dolly, RN Sent: 03/23/2019  11:18 AM EDT To: Heath Lark, MD Subject: RE: chemo appt                                 She called back and said that she wants to leave appts as is. Also, her pain is not controlled.  She states she is taking the morphine every 4 hrs + tylenol and not helping.  Please advise ----- Message ----- From: Heath Lark, MD Sent: 03/23/2019   8:21 AM EDT To: Paulla Dolly, RN Subject: chemo appt                                     She had delayed her chemo recently and only received blood on Friday Her next appt is on 9/18. Does she want her chemo sooner or just leave it as is?

## 2019-03-23 NOTE — Telephone Encounter (Signed)
Called pt and lvm with below information and requested a return call.

## 2019-03-24 ENCOUNTER — Telehealth: Payer: Self-pay | Admitting: Hematology and Oncology

## 2019-03-24 NOTE — Telephone Encounter (Signed)
Called pt per 8/28 sch message. Scheduled appt and called - no answer  . Left message for patient with appt date and time

## 2019-03-27 ENCOUNTER — Inpatient Hospital Stay: Payer: Medicaid Other

## 2019-03-27 ENCOUNTER — Telehealth: Payer: Self-pay

## 2019-03-27 ENCOUNTER — Inpatient Hospital Stay: Payer: Medicaid Other | Admitting: Hematology and Oncology

## 2019-03-27 NOTE — Telephone Encounter (Signed)
Called and left below message. Ask her to call the office back. 

## 2019-03-27 NOTE — Telephone Encounter (Signed)
-----   Message from Heath Lark, MD sent at 03/27/2019 12:51 PM EDT ----- Regarding: how is she? I thought I am seeing her today to follow-up on her pain She is no show Can you call her? Is she ok to wait until 9/18?

## 2019-04-09 ENCOUNTER — Telehealth: Payer: Self-pay | Admitting: *Deleted

## 2019-04-09 NOTE — Telephone Encounter (Signed)
Received telephone call from son. Patient passed away yesterday at [facility].

## 2019-04-10 ENCOUNTER — Inpatient Hospital Stay: Payer: Medicaid Other

## 2019-04-10 ENCOUNTER — Inpatient Hospital Stay: Payer: Medicaid Other | Admitting: Hematology and Oncology

## 2019-04-10 MED ORDER — AF-MICONAZOLE 3-DAY COMBO 200-2 MG-% (9GM) VA KIT
500.00 | PACK | VAGINAL | Status: DC
Start: 2019-04-09 — End: 2019-04-10

## 2019-04-10 MED ORDER — GENERIC EXTERNAL MEDICATION
Status: DC
Start: ? — End: 2019-04-10

## 2019-04-10 MED ORDER — DICLOXACILLIN SODIUM 62.5 MG/5ML PO SUSR
10.00 | ORAL | Status: DC
Start: ? — End: 2019-04-10

## 2019-04-10 MED ORDER — LAXATIVE PLUS STOOL SOFTENER 30-100 MG PO CAPS
4.00 | ORAL_CAPSULE | ORAL | Status: DC
Start: 2019-04-09 — End: 2019-04-10

## 2019-04-10 MED ORDER — HALENOL 160 MG/5ML OR LIQD
0.20 | ORAL | Status: DC
Start: ? — End: 2019-04-10

## 2019-04-10 MED ORDER — EQUATE NICOTINE 4 MG MT GUM
4.00 | CHEWING_GUM | OROMUCOSAL | Status: DC
Start: ? — End: 2019-04-10

## 2019-04-10 MED ORDER — CVS TUSSIN DM CLEAR PO
4.00 | ORAL | Status: DC
Start: ? — End: 2019-04-10

## 2019-04-10 MED ORDER — FRAGMIN 7500 UNIT/0.3ML ~~LOC~~ INJ
1.00 | INJECTION | SUBCUTANEOUS | Status: DC
Start: ? — End: 2019-04-10

## 2019-04-10 MED ORDER — Medication
Status: DC
Start: ? — End: 2019-04-10

## 2019-04-10 MED ORDER — CVS TUSSIN DM CLEAR PO
2.00 | ORAL | Status: DC
Start: ? — End: 2019-04-10

## 2019-04-10 MED ORDER — BARO-CAT PO
10.00 | ORAL | Status: DC
Start: ? — End: 2019-04-10

## 2019-04-10 NOTE — Telephone Encounter (Signed)
Thank you for the notification!

## 2019-04-23 DEATH — deceased

## 2019-05-19 ENCOUNTER — Encounter: Payer: Self-pay | Admitting: Hematology and Oncology
